# Patient Record
Sex: Female | Born: 1980 | Race: White | Smoking: Former smoker
Health system: Southern US, Community
[De-identification: ages and names within clinical notes are randomized; demographics above are authoritative.]

## PROBLEM LIST (undated history)

## (undated) DIAGNOSIS — R42 Dizziness and giddiness: Secondary | ICD-10-CM

## (undated) DIAGNOSIS — J449 Chronic obstructive pulmonary disease, unspecified: Secondary | ICD-10-CM

## (undated) DIAGNOSIS — F909 Attention-deficit hyperactivity disorder, unspecified type: Secondary | ICD-10-CM

## (undated) DIAGNOSIS — F429 Obsessive-compulsive disorder, unspecified: Secondary | ICD-10-CM

## (undated) DIAGNOSIS — K259 Gastric ulcer, unspecified as acute or chronic, without hemorrhage or perforation: Secondary | ICD-10-CM

## (undated) DIAGNOSIS — F4310 Post-traumatic stress disorder, unspecified: Secondary | ICD-10-CM

## (undated) DIAGNOSIS — K859 Acute pancreatitis without necrosis or infection, unspecified: Secondary | ICD-10-CM

## (undated) DIAGNOSIS — T7840XA Allergy, unspecified, initial encounter: Secondary | ICD-10-CM

## (undated) DIAGNOSIS — F319 Bipolar disorder, unspecified: Secondary | ICD-10-CM

## (undated) DIAGNOSIS — K219 Gastro-esophageal reflux disease without esophagitis: Secondary | ICD-10-CM

## (undated) HISTORY — DX: Dizziness and giddiness: R42

## (undated) HISTORY — DX: Allergy, unspecified, initial encounter: T78.40XA

## (undated) HISTORY — DX: Gastro-esophageal reflux disease without esophagitis: K21.9

## (undated) HISTORY — DX: Acute pancreatitis without necrosis or infection, unspecified: K85.90

## (undated) HISTORY — PX: TUBAL LIGATION: SHX77

## (undated) HISTORY — PX: BREAST ENHANCEMENT SURGERY: SHX7

## (undated) HISTORY — PX: COSMETIC SURGERY: SHX468

---

## 2017-02-06 ENCOUNTER — Other Ambulatory Visit: Payer: Self-pay

## 2017-02-06 ENCOUNTER — Emergency Department
Admission: EM | Admit: 2017-02-06 | Discharge: 2017-02-06 | Disposition: A | Discharge status: Home / Self Care | Payer: Self-pay | Attending: Emergency Medicine | Admitting: Emergency Medicine

## 2017-02-06 ENCOUNTER — Emergency Department: Payer: Self-pay

## 2017-02-06 DIAGNOSIS — Z91040 Latex allergy status: Secondary | ICD-10-CM | POA: Insufficient documentation

## 2017-02-06 DIAGNOSIS — J101 Influenza due to other identified influenza virus with other respiratory manifestations: Secondary | ICD-10-CM | POA: Insufficient documentation

## 2017-02-06 LAB — INFLUENZA PANEL BY PCR (TYPE A & B)
Influenza A By PCR: POSITIVE — AB
Influenza B By PCR: NEGATIVE

## 2017-02-06 NOTE — ED Provider Notes (Signed)
[facility] Emergency Department Provider Note  ____________________________________________  Time seen: Approximately 5:33 PM  I have reviewed the triage vital signs and the nursing notes.   HISTORY  Chief Complaint Generalized Body Aches and Cough    HPI Rebecca KearnsKristin Duffy is a 37 y.o. female presents to the emergency department with rhinorrhea, congestion, nonproductive cough, body aches and fever for the past 4 days.  Patient's daughter was recently diagnosed with influenza A.  Patient is concerned about possible pneumonia.  She denies chest pain, chest tightness, vomiting or abdominal pain.  Patient has nausea.  No alleviating measures of been attempted.   No past medical history on file.  There are no active problems to display for this patient.   Prior to Admission medications   Not on File    Allergies Penicillins and Latex  No family history on file.  Social History Social History   Tobacco Use  . Smoking status: Not on file  Substance Use Topics  . Alcohol use: Not on file  . Drug use: Not on file     Review of Systems  Constitutional: Patient has fever.  Eyes: No visual changes. No discharge ENT: Patient has congestion.  Cardiovascular: no chest pain. Respiratory: Patient has cough.  Gastrointestinal: No abdominal pain.  No nausea, no vomiting. Patient had diarrhea.  Genitourinary: Negative for dysuria. No hematuria Musculoskeletal: Patient has myalgias.  Skin: Negative for rash, abrasions, lacerations, ecchymosis. Neurological: Patient has headache, no focal weakness or numbness.     ____________________________________________   PHYSICAL EXAM:  VITAL SIGNS: ED Triage Vitals  Enc Vitals Group     BP 02/06/17 1435 (!) 139/96     Pulse Rate 02/06/17 1435 (!) 113     Resp 02/06/17 1435 20     Temp 02/06/17 1435 99.2 F (37.3 C)     Temp Source 02/06/17 1435 Oral     SpO2 02/06/17 1435 96 %     Weight 02/06/17 1438  187 lb (84.8 kg)     Height 02/06/17 1438 5\' 8"  (1.727 m)     Head Circumference --      Peak Flow --      Pain Score 02/06/17 1435 7     Pain Loc --      Pain Edu? --      Excl. in GC? --     Constitutional: Alert and oriented. Patient is lying supine. Eyes: Conjunctivae are normal. PERRL. EOMI. Head: Atraumatic. ENT:      Ears: Tympanic membranes are mildly injected with mild effusion bilaterally.       Nose: No congestion/rhinnorhea.      Mouth/Throat: Mucous membranes are moist. Posterior pharynx is mildly erythematous.  Hematological/Lymphatic/Immunilogical: No cervical lymphadenopathy.  Cardiovascular: Normal rate, regular rhythm. Normal S1 and S2.  Good peripheral circulation. Respiratory: Normal respiratory effort without tachypnea or retractions. Lungs CTAB. Good air entry to the bases with no decreased or absent breath sounds. Gastrointestinal: Bowel sounds 4 quadrants. Soft and nontender to palpation. No guarding or rigidity. No palpable masses. No distention. No CVA tenderness. Musculoskeletal: Full range of motion to all extremities. No gross deformities appreciated. Neurologic:  Normal speech and language. No gross focal neurologic deficits are appreciated.  Skin:  Skin is warm, dry and intact. No rash noted. Psychiatric: Mood and affect are normal. Speech and behavior are normal. Patient exhibits appropriate insight and judgement.  ____________________________________________   LABS (all labs ordered are listed, but only abnormal results are displayed)  Labs  Reviewed  INFLUENZA PANEL BY PCR (TYPE A & B) - Abnormal; Notable for the following components:      Result Value   Influenza A By PCR POSITIVE (*)    All other components within normal limits   ____________________________________________  EKG   ____________________________________________  RADIOLOGY Geraldo Pitter, personally viewed and evaluated these images (plain radiographs) as part of my  medical decision making, as well as reviewing the written report by the radiologist.  Dg Chest 2 View  Result Date: 02/06/2017 CLINICAL DATA:  37 year old female with flu-like symptoms for the past 6 days. Coughing and generalized chest pain for the past 2 days. EXAM: CHEST  2 VIEW COMPARISON:  No priors. FINDINGS: Lung volumes are normal. No consolidative airspace disease. No pleural effusions. No pneumothorax. No pulmonary nodule or mass noted. Pulmonary vasculature and the cardiomediastinal silhouette are within normal limits. IMPRESSION: No radiographic evidence of acute cardiopulmonary disease. Electronically Signed   By: Trudie Reed M.D.   On: 02/06/2017 16:21    ____________________________________________    PROCEDURES  Procedure(s) performed:    Procedures    Medications - No data to display   ____________________________________________   INITIAL IMPRESSION / ASSESSMENT AND PLAN / ED COURSE  Pertinent labs & imaging results that were available during my care of the patient were reviewed by me and considered in my medical decision making (see chart for details).  Review of the [place] CSRS was performed in accordance of the [facility] prior to dispensing any controlled drugs.     Assessment and plan Influenza A Patient presents to the emergency department with rhinorrhea, congestion, nonproductive cough, fever and myalgias.  Differential diagnosis included influenza versus unspecified viral URI.  Patient tested positive for influenza A in the emergency department.  Patient is outside the therapeutic window for Tamiflu at this time.  Supportive measures were encouraged.  Vital signs were reassuring prior to discharge.  All patient questions were answered.    ____________________________________________  FINAL CLINICAL IMPRESSION(S) / ED DIAGNOSES  Final diagnoses:  Influenza A      NEW MEDICATIONS STARTED DURING THIS VISIT:  ED Discharge Orders    None           This chart was dictated using voice recognition software/Dragon. Despite best efforts to proofread, errors can occur which can change the meaning. Any change was purely unintentional.    Orvil Feil, PA-C 02/06/17 1745    Sharyn Creamer, MD 02/07/17 [phone]

## 2017-02-06 NOTE — ED Notes (Addendum)
Pt states exposed to influenza a by her 37 year old daughter - feels like she has flu but states she is worried she has pneumonia since "its not getting better". States her mom has pneumonia and she wants to make sure not pneumonia. States she doesn't want tamiflu if it is flu

## 2017-02-06 NOTE — ED Triage Notes (Signed)
Body aches and cough and chest wall soreness x 4 days. Daughter was diagnosed with flu on Sunday.

## 2017-10-25 ENCOUNTER — Emergency Department: Payer: Medicaid Other

## 2017-10-25 ENCOUNTER — Encounter: Payer: Self-pay | Admitting: Emergency Medicine

## 2017-10-25 ENCOUNTER — Emergency Department
Admission: EM | Admit: 2017-10-25 | Discharge: 2017-10-25 | Disposition: A | Discharge status: Home / Self Care | Payer: Medicaid Other | Attending: Emergency Medicine | Admitting: Emergency Medicine

## 2017-10-25 DIAGNOSIS — R05 Cough: Secondary | ICD-10-CM | POA: Insufficient documentation

## 2017-10-25 DIAGNOSIS — Z91040 Latex allergy status: Secondary | ICD-10-CM | POA: Insufficient documentation

## 2017-10-25 DIAGNOSIS — R070 Pain in throat: Secondary | ICD-10-CM | POA: Insufficient documentation

## 2017-10-25 DIAGNOSIS — H9203 Otalgia, bilateral: Secondary | ICD-10-CM | POA: Insufficient documentation

## 2017-10-25 DIAGNOSIS — R0981 Nasal congestion: Secondary | ICD-10-CM | POA: Diagnosis present

## 2017-10-25 DIAGNOSIS — J40 Bronchitis, not specified as acute or chronic: Secondary | ICD-10-CM | POA: Insufficient documentation

## 2017-10-25 DIAGNOSIS — Z79899 Other long term (current) drug therapy: Secondary | ICD-10-CM | POA: Diagnosis not present

## 2017-10-25 MED ORDER — FLUTICASONE PROPIONATE 50 MCG/ACT NA SUSP: 1.0000 | Freq: Two times a day (BID) | NASAL | 0 refills | Status: DC

## 2017-10-25 MED ORDER — ALBUTEROL SULFATE HFA 108 (90 BASE) MCG/ACT IN AERS: 2.0000 | INHALATION_SPRAY | RESPIRATORY_TRACT | 0 refills | Status: DC | PRN

## 2017-10-25 MED ORDER — PREDNISONE 50 MG PO TABS: 50.0000 mg | ORAL_TABLET | Freq: Every day | ORAL | 0 refills | Status: DC

## 2017-10-25 MED ORDER — PSEUDOEPH-BROMPHEN-DM 30-2-10 MG/5ML PO SYRP: 10.0000 mL | ORAL_SOLUTION | Freq: Four times a day (QID) | ORAL | 0 refills | Status: DC | PRN

## 2017-10-25 NOTE — ED Notes (Signed)
See triage note  Presents with body aches cough  And fever  sxs' started 2 days ago  States fever at home last night was 102  Afebrile on on arrival   States she took family's doxy and inhaler   Having cough and also headache and bilateral ear pain

## 2017-10-25 NOTE — ED Provider Notes (Signed)
[facility] Emergency Department Provider Note  ____________________________________________  Time seen: Approximately 3:15 PM  I have reviewed the triage vital signs and the nursing notes.   HISTORY  Chief Complaint Cough; Fever; Nasal Congestion; and Otalgia    HPI Rebecca Duffy is a 37 y.o. female who presents the emergency department complaining of nasal congestion, sore throat, bilateral ear pain, cough.  Patient states that she has had symptoms for the past 3 to 4 days.  Symptoms began with a cough and progressed to include remaining symptoms.  Patient denies any headache, visual changes, neck pain or stiffness, chest pain, shortness of breath, abdominal pain, nausea or vomiting.  Patient tried over-the-counter medications with no relief.  A family member gave her a prescription of doxycycline as well as using Symbicort.  Patient reports that these did not improve her symptoms.  Patient reports that her fever does respond to Tylenol and Motrin.  No other complaints at this time.    History reviewed. No pertinent past medical history.  There are no active problems to display for this patient.   History reviewed. No pertinent surgical history.  Prior to Admission medications   Medication Sig Start Date End Date Taking? Authorizing Provider  albuterol (PROVENTIL HFA;VENTOLIN HFA) 108 (90 Base) MCG/ACT inhaler Inhale 2 puffs into the lungs every 4 (four) hours as needed for wheezing or shortness of breath. 10/25/17   Zoria Rawlinson, Delorise Royals, PA-C  brompheniramine-pseudoephedrine-DM 30-2-10 MG/5ML syrup Take 10 mLs by mouth 4 (four) times daily as needed. 10/25/17   Fields Oros, Delorise Royals, PA-C  fluticasone (FLONASE) 50 MCG/ACT nasal spray Place 1 spray into both nostrils 2 (two) times daily. 10/25/17   Ahaan Zobrist, Delorise Royals, PA-C  predniSONE (DELTASONE) 50 MG tablet Take 1 tablet (50 mg total) by mouth daily with breakfast. 10/25/17   Daiki Dicostanzo, Delorise Royals, PA-C     Allergies Penicillins and Latex  No family history on file.  Social History Social History   Tobacco Use  . Smoking status: Not on file  Substance Use Topics  . Alcohol use: Not on file  . Drug use: Not on file     Review of Systems  Constitutional: Positive fever/chills Eyes: No visual changes. No discharge ENT: Positive for bilateral ear pain, nasal congestion, sore throat Cardiovascular: no chest pain. Respiratory: Positive cough. No SOB. Gastrointestinal: No abdominal pain.  No nausea, no vomiting.  No diarrhea.  No constipation. Musculoskeletal: Negative for musculoskeletal pain. Skin: Negative for rash, abrasions, lacerations, ecchymosis. Neurological: Negative for headaches, focal weakness or numbness. 10-point ROS otherwise negative.  ____________________________________________   PHYSICAL EXAM:  VITAL SIGNS: ED Triage Vitals  Enc Vitals Group     BP 10/25/17 1431 110/86     Pulse Rate 10/25/17 1429 99     Resp 10/25/17 1429 20     Temp 10/25/17 1435 98.3 F (36.8 C)     Temp Source 10/25/17 1435 Oral     SpO2 10/25/17 1429 98 %     Weight 10/25/17 1430 185 lb (83.9 kg)     Height 10/25/17 1430 5\' 9"  (1.753 m)     Head Circumference --      Peak Flow --      Pain Score 10/25/17 1430 6     Pain Loc --      Pain Edu? --      Excl. in GC? --      Constitutional: Alert and oriented. Well appearing and in no acute distress. Eyes: Conjunctivae are  normal. PERRL. EOMI. Head: Atraumatic. ENT:      Ears: EACs unremarkable bilaterally.  TMs are bulging bilaterally with no injection or air-fluid level.      Nose: Moderate clear congestion/rhinnorhea.  No tenderness to percussion of the sinuses.      Mouth/Throat: Mucous membranes are moist.  Oropharynx is mildly erythematous but nonedematous.  Uvula is midline. Neck: No stridor.  Neck is supple full range of motion Hematological/Lymphatic/Immunilogical: Scattered, nontender anterior cervical  lymphadenopathy. Cardiovascular: Normal rate, regular rhythm. Normal S1 and S2.  Good peripheral circulation. Respiratory: Normal respiratory effort without tachypnea or retractions. Lungs CTAB. Good air entry to the bases with no decreased or absent breath sounds. Musculoskeletal: Full range of motion to all extremities. No gross deformities appreciated. Neurologic:  Normal speech and language. No gross focal neurologic deficits are appreciated.  Skin:  Skin is warm, dry and intact. No rash noted. Psychiatric: Mood and affect are normal. Speech and behavior are normal. Patient exhibits appropriate insight and judgement.   ____________________________________________   LABS (all labs ordered are listed, but only abnormal results are displayed)  Labs Reviewed - No data to display ____________________________________________  EKG   ____________________________________________  RADIOLOGY I personally viewed and evaluated these images as part of my medical decision making, as well as reviewing the written report by the radiologist.  I concur with radiologist finding of no acute cardiopulmonary abnormality  Dg Chest 2 View  Result Date: 10/25/2017 CLINICAL DATA:  Cough, congestion, fever for the last 2 days EXAM: CHEST - 2 VIEW COMPARISON:  Chest x-ray of 02/06/2017 FINDINGS: No active infiltrate or effusion is seen. Mediastinal and hilar contours are unremarkable. The heart is within normal limits in size. No bony abnormality is seen. A small metallic density overlying the carina on the frontal view appears to represent a superficial ornament on the lateral view anteriorly. IMPRESSION: No active cardiopulmonary disease. Electronically Signed   By: Dwyane Dee M.D.   On: 10/25/2017 16:53    ____________________________________________    PROCEDURES  Procedure(s) performed:    Procedures    Medications - No data to  display   ____________________________________________   INITIAL IMPRESSION / ASSESSMENT AND PLAN / ED COURSE  Pertinent labs & imaging results that were available during my care of the patient were reviewed by me and considered in my medical decision making (see chart for details).  Review of the  CSRS was performed in accordance of the [facility] prior to dispensing any controlled drugs.      Patient's diagnosis is consistent with bronchitis.  Patient presents the emergency department with viral symptoms.  Most consistent with viral URI versus bronchitis versus pneumonia.  Chest x-ray reveals no consolidation concerning for pneumonia.  Patient will be prescribed zone, albuterol, Flonase, Bromfed cough syrup.  Follow-up with primary care as needed. Patient is given ED precautions to return to the ED for any worsening or new symptoms.     ____________________________________________  FINAL CLINICAL IMPRESSION(S) / ED DIAGNOSES  Final diagnoses:  Bronchitis      NEW MEDICATIONS STARTED DURING THIS VISIT:  ED Discharge Orders         Ordered    albuterol (PROVENTIL HFA;VENTOLIN HFA) 108 (90 Base) MCG/ACT inhaler  Every 4 hours PRN     10/25/17 1708    predniSONE (DELTASONE) 50 MG tablet  Daily with breakfast     10/25/17 1708    fluticasone (FLONASE) 50 MCG/ACT nasal spray  2 times daily     10/25/17  1708    brompheniramine-pseudoephedrine-DM 30-2-10 MG/5ML syrup  4 times daily PRN     10/25/17 1708              This chart was dictated using voice recognition software/Dragon. Despite best efforts to proofread, errors can occur which can change the meaning. Any change was purely unintentional.    Racheal Patches, PA-C 10/25/17 1710    Minna Antis, MD 10/25/17 2336

## 2017-10-25 NOTE — ED Triage Notes (Signed)
Pt reports for the past few days has had an upper respiratory infection. Pt c.o nasal congestion, productive cough with green phlem, ear pain,  Nasal congestion and fevers. Pt states she has been taking some of her grandfathers doxycycline with no relief.

## 2018-02-17 ENCOUNTER — Emergency Department: Payer: Medicaid Other

## 2018-02-17 ENCOUNTER — Emergency Department
Admission: EM | Admit: 2018-02-17 | Discharge: 2018-02-17 | Disposition: A | Discharge status: Home / Self Care | Payer: Medicaid Other | Attending: Emergency Medicine | Admitting: Emergency Medicine

## 2018-02-17 ENCOUNTER — Encounter: Payer: Self-pay | Admitting: Emergency Medicine

## 2018-02-17 DIAGNOSIS — Z91040 Latex allergy status: Secondary | ICD-10-CM | POA: Insufficient documentation

## 2018-02-17 DIAGNOSIS — R0789 Other chest pain: Secondary | ICD-10-CM | POA: Diagnosis not present

## 2018-02-17 DIAGNOSIS — Z79899 Other long term (current) drug therapy: Secondary | ICD-10-CM | POA: Diagnosis not present

## 2018-02-17 LAB — BASIC METABOLIC PANEL
Anion gap: 9 (ref 5–15)
BUN: 14 mg/dL (ref 6–20)
CALCIUM: 9.1 mg/dL (ref 8.9–10.3)
CO2: 20 mmol/L — ABNORMAL LOW (ref 22–32)
CREATININE: 0.68 mg/dL (ref 0.44–1.00)
Chloride: 108 mmol/L (ref 98–111)
GFR calc Af Amer: >60 mL/min (ref >60)
GLUCOSE: 93 mg/dL (ref 70–99)
Potassium: 3.7 mmol/L (ref 3.5–5.1)
Sodium: 137 mmol/L (ref 135–145)

## 2018-02-17 LAB — POCT PREGNANCY, URINE: Preg Test, Ur: NEGATIVE

## 2018-02-17 LAB — CBC
HCT: 42.2 % (ref 36.0–46.0)
Hemoglobin: 14.4 g/dL (ref 12.0–15.0)
MCH: 30.4 pg (ref 26.0–34.0)
MCHC: 34.1 g/dL (ref 30.0–36.0)
MCV: 89 fL (ref 80.0–100.0)
PLATELETS: 419 10*3/uL — AB (ref 150–400)
RBC: 4.74 MIL/uL (ref 3.87–5.11)
RDW: 12.5 % (ref 11.5–15.5)
WBC: 7.2 10*3/uL (ref 4.0–10.5)
nRBC: 0 % (ref 0.0–0.2)

## 2018-02-17 LAB — TROPONIN I

## 2018-02-17 MED ORDER — CYCLOBENZAPRINE HCL 5 MG PO TABS: 5.0000 mg | ORAL_TABLET | Freq: Three times a day (TID) | ORAL | 0 refills | Status: DC | PRN
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
## 2018-12-05 ENCOUNTER — Other Ambulatory Visit: Payer: Self-pay

## 2018-12-05 ENCOUNTER — Emergency Department: Payer: Medicaid Other

## 2018-12-05 ENCOUNTER — Emergency Department
Admission: EM | Admit: 2018-12-05 | Discharge: 2018-12-05 | Disposition: A | Discharge status: Home / Self Care | Payer: Medicaid Other | Attending: Emergency Medicine | Admitting: Emergency Medicine

## 2018-12-05 ENCOUNTER — Encounter: Payer: Self-pay | Admitting: Emergency Medicine

## 2018-12-05 ENCOUNTER — Ambulatory Visit: Payer: Self-pay

## 2018-12-05 DIAGNOSIS — Z91040 Latex allergy status: Secondary | ICD-10-CM | POA: Insufficient documentation

## 2018-12-05 DIAGNOSIS — Z79899 Other long term (current) drug therapy: Secondary | ICD-10-CM | POA: Insufficient documentation

## 2018-12-05 DIAGNOSIS — N939 Abnormal uterine and vaginal bleeding, unspecified: Secondary | ICD-10-CM

## 2018-12-05 LAB — CBC
HCT: 38.1 % (ref 36.0–46.0)
Hemoglobin: 12.9 g/dL (ref 12.0–15.0)
MCH: 29.9 pg (ref 26.0–34.0)
MCHC: 33.9 g/dL (ref 30.0–36.0)
MCV: 88.4 fL (ref 80.0–100.0)
Platelets: 325 10*3/uL (ref 150–400)
RBC: 4.31 MIL/uL (ref 3.87–5.11)
RDW: 11.9 % (ref 11.5–15.5)
WBC: 6.2 10*3/uL (ref 4.0–10.5)
nRBC: 0 % (ref 0.0–0.2)

## 2018-12-05 LAB — URINALYSIS, COMPLETE (UACMP) WITH MICROSCOPIC
Bacteria, UA: NONE SEEN
Bilirubin Urine: NEGATIVE
Glucose, UA: NEGATIVE mg/dL
Ketones, ur: NEGATIVE mg/dL
Leukocytes,Ua: NEGATIVE
Nitrite: NEGATIVE
Protein, ur: NEGATIVE mg/dL
RBC / HPF: >50 RBC/hpf — ABNORMAL HIGH (ref 0–5)
Specific Gravity, Urine: 1.016 (ref 1.005–1.030)
pH: 5 (ref 5.0–8.0)

## 2018-12-05 LAB — POCT PREGNANCY, URINE: Preg Test, Ur: NEGATIVE

## 2018-12-05 NOTE — ED Triage Notes (Signed)
Pt reports has already had a menstrual cycle this month and is now bleeding again. Pt reports she started back bleeding Saturday night. Pt reports is heavy and she saw a clot this am. Pt reports her regular cycle was the first week of November.

## 2018-12-05 NOTE — Discharge Instructions (Addendum)
Follow-up with your regular doctor or Dr. Leafy Ro.  Please call for an appointment.  Return if worsening.

## 2018-12-05 NOTE — ED Notes (Signed)
See triage note  Presents with abnormal vaginal bleeding  States her normal period was the first of the month  Then developed more bleeding with some clots this weekend

## 2018-12-05 NOTE — ED Provider Notes (Signed)
[facility] Provider Note  ____________________________________________   First MD Initiated Contact with Patient 12/05/18 1150     (approximate)  I have reviewed the triage vital signs and the nursing notes.   HISTORY  Chief Complaint Vaginal Bleeding and Abdominal Pain    HPI Rebecca Duffy is a 38 y.o. female presents emergency department complaint abnormal vaginal bleeding.  States she had a period 2 weeks ago and started having heavy bleeding for 1 to 2 days.  States large clots.  Patient has had a uterine ablation previously.  History of abnormal cervical cells.  No fever or chills.  Patient has had unprotected sex.  States that her partner was seeing other people so is unsure of an STD.    History reviewed. No pertinent past medical history.  There are no active problems to display for this patient.   History reviewed. No pertinent surgical history.  Prior to Admission medications   Medication Sig Start Date End Date Taking? Authorizing Provider  albuterol (PROVENTIL HFA;VENTOLIN HFA) 108 (90 Base) MCG/ACT inhaler Inhale 2 puffs into the lungs every 4 (four) hours as needed for wheezing or shortness of breath. 10/25/17   Cuthriell, Delorise Royals, PA-C  fluticasone (FLONASE) 50 MCG/ACT nasal spray Place 1 spray into both nostrils 2 (two) times daily. 10/25/17   Cuthriell, Delorise Royals, PA-C    Allergies Penicillins and Latex  No family history on file.  Social History Social History   Tobacco Use  . Smoking status: Never Smoker  . Smokeless tobacco: Never Used  Substance Use Topics  . Alcohol use: Not Currently  . Drug use: Not Currently    Review of Systems  Constitutional: No fever/chills Eyes: No visual changes. ENT: No sore throat. Respiratory: Denies cough Genitourinary: Negative for dysuria.  Positive vaginal bleeding Musculoskeletal: Negative for back pain. Skin: Negative for rash.     ____________________________________________   PHYSICAL EXAM:  VITAL SIGNS: ED Triage Vitals [12/05/18 1019]  Enc Vitals Group     BP 128/82     Pulse Rate 84     Resp 20     Temp 99.1 F (37.3 C)     Temp Source Oral     SpO2 100 %     Weight 187 lb (84.8 kg)     Height 5\' 9"  (1.753 m)     Head Circumference      Peak Flow      Pain Score 4     Pain Loc      Pain Edu?      Excl. in [place]?     Constitutional: Alert and oriented. Well appearing and in no acute distress. Eyes: Conjunctivae are normal.  Head: Atraumatic. Nose: No congestion/rhinnorhea. Mouth/Throat: Mucous membranes are moist.   Neck:  supple no lymphadenopathy noted Cardiovascular: Normal rate, regular rhythm. Heart sounds are normal Respiratory: Normal respiratory effort.  No retractions, lungs c t a  Abd: soft tender over the uterus, bs normal all 4 quad GU: deferred Musculoskeletal: FROM all extremities, warm and well perfused Neurologic:  Normal speech and language.  Skin:  Skin is warm, dry and intact. No rash noted. Psychiatric: Mood and affect are normal. Speech and behavior are normal.  ____________________________________________   LABS (all labs ordered are listed, but only abnormal results are displayed)  Labs Reviewed  URINALYSIS, COMPLETE (UACMP) WITH MICROSCOPIC - Abnormal; Notable for the following components:      Result Value   Color, Urine YELLOW (*)  APPearance HAZY (*)    Hgb urine dipstick LARGE (*)    RBC / HPF >50 (*)    All other components within normal limits  GC/CHLAMYDIA PROBE AMP  CBC  COMPREHENSIVE METABOLIC PANEL  POC URINE PREG, ED   ____________________________________________   ____________________________________________  RADIOLOGY  Ultrasound of the pelvis is negative  ____________________________________________   PROCEDURES  Procedure(s) performed: No  Procedures    ____________________________________________   INITIAL IMPRESSION /  ASSESSMENT AND PLAN / ED COURSE  Pertinent labs & imaging results that were available during my care of the patient were reviewed by me and considered in my medical decision making (see chart for details).   Patient is a 38 year old female presents emergency department with abnormal vaginal bleeding.  See HPI  Physical exam shows patient to appear well.  Abdomen is tender over the uterus.  Remainder of exam is unremarkable  POC pregnancy is negative.  CBC is normal  DDx includes uterine fibroids, abnormal vaginal bleeding, uterine cancer, ectopic  Ultrasound of the pelvis is negative  Explained the findings to the patient.  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  She was given the phone number for Dr. Leafy Ro who is on-call.  She is to call make an appointment.  States she understands will comply.  Is discharged stable condition.   Rebecca Duffy was evaluated in Emergency Department on 12/05/2018 for the symptoms described in the history of present illness. She was evaluated in the context of the global COVID-19 pandemic, which necessitated consideration that the patient might be at risk for infection with the SARS-CoV-2 virus that causes COVID-19. Institutional protocols and algorithms that pertain to the evaluation of patients at risk for COVID-19 are in a state of rapid change based on information released by regulatory bodies including the CDC and federal and state organizations. These policies and algorithms were followed during the patient's care in the ED.   As part of my medical decision making, I reviewed the following data within the [place] notes reviewed and incorporated, Labs reviewed labs reviewed, Radiograph reviewed ultrasound, Notes from prior ED visits and [place] Controlled Substance Database  ____________________________________________   FINAL CLINICAL IMPRESSION(S) / ED DIAGNOSES  Final  diagnoses:  Abnormal vaginal bleeding      NEW MEDICATIONS STARTED DURING THIS VISIT:  New Prescriptions   No medications on file     Note:  This document was prepared using Dragon voice recognition software and may include unintentional dictation errors.    Versie Starks, PA-C 12/05/18 1418    Carrie Mew, MD 12/05/18 [phone]

## 2018-12-05 NOTE — ED Notes (Signed)
POC  Was NEGATIVE.

## 2018-12-05 NOTE — Telephone Encounter (Signed)
Patient called stating that she has had unusual cramping and vaginal bleeding since Sunday.  She states that pain is severe to her lower abdomin and back.  She just finishe a normal period 2 weeks ago.  She has had a tubal and is not pregnant per patient. She states she is bleed a lot and it is bright red.  She has had no health care for several years. Patient will go to ER for evaluation of her symptoms.  She was encouraged to call back and establish care with one of our HCP.  She verbalized understanding of all information.  Reason for Disposition . Patient sounds very sick or weak to the triager . Patient sounds very sick or weak to the triager  Answer Assessment - Initial Assessment Questions 1. LOCATION: "Where does it hurt?"      Lower abdominal cramping 2. ONSET: "When did this episode of pain begin?"       Bleeding 2 day 3. SEVERITY: "How bad is the pain?" "Are you missing school or work because of the pain?"  (e.g., Scale 1-10; mild, moderate, or severe)   - MILD (1-3): doesn't interfere with normal activities, lasting 1-2 days    - MODERATE (4-7): interferes with normal activities (missing work or school), lasting 2-3 days, some associated GI symptoms    - SEVERE (8-10): excruciating pain, lasting 2-7 days, associated GI symptoms, pain radiating into thighs and back     severe 4. VAGINAL BLEEDING: "Describe the bleeding that you are having." "How much bleeding is there?"    - SPOTTING: spotting, or pinkish / brownish mucous discharge; does not fill panti-liner or pad    - MILD:  less than 1 pad / hour; less than patient's usual menstrual bleeding   - MODERATE: 1-2 pads / hour; small-medium blood clots (e.g., pea, grape, small coin)    - SEVERE: soaking 2 or more pads/hour for 2 or more hours; bleeding not contained by pads or continuous red blood from vagina; large blood clots (e.g., golf ball, large coin)     Heavy bleeding 5. MENSTRUAL HISTORY:  "When did this menstrual period  begin?", "Is this a normal period for you?"      2 weeks ago period ended 6. LMP:  "When did your last menstrual period begin?"    2 weeks 7. OTHER SYMPTOMS: "What other symptoms are you having with the pain?" (e.g., fever, dizzy/lighthead, vomiting, diarrhea, vaginal discharge)    tired 8. PREGNANCY: "Is there any chance you are pregnant?" (e.g., unprotected intercourse, missed birth control pill, broken condom)    N/A had tubal  Answer Assessment - Initial Assessment Questions 1. AMOUNT: "Describe the bleeding that you are having."    - SPOTTING: spotting, or pinkish / brownish mucous discharge; does not fill panti-liner or pad    - MILD:  less than 1 pad / hour; less than patient's usual menstrual bleeding   - MODERATE: 1-2 pads / hour; 1 menstrual cup every 6 hours; small-medium blood clots (e.g., pea, grape, small coin)   - SEVERE: soaking 2 or more pads/hour for 2 or more hours; 1 menstrual cup every 2 hours; bleeding not contained by pads or continuous red blood from vagina; large blood clots (e.g., golf ball, large coin)      severe 2. ONSET: "When did the bleeding begin?" "Is it continuing now?"     Sunday 3. MENSTRUAL PERIOD: "When was the last normal menstrual period?" "How is this different than your period?"  normal 4. REGULARITY: "How regular are your periods?"     *No Answer* 5. ABDOMINAL PAIN: "Do you have any pain?" "How bad is the pain?"  (e.g., Scale 1-10; mild, moderate, or severe)   - MILD (1-3): doesn't interfere with normal activities, abdomen soft and not tender to touch    - MODERATE (4-7): interferes with normal activities or awakens from sleep, tender to touch    - SEVERE (8-10): excruciating pain, doubled over, unable to do any normal activities    severe 6. PREGNANCY: "Could you be pregnant?" "Are you sexually active?" "Did you recently give birth?"    No tubal 7. BREASTFEEDING: "Are you breastfeeding?"    N/A 8. HORMONES: "Are you taking any hormone  medications, prescription or OTC?" (e.g., birth control pills, estrogen)     *No Answer* 9. BLOOD THINNERS: "Do you take any blood thinners?" (e.g., Coumadin/warfarin, Pradaxa/dabigatran, aspirin)     *No Answer* 10. CAUSE: "What do you think is causing the bleeding?" (e.g., recent gyn surgery, recent gyn procedure; known bleeding disorder, cervical cancer, polycystic ovarian disease, fibroids)         *No Answer* 11. HEMODYNAMIC STATUS: "Are you weak or feeling lightheaded?" If so, ask: "Can you stand and walk normally?"      tired 12. OTHER SYMPTOMS: "What other symptoms are you having with the bleeding?" (e.g., passed tissue, vaginal discharge, fever, menstrual-type cramps)      Manson Passey looking  Protocols used: ABDOMINAL PAIN - MENSTRUAL CRAMPS-A-AH, VAGINAL BLEEDING - ABNORMAL-A-AH

## 2019-02-28 ENCOUNTER — Ambulatory Visit
Admission: EM | Admit: 2019-02-28 | Discharge: 2019-02-28 | Disposition: A | Discharge status: Home / Self Care | Payer: Medicaid Other | Attending: Emergency Medicine | Admitting: Emergency Medicine

## 2019-02-28 ENCOUNTER — Other Ambulatory Visit: Payer: Self-pay

## 2019-02-28 ENCOUNTER — Encounter: Payer: Self-pay | Admitting: Emergency Medicine

## 2019-02-28 DIAGNOSIS — H6693 Otitis media, unspecified, bilateral: Secondary | ICD-10-CM

## 2019-02-28 HISTORY — DX: Post-traumatic stress disorder, unspecified: F43.10

## 2019-02-28 HISTORY — DX: Attention-deficit hyperactivity disorder, unspecified type: F90.9

## 2019-02-28 HISTORY — DX: Obsessive-compulsive disorder, unspecified: F42.9

## 2019-02-28 HISTORY — DX: Bipolar disorder, unspecified: F31.9

## 2019-02-28 MED ORDER — AZITHROMYCIN 250 MG PO TABS: 250.0000 mg | ORAL_TABLET | Freq: Every day | ORAL | 0 refills | Status: DC

## 2019-02-28 NOTE — ED Triage Notes (Signed)
Patient in today c/o bilateral ear pain. L>R and left sided neck pain x 2 weeks, worse in the past 2-3 days. Patient states it is also causing her to be dizzy.

## 2019-02-28 NOTE — ED Provider Notes (Signed)
Renaldo Fiddler    CSN: [id number] Arrival date & time: 02/28/19  1532      History   Chief Complaint Chief Complaint  Patient presents with  . Otalgia  . Neck Pain    HPI Rebecca Duffy is a 39 y.o. female.   Patient presents with bilateral ear pain x2 weeks, worse x3 days.  She states the left ear hurts worse than the right.  She reports associated dizziness intermittently and pain in the left side of her neck from a swollen lymph node.  She denies fever, chills, sore throat, cough, shortness of breath, vomiting, diarrhea, rash, or other symptoms.  No treatments attempted at home.      The history is provided by the patient.    Past Medical History:  Diagnosis Date  . ADHD   . Bipolar 1 disorder (HCC)   . OCD (obsessive compulsive disorder)   . PTSD (post-traumatic stress disorder)     There are no problems to display for this patient.   Past Surgical History:  Procedure Laterality Date  . BREAST ENHANCEMENT SURGERY    . CESAREAN SECTION    . TUBAL LIGATION      OB History   No obstetric history on file.      Home Medications    Prior to Admission medications   Medication Sig Start Date End Date Taking? Authorizing Provider  BIOTIN PO Take 1 tablet by mouth daily.   Yes [provider]  IRON, FERROUS SULFATE, PO Take 1 tablet by mouth daily.   Yes [provider]  MAGNESIUM PO Take 1 tablet by mouth daily.   Yes [provider]  Multiple Vitamins-Minerals (ZINC PO) Take 1 tablet by mouth daily.   Yes [provider]  Omega-3 Fatty Acids (FISH OIL PO) Take 1 tablet by mouth daily.   Yes [provider]  UNABLE TO FIND Bladderwrack   Yes [provider]  albuterol (PROVENTIL HFA;VENTOLIN HFA) 108 (90 Base) MCG/ACT inhaler Inhale 2 puffs into the lungs every 4 (four) hours as needed for wheezing or shortness of breath. 10/25/17   Cuthriell, Delorise Royals, PA-C  azithromycin (ZITHROMAX) 250 MG tablet Take  1 tablet (250 mg total) by mouth daily. Take first 2 tablets together, then 1 every day until finished. 02/28/19   Mickie Bail, NP  fluticasone (FLONASE) 50 MCG/ACT nasal spray Place 1 spray into both nostrils 2 (two) times daily. 10/25/17   Cuthriell, Delorise Royals, PA-C    Family History Family History  Problem Relation Age of Onset  . Healthy Mother   . Other Father        unknown medical history    Social History Social History   Tobacco Use  . Smoking status: Never Smoker  . Smokeless tobacco: Never Used  Substance Use Topics  . Alcohol use: Not Currently  . Drug use: Yes    Frequency: 2.0 times per week    Types: Marijuana     Allergies   Penicillins and Latex   Review of Systems Review of Systems  Constitutional: Negative for chills and fever.  HENT: Positive for ear pain. Negative for congestion, rhinorrhea and sore throat.   Eyes: Negative for pain and visual disturbance.  Respiratory: Negative for cough and shortness of breath.   Cardiovascular: Negative for chest pain and palpitations.  Gastrointestinal: Negative for abdominal pain, diarrhea, nausea and vomiting.  Genitourinary: Negative for dysuria and hematuria.  Musculoskeletal: Positive for neck pain. Negative for arthralgias  and back pain.  Skin: Negative for color change and rash.  Neurological: Positive for dizziness. Negative for seizures, syncope, weakness and numbness.  All other systems reviewed and are negative.    Physical Exam Triage Vital Signs ED Triage Vitals  Enc Vitals Group     BP      Pulse      Resp      Temp      Temp src      SpO2      Weight      Height      Head Circumference      Peak Flow      Pain Score      Pain Loc      Pain Edu?      Excl. in [place]?    No data found.  Updated Vital Signs BP 121/82 (BP Location: Left Arm)   Pulse (!) 105   Temp 99.3 F (37.4 C) (Oral)   Resp 18   Ht 5\' 9"  (1.753 m)   Wt 196 lb (88.9 kg)   LMP 02/26/2019 (Exact Date)   SpO2  96%   BMI 28.94 kg/m   Visual Acuity Right Eye Distance:   Left Eye Distance:   Bilateral Distance:    Right Eye Near:   Left Eye Near:    Bilateral Near:     Physical Exam Vitals and nursing note reviewed.  Constitutional:      General: She is not in acute distress.    Appearance: She is well-developed. She is not ill-appearing.  HENT:     Head: Normocephalic and atraumatic.     Right Ear: Ear canal normal. Tympanic membrane is erythematous.     Left Ear: Ear canal normal. Tympanic membrane is erythematous.     Nose: Nose normal.     Mouth/Throat:     Mouth: Mucous membranes are moist.     Pharynx: Oropharynx is clear.  Eyes:     Conjunctiva/sclera: Conjunctivae normal.  Cardiovascular:     Rate and Rhythm: Normal rate and regular rhythm.     Heart sounds: No murmur.  Pulmonary:     Effort: Pulmonary effort is normal. No respiratory distress.     Breath sounds: Normal breath sounds.  Abdominal:     General: Bowel sounds are normal.     Palpations: Abdomen is soft.     Tenderness: There is no abdominal tenderness. There is no guarding or rebound.  Musculoskeletal:     Cervical back: Neck supple.  Skin:    General: Skin is warm and dry.     Findings: No rash.  Neurological:     General: No focal deficit present.     Mental Status: She is alert and oriented to person, place, and time.  Psychiatric:        Mood and Affect: Mood normal.        Behavior: Behavior normal.      UC Treatments / Results  Labs (all labs ordered are listed, but only abnormal results are displayed) Labs Reviewed - No data to display  EKG   Radiology No results found.  Procedures Procedures (including critical care time)  Medications Ordered in UC Medications - No data to display  Initial Impression / Assessment and Plan / UC Course  I have reviewed the triage vital signs and the nursing notes.  Pertinent labs & imaging results that were available during my care of the  patient were reviewed by me and considered  in my medical decision making (see chart for details).   Bilateral otitis media.  Treating with Zithromax.  Instructed patient to take Tylenol or ibuprofen as needed for discomfort.  Instructed her to follow-up with her PCP if her symptoms or not improving.  Patient agrees to plan of care.     Final Clinical Impressions(s) / UC Diagnoses   Final diagnoses:  Bilateral otitis media, unspecified otitis media type     Discharge Instructions     Take the antibiotic as directed.  Take ibuprofen or Tylenol as needed for pain.    Follow up with your primary care provider if your symptoms are not improving.        ED Prescriptions    Medication Sig Dispense Auth. Provider   azithromycin (ZITHROMAX) 250 MG tablet Take 1 tablet (250 mg total) by mouth daily. Take first 2 tablets together, then 1 every day until finished. 6 tablet Mickie Bail, NP     I have reviewed the PDMP during this encounter.   Mickie Bail, NP 02/28/19 1555

## 2019-02-28 NOTE — Discharge Instructions (Signed)
Take the antibiotic as directed.  Take ibuprofen or Tylenol as needed for pain.    Follow up with your primary care provider if your symptoms are not improving.

## 2019-03-07 ENCOUNTER — Other Ambulatory Visit: Payer: Self-pay

## 2019-03-07 ENCOUNTER — Ambulatory Visit
Admission: EM | Admit: 2019-03-07 | Discharge: 2019-03-07 | Disposition: A | Discharge status: Home / Self Care | Payer: Medicaid Other | Attending: Emergency Medicine | Admitting: Emergency Medicine

## 2019-03-07 ENCOUNTER — Encounter: Payer: Self-pay | Admitting: Emergency Medicine

## 2019-03-07 DIAGNOSIS — H6692 Otitis media, unspecified, left ear: Secondary | ICD-10-CM

## 2019-03-07 MED ORDER — AZITHROMYCIN 250 MG PO TABS: 250.0000 mg | ORAL_TABLET | Freq: Every day | ORAL | 0 refills | Status: DC

## 2019-03-07 NOTE — ED Triage Notes (Signed)
Pt c/o bilateral ear pain, neck pain and vertigo. Left worse than right. She was seen on 02/28/19 for this and treated for ear infection but she states the ears are not much better, her neck pain is not better at all and her vertigo is worse. She is not taking her meclizine because she does not like that it makes her sleepy. She completed the antibiotic, she took her last pill 3 days ago.

## 2019-03-07 NOTE — Discharge Instructions (Signed)
Take the Zithromax as directed.    Change positions slowly and take your meclizine if needed for your vertigo.    Follow-up with your primary care provider if your symptoms are not improving.

## 2019-03-07 NOTE — ED Provider Notes (Signed)
Renaldo Fiddler    CSN: [id number] Arrival date & time: 03/07/19  1109      History   Chief Complaint Chief Complaint  Patient presents with  . Otalgia    bilateral    HPI Rebecca Duffy is a 39 y.o. female.   Patient presents with ongoing left ear pain x3 weeks.  She also reports ongoing intermittent vertigo and states she has meclizine at home to treat this but has not been taking it.  She also reports ongoing enlarged lymph node on the left side of her neck.  She denies fever, chills, sore throat, cough, shortness of breath, rash, or other symptoms.  Patient was seen here on 02/28/2019 and treated for bilateral otitis media with Zithromax (she is allergic to penicillins); she reports partial improvement of her symptoms with this medication.  The history is provided by the patient.    Past Medical History:  Diagnosis Date  . ADHD   . Bipolar 1 disorder (HCC)   . OCD (obsessive compulsive disorder)   . PTSD (post-traumatic stress disorder)     There are no problems to display for this patient.   Past Surgical History:  Procedure Laterality Date  . BREAST ENHANCEMENT SURGERY    . CESAREAN SECTION    . TUBAL LIGATION      OB History   No obstetric history on file.      Home Medications    Prior to Admission medications   Medication Sig Start Date End Date Taking? Authorizing Provider  albuterol (PROVENTIL HFA;VENTOLIN HFA) 108 (90 Base) MCG/ACT inhaler Inhale 2 puffs into the lungs every 4 (four) hours as needed for wheezing or shortness of breath. 10/25/17  Yes Cuthriell, Delorise Royals, PA-C  BIOTIN PO Take 1 tablet by mouth daily.   Yes [provider]  fluticasone (FLONASE) 50 MCG/ACT nasal spray Place 1 spray into both nostrils 2 (two) times daily. 10/25/17  Yes Cuthriell, Delorise Royals, PA-C  IRON, FERROUS SULFATE, PO Take 1 tablet by mouth daily.   Yes [provider]  MAGNESIUM PO Take 1 tablet by mouth daily.   Yes [provider]    Multiple Vitamins-Minerals (ZINC PO) Take 1 tablet by mouth daily.   Yes [provider]  Omega-3 Fatty Acids (FISH OIL PO) Take 1 tablet by mouth daily.   Yes [provider]  UNABLE TO FIND Bladderwrack   Yes [provider]  azithromycin (ZITHROMAX) 250 MG tablet Take 1 tablet (250 mg total) by mouth daily. Take first 2 tablets together, then 1 every day until finished. 03/07/19   Mickie Bail, NP    Family History Family History  Problem Relation Age of Onset  . Healthy Mother   . Other Father        unknown medical history    Social History Social History   Tobacco Use  . Smoking status: Never Smoker  . Smokeless tobacco: Never Used  Substance Use Topics  . Alcohol use: Not Currently  . Drug use: Yes    Frequency: 2.0 times per week    Types: Marijuana     Allergies   Penicillins and Latex   Review of Systems Review of Systems  Constitutional: Negative for chills and fever.  HENT: Positive for ear pain. Negative for congestion, rhinorrhea and sore throat.   Eyes: Negative for pain and visual disturbance.  Respiratory: Negative for cough and shortness of breath.   Cardiovascular: Negative for chest pain and palpitations.  Gastrointestinal:  Negative for abdominal pain, diarrhea, nausea and vomiting.  Genitourinary: Negative for dysuria and hematuria.  Musculoskeletal: Negative for arthralgias and back pain.  Skin: Negative for color change and rash.  Neurological: Positive for dizziness. Negative for seizures, syncope, weakness, numbness and headaches.  All other systems reviewed and are negative.    Physical Exam Triage Vital Signs ED Triage Vitals  Enc Vitals Group     BP      Pulse      Resp      Temp      Temp src      SpO2      Weight      Height      Head Circumference      Peak Flow      Pain Score      Pain Loc      Pain Edu?      Excl. in GC?    No data found.  Updated Vital Signs BP 119/78 (BP Location:  Left Arm)   Pulse 96   Temp 98 F (36.7 C) (Oral)   Resp 18   Wt 195 lb 15.8 oz (88.9 kg)   LMP 02/26/2019 (Exact Date)   SpO2 97%   BMI 28.94 kg/m   Visual Acuity Right Eye Distance:   Left Eye Distance:   Bilateral Distance:    Right Eye Near:   Left Eye Near:    Bilateral Near:     Physical Exam Vitals and nursing note reviewed.  Constitutional:      General: She is not in acute distress.    Appearance: She is well-developed. She is not ill-appearing.  HENT:     Head: Normocephalic and atraumatic.     Right Ear: Tympanic membrane normal.     Left Ear: Tympanic membrane is erythematous.     Ears:     Comments: Left TM mildly erythematous; improved from previous exam.     Nose: Nose normal.     Mouth/Throat:     Mouth: Mucous membranes are moist.     Pharynx: Oropharynx is clear.  Eyes:     Conjunctiva/sclera: Conjunctivae normal.  Cardiovascular:     Rate and Rhythm: Normal rate and regular rhythm.     Heart sounds: No murmur.  Pulmonary:     Effort: Pulmonary effort is normal. No respiratory distress.     Breath sounds: Normal breath sounds.  Abdominal:     General: Bowel sounds are normal.     Palpations: Abdomen is soft.     Tenderness: There is no abdominal tenderness. There is no guarding or rebound.  Musculoskeletal:     Cervical back: Neck supple.  Skin:    General: Skin is warm and dry.     Findings: No rash.  Neurological:     General: No focal deficit present.     Mental Status: She is alert and oriented to person, place, and time.  Psychiatric:        Mood and Affect: Mood normal.        Behavior: Behavior normal.      UC Treatments / Results  Labs (all labs ordered are listed, but only abnormal results are displayed) Labs Reviewed - No data to display  EKG   Radiology No results found.  Procedures Procedures (including critical care time)  Medications Ordered in UC Medications - No data to display  Initial Impression /  Assessment and Plan / UC Course  I have reviewed the triage vital  signs and the nursing notes.  Pertinent labs & imaging results that were available during my care of the patient were reviewed by me and considered in my medical decision making (see chart for details).    Left otitis media.  Treating with second round of Zithromax and this has almost cleared up her OM.  Directed patient to change positions slowly and take her meclizine as needed for vertigo.  Instructed her to follow-up with her PCP if her symptoms are not improving.  Patient agrees to plan of care.     Final Clinical Impressions(s) / UC Diagnoses   Final diagnoses:  Left otitis media, unspecified otitis media type     Discharge Instructions     Take the Zithromax as directed.    Change positions slowly and take your meclizine if needed for your vertigo.    Follow-up with your primary care provider if your symptoms are not improving.          ED Prescriptions    Medication Sig Dispense Auth. Provider   azithromycin (ZITHROMAX) 250 MG tablet Take 1 tablet (250 mg total) by mouth daily. Take first 2 tablets together, then 1 every day until finished. 6 tablet Sharion Balloon, NP     PDMP not reviewed this encounter.   Sharion Balloon, NP 03/07/19 [phone]

## 2019-10-29 ENCOUNTER — Ambulatory Visit (HOSPITAL_COMMUNITY)
Admission: EM | Admit: 2019-10-29 | Discharge: 2019-10-30 | Disposition: A | Discharge status: Home / Self Care | Payer: Medicaid Other | Attending: Family | Admitting: Family

## 2019-10-29 ENCOUNTER — Other Ambulatory Visit: Payer: Self-pay

## 2019-10-29 DIAGNOSIS — F329 Major depressive disorder, single episode, unspecified: Secondary | ICD-10-CM | POA: Diagnosis not present

## 2019-10-29 DIAGNOSIS — F429 Obsessive-compulsive disorder, unspecified: Secondary | ICD-10-CM | POA: Insufficient documentation

## 2019-10-29 DIAGNOSIS — F1220 Cannabis dependence, uncomplicated: Secondary | ICD-10-CM | POA: Insufficient documentation

## 2019-10-29 DIAGNOSIS — Z20822 Contact with and (suspected) exposure to covid-19: Secondary | ICD-10-CM | POA: Insufficient documentation

## 2019-10-29 DIAGNOSIS — F1020 Alcohol dependence, uncomplicated: Secondary | ICD-10-CM | POA: Diagnosis not present

## 2019-10-29 DIAGNOSIS — F4310 Post-traumatic stress disorder, unspecified: Secondary | ICD-10-CM | POA: Insufficient documentation

## 2019-10-29 DIAGNOSIS — F1994 Other psychoactive substance use, unspecified with psychoactive substance-induced mood disorder: Secondary | ICD-10-CM

## 2019-10-29 DIAGNOSIS — F3130 Bipolar disorder, current episode depressed, mild or moderate severity, unspecified: Secondary | ICD-10-CM | POA: Insufficient documentation

## 2019-10-29 LAB — TSH: TSH: 2.042 u[IU]/mL (ref 0.350–4.500)

## 2019-10-29 LAB — POCT PREGNANCY, URINE: Preg Test, Ur: NEGATIVE

## 2019-10-29 LAB — LIPID PANEL
Cholesterol: 221 mg/dL — ABNORMAL HIGH (ref 0–200)
HDL: 83 mg/dL (ref >40)
LDL Cholesterol: 114 mg/dL — ABNORMAL HIGH (ref 0–99)
Total CHOL/HDL Ratio: 2.7 RATIO
Triglycerides: 122 mg/dL (ref <150)
VLDL: 24 mg/dL (ref 0–40)

## 2019-10-29 LAB — POCT URINE DRUG SCREEN - MANUAL ENTRY (I-SCREEN)
POC Amphetamine UR: NOT DETECTED
POC Buprenorphine (BUP): NOT DETECTED
POC Cocaine UR: POSITIVE — AB
POC Marijuana UR: POSITIVE — AB
POC Methadone UR: NOT DETECTED
POC Methamphetamine UR: NOT DETECTED
POC Morphine: NOT DETECTED
POC Oxazepam (BZO): NOT DETECTED
POC Oxycodone UR: NOT DETECTED
POC Secobarbital (BAR): NOT DETECTED

## 2019-10-29 LAB — CBC WITH DIFFERENTIAL/PLATELET
Abs Immature Granulocytes: 0.04 10*3/uL (ref 0.00–0.07)
Basophils Absolute: 0.1 10*3/uL (ref 0.0–0.1)
Basophils Relative: 1 %
Eosinophils Absolute: 0.2 10*3/uL (ref 0.0–0.5)
Eosinophils Relative: 1 %
HCT: 43.4 % (ref 36.0–46.0)
Hemoglobin: 14.9 g/dL (ref 12.0–15.0)
Immature Granulocytes: 0 %
Lymphocytes Relative: 31 %
Lymphs Abs: 3.7 10*3/uL (ref 0.7–4.0)
MCH: 31 pg (ref 26.0–34.0)
MCHC: 34.3 g/dL (ref 30.0–36.0)
MCV: 90.2 fL (ref 80.0–100.0)
Monocytes Absolute: 0.9 10*3/uL (ref 0.1–1.0)
Monocytes Relative: 8 %
Neutro Abs: 7.3 10*3/uL (ref 1.7–7.7)
Neutrophils Relative %: 59 %
Platelets: 530 10*3/uL — ABNORMAL HIGH (ref 150–400)
RBC: 4.81 MIL/uL (ref 3.87–5.11)
RDW: 12.2 % (ref 11.5–15.5)
WBC: 12.2 10*3/uL — ABNORMAL HIGH (ref 4.0–10.5)
nRBC: 0 % (ref 0.0–0.2)

## 2019-10-29 LAB — COMPREHENSIVE METABOLIC PANEL
ALT: 21 U/L (ref 0–44)
AST: 19 U/L (ref 15–41)
Albumin: 4.6 g/dL (ref 3.5–5.0)
Alkaline Phosphatase: 93 U/L (ref 38–126)
Anion gap: 15 (ref 5–15)
BUN: 7 mg/dL (ref 6–20)
CO2: 20 mmol/L — ABNORMAL LOW (ref 22–32)
Calcium: 9.5 mg/dL (ref 8.9–10.3)
Chloride: 101 mmol/L (ref 98–111)
Creatinine, Ser: 0.77 mg/dL (ref 0.44–1.00)
GFR, Estimated: >60 mL/min (ref >60)
Glucose, Bld: 82 mg/dL (ref 70–99)
Potassium: 3.7 mmol/L (ref 3.5–5.1)
Sodium: 136 mmol/L (ref 135–145)
Total Bilirubin: 1.6 mg/dL — ABNORMAL HIGH (ref 0.3–1.2)
Total Protein: 7.5 g/dL (ref 6.5–8.1)

## 2019-10-29 LAB — MAGNESIUM: Magnesium: 2.2 mg/dL (ref 1.7–2.4)

## 2019-10-29 LAB — RESPIRATORY PANEL BY RT PCR (FLU A&B, COVID)
Influenza A by PCR: NEGATIVE
Influenza B by PCR: NEGATIVE
SARS Coronavirus 2 by RT PCR: NEGATIVE

## 2019-10-29 LAB — ETHANOL: Alcohol, Ethyl (B): 18 mg/dL — ABNORMAL HIGH (ref <10)

## 2019-10-29 LAB — GLUCOSE, CAPILLARY: Glucose-Capillary: 87 mg/dL (ref 70–99)

## 2019-10-29 LAB — HEMOGLOBIN A1C
Hgb A1c MFr Bld: 5.1 % (ref 4.8–5.6)
Mean Plasma Glucose: 99.67 mg/dL

## 2019-10-29 LAB — POC SARS CORONAVIRUS 2 AG -  ED: SARS Coronavirus 2 Ag: NEGATIVE

## 2019-10-29 MED ORDER — CARIPRAZINE HCL 1.5 MG PO CAPS
1.5000 mg | ORAL_CAPSULE | Freq: Every day | ORAL | Status: DC
  Administered 2019-10-29 – 2019-10-30 (×2): 1.5 mg via ORAL
  Filled 2019-10-29 (×2): qty 1

## 2019-10-29 MED ORDER — ALUM & MAG HYDROXIDE-SIMETH 200-200-20 MG/5ML PO SUSP: 30.0000 mL | ORAL | Status: DC | PRN

## 2019-10-29 MED ORDER — LORAZEPAM 1 MG PO TABS: 1.0000 mg | ORAL_TABLET | Freq: Every day | ORAL | Status: DC

## 2019-10-29 MED ORDER — THIAMINE HCL 100 MG PO TABS: 100.0000 mg | ORAL_TABLET | Freq: Every day | ORAL | Status: DC

## 2019-10-29 MED ORDER — PRAZOSIN HCL 1 MG PO CAPS
1.0000 mg | ORAL_CAPSULE | Freq: Every day | ORAL | Status: DC
  Administered 2019-10-29: 1 mg via ORAL
  Filled 2019-10-29: qty 1

## 2019-10-29 MED ORDER — LORAZEPAM 1 MG PO TABS
1.0000 mg | ORAL_TABLET | Freq: Four times a day (QID) | ORAL | Status: DC
  Administered 2019-10-29 (×3): 1 mg via ORAL
  Filled 2019-10-29 (×3): qty 1

## 2019-10-29 MED ORDER — LOPERAMIDE HCL 2 MG PO CAPS: 2.0000 mg | ORAL_CAPSULE | ORAL | Status: DC | PRN

## 2019-10-29 MED ORDER — HYDROXYZINE HCL 25 MG PO TABS: 25.0000 mg | ORAL_TABLET | Freq: Three times a day (TID) | ORAL | Status: DC | PRN

## 2019-10-29 MED ORDER — THIAMINE HCL 100 MG/ML IJ SOLN
100.0000 mg | Freq: Once | INTRAMUSCULAR | Status: DC
  Filled 2019-10-29: qty 2

## 2019-10-29 MED ORDER — TRAZODONE HCL 50 MG PO TABS
50.0000 mg | ORAL_TABLET | Freq: Every evening | ORAL | Status: DC | PRN
  Administered 2019-10-29: 50 mg via ORAL
  Filled 2019-10-29: qty 1

## 2019-10-29 MED ORDER — THIAMINE HCL 100 MG PO TABS
100.0000 mg | ORAL_TABLET | Freq: Every day | ORAL | Status: DC
  Administered 2019-10-29 – 2019-10-30 (×2): 100 mg via ORAL
  Filled 2019-10-29 (×2): qty 1

## 2019-10-29 MED ORDER — ONDANSETRON 4 MG PO TBDP: 4.0000 mg | ORAL_TABLET | Freq: Four times a day (QID) | ORAL | Status: DC | PRN

## 2019-10-29 MED ORDER — LORAZEPAM 1 MG PO TABS: 1.0000 mg | ORAL_TABLET | Freq: Two times a day (BID) | ORAL | Status: DC

## 2019-10-29 MED ORDER — ACETAMINOPHEN 325 MG PO TABS: 650.0000 mg | ORAL_TABLET | Freq: Four times a day (QID) | ORAL | Status: DC | PRN

## 2019-10-29 MED ORDER — ADULT MULTIVITAMIN W/MINERALS CH
1.0000 | ORAL_TABLET | Freq: Every day | ORAL | Status: DC
  Administered 2019-10-29 – 2019-10-30 (×2): 1 via ORAL
  Filled 2019-10-29 (×2): qty 1

## 2019-10-29 MED ORDER — MAGNESIUM HYDROXIDE 400 MG/5ML PO SUSP: 30.0000 mL | Freq: Every day | ORAL | Status: DC | PRN

## 2019-10-29 MED ORDER — LORAZEPAM 1 MG PO TABS: 1.0000 mg | ORAL_TABLET | Freq: Four times a day (QID) | ORAL | Status: DC | PRN

## 2019-10-29 MED ORDER — LORAZEPAM 1 MG PO TABS: 1.0000 mg | ORAL_TABLET | Freq: Three times a day (TID) | ORAL | Status: DC

## 2019-10-29 NOTE — ED Notes (Signed)
Pt A&O x 4, no distress noted, comfort measures given.  Denies SI.  Pt calm & cooperative, monitoring for safety.

## 2019-10-29 NOTE — ED Notes (Signed)
Pt presented tearful, sad, and depressed after being "tired of drinking". Pt reports she drinks too much alcohol and smokes marijuana. Pt stated she is stressed about having to move out of her parents home by February. Pt alert and oriented during admission and denies SI/HI, A/VH. Pt is cooperative on the unit. Education, support, reassurance, and encouragement provided. Mother took pt's belongings home.Pt denies any concerns at this time, and verbally contracts for safety. Pt ambulating on the unit with no issues. Pt remains safe on the unit.

## 2019-10-29 NOTE — ED Provider Notes (Signed)
Behavioral Health Admission H&P [facility] & [facility]BS)  Date: 10/29/19 Patient Name: Rebecca Duffy MRN: [id number] Chief Complaint:  Chief Complaint  Patient presents with  . Depression  . Addiction Problem      Diagnoses:  Final diagnoses:  Substance induced mood disorder (HCC)  Alcohol use disorder, moderate, dependence (HCC)    HPI: Patient presents voluntarily to [facility] behavioral [facility] center for walk-in assessment.  Patient states "I lost control of my drinking this weekend, I was drinking and using cocaine all weekend and I haven't eaten in three days." Patient reports "I am really upset with myself." Patient reports increasing frequency of alcohol use over recent months. Patient went from occasional alcohol use to recently using alcohol on most days. 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 Patient reports cocaine use x last 3 days.   Patient reports readiness to address alcohol and substance use disorders.  Patient since "I want to start going to AA meetings because when I get stressed out report to start drinking, I know I have a drinking problem."  Patient states "I am tired of being sad and depressed and I am tired of being an alcoholic."  Patient reports current stressor includes "the only friends I have want to party all the time and get drunk."  Patient reports current stressors include her stepfather who has informed her that she must move out of the family home by February.  Patient denies access to weapons.  Patient reports feeling pressure to "figure something out by February."  Patient reports she currently lives at home with her mother, stepfather and 2 daughters ages 9 and 105 years old.  Patient reports she is currently working part-time as she is unable to work full-time because she is trying to get disability.  Patient reports she is seeking disability through her attorney related to  her mental health disorders currently.  Patient reports she has been diagnosed with bipolar disorder, PTSD, ADHD and night terrors.  [facility]  Patient reports she does not currently see a counselor but would consider initiating counseling moving forward.  Patient reports she does not feel like her medications are currently adequate however admits she does not take her medications when she uses substances nor when she drinks alcohol.  Patient reports she has been committed for inpatient psychiatric treatment "a few times" in the past.  Last admission in [facility] [place] several years ago. Patient reports current medications include Vraylar, and prazosin.  Patient reports she frequently Forgets to take her medications.  Patient assessed by nurse practitioner.  Patient alert and oriented, answers appropriately.  Patient tearful on assessment.  Patient states "I feel helpless, and know that I want to stop drinking."  Patient denies suicidal ideations.  Patient denies history of suicide attempts.  Patient denies history of self-harm behaviors.  Patient denies homicidal ideations.  Patient denies both auditory and visual hallucinations.  There is no evidence of delusional thought content and patient does not appear to be responding to internal stimuli.  Patient denies symptoms of paranoia.  Patient offered support and encouragement.     PHQ 2-9:     Total Time spent with patient: 30 minutes  Musculoskeletal  Strength & Muscle Tone: within normal limits Gait & Station: normal Patient leans: N/A  Psychiatric Specialty Exam  Presentation General Appearance: Appropriate for Environment  Eye Contact:Good  Speech:Clear and Coherent;Normal Rate  Speech Volume:Normal  Handedness:Right   Mood and Affect  Mood:Depressed;Hopeless  Affect:Depressed;Tearful   Thought Process  Thought Processes:Coherent;Goal  Directed  Descriptions of Associations:Intact  Orientation:Full (Time, Place and Person)  Thought Content:Logical;WDL  Hallucinations:Hallucinations: None  Ideas of Reference:None  Suicidal Thoughts:Suicidal Thoughts: No  Homicidal Thoughts:Homicidal Thoughts: No   Sensorium  Memory:Immediate Good;Recent Good;Remote Good  Judgment:Fair  Insight:Good   Executive Functions  Concentration:Good  Attention Span:Good  Recall:Good  Fund of Knowledge:Good  Language:Good   Psychomotor Activity  Psychomotor Activity:Psychomotor Activity: Normal   Assets  Assets:Communication Skills;Desire for Improvement;Financial Resources/Insurance;Intimacy;Housing;Leisure Time;Physical Health;Resilience;Social Support   Sleep  Sleep:Sleep: Fair   Physical Exam Vitals and nursing note reviewed.  Constitutional:      Appearance: She is well-developed.  HENT:     Head: Normocephalic.  Cardiovascular:     Rate and Rhythm: Normal rate.  Pulmonary:     Effort: Pulmonary effort is normal.  Neurological:     Mental Status: She is alert and oriented to person, place, and time.  Psychiatric:        Attention and Perception: Attention and perception normal.        Mood and Affect: Mood is depressed. Affect is tearful.        Speech: Speech normal.        Behavior: Behavior normal. Behavior is cooperative.        Thought Content: Thought content normal.        Cognition and Memory: Cognition and memory normal.        Judgment: Judgment normal.    Review of Systems  Constitutional: Negative.   HENT: Negative.   Eyes: Negative.   Respiratory: Negative.   Cardiovascular: Negative.   Gastrointestinal: Negative.   Genitourinary: Negative.   Musculoskeletal: Negative.   Skin: Negative.   Neurological: Negative.   Endo/Heme/Allergies: Negative.   Psychiatric/Behavioral: Positive for depression and substance abuse.    Blood pressure (!) 142/99, pulse 94, temperature 98.4 F  (36.9 C), temperature source Tympanic, height 5\' 9"  (1.753 m), weight 198 lb (89.8 kg), SpO2 98 %. Body mass index is 29.24 kg/m.  Past Psychiatric History: Bipolar disorder, PTSD, night terrors, ADHD  Is the patient at risk to self? No  Has the patient been a risk to self in the past 6 months? No .    Has the patient been a risk to self within the distant past? No   Is the patient a risk to others? No   Has the patient been a risk to others in the past 6 months? No   Has the patient been a risk to others within the distant past? No   Past Medical History:  Past Medical History:  Diagnosis Date  . ADHD   . Bipolar 1 disorder (HCC)   . OCD (obsessive compulsive disorder)   . PTSD (post-traumatic stress disorder)     Past Surgical History:  Procedure Laterality Date  . BREAST ENHANCEMENT SURGERY    . CESAREAN SECTION    . TUBAL LIGATION      Family History:  Family History  Problem Relation Age of Onset  . Healthy Mother   . Other Father        unknown medical history    Social History:  Social History   Socioeconomic History  . Marital status: Divorced    Spouse name: Not on file  . Number of children: Not on file  . Years of education: Not on file  .  Highest education level: Not on file  Occupational History  . Not on file  Tobacco Use  . Smoking status: Never Smoker  . Smokeless tobacco: Never Used  Vaping Use  . Vaping Use: Never used  Substance and Sexual Activity  . Alcohol use: Not Currently  . Drug use: Yes    Frequency: 2.0 times per week    Types: Marijuana  . Sexual activity: Not on file  Other Topics Concern  . Not on file  Social History Narrative  . Not on file   Social Determinants of Health   Financial Resource Strain:   . Difficulty of Paying Living Expenses: Not on file  Food Insecurity:   . Worried About Programme researcher, broadcasting/film/videounning Out of Food in the Last Year: Not on file  . Ran Out of Food in the Last Year: Not on file  Transportation Needs:   .  Lack of Transportation (Medical): Not on file  . Lack of Transportation (Non-Medical): Not on file  Physical Activity:   . Days of Exercise per Week: Not on file  . Minutes of Exercise per Session: Not on file  Stress:   . Feeling of Stress : Not on file  Social Connections:   . Frequency of Communication with Friends and Family: Not on file  . Frequency of Social Gatherings with Friends and Family: Not on file  . Attends Religious Services: Not on file  . Active Member of Clubs or Organizations: Not on file  . Attends BankerClub or Organization Meetings: Not on file  . Marital Status: Not on file  Intimate Partner Violence:   . Fear of Current or Ex-Partner: Not on file  . Emotionally Abused: Not on file  . Physically Abused: Not on file  . Sexually Abused: Not on file    SDOH:  SDOH Screenings   Alcohol Screen:   . Last Alcohol Screening Score (AUDIT): Not on file  Depression (PHQ2-9):   . PHQ-2 Score: Not on file  Financial Resource Strain:   . Difficulty of Paying Living Expenses: Not on file  Food Insecurity:   . Worried About Programme researcher, broadcasting/film/videounning Out of Food in the Last Year: Not on file  . Ran Out of Food in the Last Year: Not on file  Housing:   . Last Housing Risk Score: Not on file  Physical Activity:   . Days of Exercise per Week: Not on file  . Minutes of Exercise per Session: Not on file  Social Connections:   . Frequency of Communication with Friends and Family: Not on file  . Frequency of Social Gatherings with Friends and Family: Not on file  . Attends Religious Services: Not on file  . Active Member of Clubs or Organizations: Not on file  . Attends BankerClub or Organization Meetings: Not on file  . Marital Status: Not on file  Stress:   . Feeling of Stress : Not on file  Tobacco Use: Low Risk   . Smoking Tobacco Use: Never Smoker  . Smokeless Tobacco Use: Never Used  Transportation Needs:   . Freight forwarderLack of Transportation (Medical): Not on file  . Lack of Transportation  (Non-Medical): Not on file    Last Labs:  No visits with results within 6 Month(s) from this visit.  Latest known visit with results is:  Admission on 12/05/2018, Discharged on 12/05/2018  Component Date Value Ref Range Status  . WBC 12/05/2018 6.2  4.0 - 10.5 K/uL Final  . RBC 12/05/2018 4.31  3.87 - 5.11 MIL/uL  Final  . Hemoglobin 12/05/2018 12.9  12.0 - 15.0 g/dL Final  . HCT 76/28/3151 38.1  36 - 46 % Final  . MCV 12/05/2018 88.4  80.0 - 100.0 fL Final  . MCH 12/05/2018 29.9  26.0 - 34.0 pg Final  . MCHC 12/05/2018 33.9  30.0 - 36.0 g/dL Final  . RDW 76/16/0737 11.9  11.5 - 15.5 % Final  . Platelets 12/05/2018 325  150 - 400 K/uL Final  . nRBC 12/05/2018 0.0  0.0 - 0.2 % Final   Performed at [facility], [address], [place], [place] [address]  . Color, Urine 12/05/2018 YELLOW* YELLOW Final  . APPearance 12/05/2018 HAZY* CLEAR Final  . Specific Gravity, Urine 12/05/2018 1.016  1.005 - 1.030 Final  . pH 12/05/2018 5.0  5.0 - 8.0 Final  . Glucose, UA 12/05/2018 NEGATIVE  NEGATIVE mg/dL Final  . Hgb urine dipstick 12/05/2018 LARGE* NEGATIVE Final  . Bilirubin Urine 12/05/2018 NEGATIVE  NEGATIVE Final  . Ketones, ur 12/05/2018 NEGATIVE  NEGATIVE mg/dL Final  . Protein, ur 94/85/4627 NEGATIVE  NEGATIVE mg/dL Final  . Nitrite 03/50/0938 NEGATIVE  NEGATIVE Final  . Glori Luis 12/05/2018 NEGATIVE  NEGATIVE Final  . RBC / HPF 12/05/2018 >50* 0 - 5 RBC/hpf Final  . WBC, UA 12/05/2018 0-5  0 - 5 WBC/hpf Final  . Bacteria, UA 12/05/2018 NONE SEEN  NONE SEEN Final  . Squamous Epithelial / LPF 12/05/2018 0-5  0 - 5 Final  . Mucus 12/05/2018 PRESENT   Final   Performed at [facility], [address] [place], [place] [address]  . Chlamydia trachomatis, NAA 12/05/2018 Negative  Negative Final  . Neisseria Gonorrhoeae by PCR 12/05/2018 Negative  Negative Final   Comment: (NOTE) Performed At: [facility] [address] [place], [place]  [id number] Jolene Schimke MD FY:[id number]   . CT/NG NAA Source 12/05/2018 PENDING   Incomplete  . Preg Test, Ur 12/05/2018 NEGATIVE  NEGATIVE Final   Comment:        THE SENSITIVITY OF THIS METHODOLOGY IS >24 mIU/mL     Allergies: Penicillins and Latex  PTA Medications: (Not in a hospital admission)   Medical Decision Making  Reviewed treatment plan with patient including Ativan detox protocol and initiation of home medications, Vraylar and prazosin.  Patient agrees with treatment plan.  Start: Vraylar 1.5 mg daily Prazosin 1 mg nightly    Recommendations  Based on my evaluation the patient does not appear to have an emergency medical condition.   Patient reviewed with Dr. Nelly Rout.  Patient will be placed in the continuous assessment area at [facility] for treatment and stabilization.  Patient will be reevaluated on 10/30/2019, treatment team will determine disposition at that time.  Patrcia Dolly, FNP 10/29/19  3:29 PM
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
## 2019-10-30 LAB — PROLACTIN: Prolactin: 14.3 ng/mL (ref 4.8–23.3)

## 2019-10-30 MED ORDER — CHLORDIAZEPOXIDE HCL 25 MG PO CAPS: 25.0000 mg | ORAL_CAPSULE | Freq: Four times a day (QID) | ORAL | Status: DC | PRN

## 2019-10-30 MED ORDER — CARIPRAZINE HCL 1.5 MG PO CAPS: 1.5000 mg | ORAL_CAPSULE | Freq: Every day | ORAL | 0 refills | Status: DC

## 2019-10-30 MED ORDER — PRAZOSIN HCL 1 MG PO CAPS: 1.0000 mg | ORAL_CAPSULE | Freq: Every day | ORAL | 0 refills | Status: DC

## 2019-10-30 MED ORDER — GABAPENTIN 100 MG PO CAPS
100.0000 mg | ORAL_CAPSULE | Freq: Three times a day (TID) | ORAL | Status: DC
  Administered 2019-10-30: 100 mg via ORAL
  Filled 2019-10-30: qty 1

## 2019-10-30 MED ORDER — HYDROXYZINE HCL 25 MG PO TABS: 25.0000 mg | ORAL_TABLET | Freq: Three times a day (TID) | ORAL | 0 refills | Status: DC | PRN

## 2019-10-30 MED ORDER — GABAPENTIN 100 MG PO CAPS: 100.0000 mg | ORAL_CAPSULE | Freq: Three times a day (TID) | ORAL | 0 refills | Status: DC

## 2019-10-30 NOTE — ED Notes (Signed)
Pt discharged in no acute distress. Verbalized understanding of AVS reviewed by Clinical research associate. Denied SI/HI. Denied withdrawal sx from ETOH. Pt had no belongings to be returned. Pt escorted to lobby to safe transport service driver to take pt to home. Safety maintained.

## 2019-10-30 NOTE — ED Notes (Signed)
Pt A&O x4. Denies SI/HI at present. Denies withdrawal sx from ETOH. Pt reports, "that Ativan makes me sleepy and drowsy throughout the day. I don't want that medicine anymore". Informed pt that Ativan was changed to Librium to help manage withdrawal sx. Pt verbalized understanding. Informed pt to notify staff with any needs or concerns. Safety maintained.

## 2019-10-30 NOTE — ED Notes (Signed)
Called safe transport for transportation to home

## 2019-10-30 NOTE — ED Provider Notes (Signed)
[facility]/OBS ASAP Discharge Summary  Date and Time: 10/30/2019 9:48 AM  Name: Rebecca KearnsKristin Duffy  MRN:  [id number]   Discharge Diagnoses:  Final diagnoses:  Substance induced mood disorder (HCC)  Alcohol use disorder, moderate, dependence (HCC)    Subjective: Patient reports today that she is doing better.  She denies any suicidal homicidal ideations and denies any hallucinations.  Patient reports that she needs to get help for substance abuse, but states that she cannot go to a residential facility because she has kids and animals at home.  She states that she is looking for 1 to 2 day stay but is interested and going to outpatient resources such as a substance abuse IOP or to regular outpatient appointments.  Patient reports that she has not made an agreement with her parents that she will follow-up with AA and NA to help with her substance abuse problem.  Patient reports that her Ativan has been a little too sedating and would like to have something different.  She states that she does want to continue her other medications to remain stable.  She [place] [facility] [place] [facility] [place] and [place].  She reports that she has had some minor withdrawal symptoms which included sweating.  Stay Summary: Patient is a 39 year old female who presented to the [facility] reporting drinking alcohol and using cocaine and that she had not eaten for 3 days.  Patient is reporting being upset with herself because of her relapse as well as not remaining stable.  Patient has been noncompliant with her medications and endorsing suicidal ideations.  She does say that she is tired of being an alcoholic.  Patient was admitted to the continuous observation unit for overnight arms.  Patient reported that she currently lives at home with her mother, stepfather, 2 daughters and parents.  Patient was restarted on home medications of Vraylar 1.5 mg p.o. daily and prazosin 1 mg p.o. nightly.  Today patient  has denied any suicidal or homicidal ideations and denied any hallucinations.  Patient states that she feels that she is ready to go and she will follow-up with outpatient for her substance abuse treatment.  Patient's mother was contacted by nursing and stated that she could pick the patient up after 3 however patient stated that she was ready to go and requested assistance with transportation.  Patient was transported home via safe transport.  Patient is followed by [facility]  Patient was provided with prescriptions for her medications.  Total Time spent with patient: 30 minutes  Past Psychiatric History: ADHD, OCD, PTSD, bipolar 1 disorder Past Medical History:  Past Medical History:  Diagnosis Date  . ADHD   . Bipolar 1 disorder (HCC)   . OCD (obsessive compulsive disorder)   . PTSD (post-traumatic stress disorder)     Past Surgical History:  Procedure Laterality Date  . BREAST ENHANCEMENT SURGERY    . CESAREAN SECTION    . TUBAL LIGATION     Family History:  Family History  Problem Relation Age of Onset  . Healthy Mother   . Other Father        unknown medical history   Family Psychiatric History: None reported Social History:  Social History   Substance and Sexual Activity  Alcohol Use Not Currently     Social History   Substance and Sexual Activity  Drug Use Yes  . Frequency: 2.0 times per week  . Types: Marijuana  Social History   Socioeconomic History  . Marital status: Divorced    Spouse name: Not on file  . Number of children: Not on file  . Years of education: Not on file  . Highest education level: Not on file  Occupational History  . Not on file  Tobacco Use  . Smoking status: Never Smoker  . Smokeless tobacco: Never Used  Vaping Use  . Vaping Use: Never used  Substance and Sexual Activity  . Alcohol use: Not Currently  . Drug use: Yes    Frequency: 2.0 times per week    Types: Marijuana  . Sexual activity: Not on  file  Other Topics Concern  . Not on file  Social History Narrative  . Not on file   Social Determinants of Health   Financial Resource Strain:   . Difficulty of Paying Living Expenses: Not on file  Food Insecurity:   . Worried About Programme researcher, broadcasting/film/video in the Last Year: Not on file  . Ran Out of Food in the Last Year: Not on file  Transportation Needs:   . Lack of Transportation (Medical): Not on file  . Lack of Transportation (Non-Medical): Not on file  Physical Activity:   . Days of Exercise per Week: Not on file  . Minutes of Exercise per Session: Not on file  Stress:   . Feeling of Stress : Not on file  Social Connections:   . Frequency of Communication with Friends and Family: Not on file  . Frequency of Social Gatherings with Friends and Family: Not on file  . Attends Religious Services: Not on file  . Active Member of Clubs or Organizations: Not on file  . Attends Banker Meetings: Not on file  . Marital Status: Not on file   SDOH:  SDOH Screenings   Alcohol Screen:   . Last Alcohol Screening Score (AUDIT): Not on file  Depression (PHQ2-9): Medium Risk  . PHQ-2 Score: 14  Financial Resource Strain:   . Difficulty of Paying Living Expenses: Not on file  Food Insecurity:   . Worried About Programme researcher, broadcasting/film/video in the Last Year: Not on file  . Ran Out of Food in the Last Year: Not on file  Housing:   . Last Housing Risk Score: Not on file  Physical Activity:   . Days of Exercise per Week: Not on file  . Minutes of Exercise per Session: Not on file  Social Connections:   . Frequency of Communication with Friends and Family: Not on file  . Frequency of Social Gatherings with Friends and Family: Not on file  . Attends Religious Services: Not on file  . Active Member of Clubs or Organizations: Not on file  . Attends Banker Meetings: Not on file  . Marital Status: Not on file  Stress:   . Feeling of Stress : Not on file  Tobacco Use: Low  Risk   . Smoking Tobacco Use: Never Smoker  . Smokeless Tobacco Use: Never Used  Transportation Needs:   . Freight forwarder (Medical): Not on file  . Lack of Transportation (Non-Medical): Not on file    Has this patient used any form of tobacco in the last 30 days? (Cigarettes, Smokeless Tobacco, Cigars, and/or Pipes) A prescription for an FDA-approved tobacco cessation medication was offered at discharge and the patient refused  Current Medications:  Current Facility-Administered Medications  Medication Dose Route Frequency Provider Last Rate Last Admin  . acetaminophen (TYLENOL)  tablet 650 mg  650 mg Oral Q6H PRN Patrcia Dolly, FNP      . alum & mag hydroxide-simeth (MAALOX/MYLANTA) 200-200-20 MG/5ML suspension 30 mL  30 mL Oral Q4H PRN Patrcia Dolly, FNP      . cariprazine (VRAYLAR) capsule 1.5 mg  1.5 mg Oral Daily Patrcia Dolly, FNP   1.5 mg at 10/29/19 1747  . chlordiazePOXIDE (LIBRIUM) capsule 25 mg  25 mg Oral Q6H PRN Julita Ozbun, Feliz Beam B, FNP      . gabapentin (NEURONTIN) capsule 100 mg  100 mg Oral TID Lolitha Tortora, Gerlene Burdock, FNP      . hydrOXYzine (ATARAX/VISTARIL) tablet 25 mg  25 mg Oral TID PRN Patrcia Dolly, FNP      . loperamide (IMODIUM) capsule 2-4 mg  2-4 mg Oral PRN Patrcia Dolly, FNP      . magnesium hydroxide (MILK OF MAGNESIA) suspension 30 mL  30 mL Oral Daily PRN Patrcia Dolly, FNP      . multivitamin with minerals tablet 1 tablet  1 tablet Oral Daily Patrcia Dolly, FNP   1 tablet at 10/29/19 1746  . ondansetron (ZOFRAN-ODT) disintegrating tablet 4 mg  4 mg Oral Q6H PRN Patrcia Dolly, FNP      . prazosin (MINIPRESS) capsule 1 mg  1 mg Oral QHS Patrcia Dolly, FNP   1 mg at 10/29/19 2109  . thiamine tablet 100 mg  100 mg Oral Daily Patrcia Dolly, FNP   100 mg at 10/29/19 1747  . traZODone (DESYREL) tablet 50 mg  50 mg Oral QHS PRN Patrcia Dolly, FNP   50 mg at 10/29/19 2109   Current Outpatient Medications  Medication Sig Dispense Refill  . albuterol (PROVENTIL HFA;VENTOLIN HFA)  108 (90 Base) MCG/ACT inhaler Inhale 2 puffs into the lungs every 4 (four) hours as needed for wheezing or shortness of breath. 1 Inhaler 0  . azithromycin (ZITHROMAX) 250 MG tablet Take 1 tablet (250 mg total) by mouth daily. Take first 2 tablets together, then 1 every day until finished. 6 tablet 0  . BIOTIN PO Take 1 tablet by mouth daily.    . fluticasone (FLONASE) 50 MCG/ACT nasal spray Place 1 spray into both nostrils 2 (two) times daily. 16 g 0  . IRON, FERROUS SULFATE, PO Take 1 tablet by mouth daily.    Marland Kitchen MAGNESIUM PO Take 1 tablet by mouth daily.    . Multiple Vitamins-Minerals (ZINC PO) Take 1 tablet by mouth daily.    . Omega-3 Fatty Acids (FISH OIL PO) Take 1 tablet by mouth daily.    Marland Kitchen UNABLE TO FIND Bladderwrack      PTA Medications: (Not in a hospital admission)   Musculoskeletal  Strength & Muscle Tone: within normal limits Gait & Station: normal Patient leans: N/A  Psychiatric Specialty Exam  Presentation  General Appearance: Appropriate for Environment;Casual  Eye Contact:Good  Speech:Clear and Coherent;Normal Rate  Speech Volume:Decreased  Handedness:Right   Mood and Affect  Mood:Anxious  Affect:Appropriate;Congruent   Thought Process  Thought Processes:Coherent  Descriptions of Associations:Intact  Orientation:Full (Time, Place and Person)  Thought Content:WDL  Hallucinations:Hallucinations: None  Ideas of Reference:None  Suicidal Thoughts:Suicidal Thoughts: No  Homicidal Thoughts:Homicidal Thoughts: No   Sensorium  Memory:Immediate Good;Recent Good;Remote Good  Judgment:Fair  Insight:Good   Executive Functions  Concentration:Good  Attention Span:Good  Recall:Good  Fund of Knowledge:Good  Language:Good   Psychomotor Activity  Psychomotor Activity:Psychomotor Activity: Normal   Assets  Assets:Communication Skills;Desire  for Improvement;Financial Resources/Insurance;Housing;Physical Health;Social  Support;Transportation   Sleep  Sleep:Sleep: Good   Physical Exam  Physical Exam Vitals and nursing note reviewed.  Constitutional:      Appearance: She is well-developed.  HENT:     Head: Normocephalic.  Eyes:     Pupils: Pupils are equal, round, and reactive to light.  Cardiovascular:     Rate and Rhythm: Normal rate.  Pulmonary:     Effort: Pulmonary effort is normal.  Musculoskeletal:        General: Normal range of motion.  Neurological:     Mental Status: She is alert and oriented to person, place, and time.    Review of Systems  Constitutional: Negative.   HENT: Negative.   Eyes: Negative.   Respiratory: Negative.   Cardiovascular: Negative.   Gastrointestinal: Negative.   Genitourinary: Negative.   Musculoskeletal: Negative.   Skin: Negative.   Neurological: Negative.   Endo/Heme/Allergies: Negative.   Psychiatric/Behavioral: Positive for substance abuse.   Blood pressure 130/81, pulse 94, temperature 97.9 F (36.6 C), temperature source Oral, resp. rate 18, height 5\' 9"  (1.753 m), weight 198 lb (89.8 kg), SpO2 98 %. Body mass index is 29.24 kg/m.  Demographic Factors:  Divorced or widowed and Caucasian  Loss Factors: NA  Historical Factors: NA  Risk Reduction Factors:   Responsible for children under 46 years of age, Sense of responsibility to family, Employed, Living with another person, especially a relative, Positive social support and Positive therapeutic relationship  Continued Clinical Symptoms:  Alcohol/Substance Abuse/Dependencies Previous Psychiatric Diagnoses and Treatments  Cognitive Features That Contribute To Risk:  None    Suicide Risk:  Minimal: No identifiable suicidal ideation.  Patients presenting with no risk factors but with morbid ruminations; may be classified as minimal risk based on the severity of the depressive symptoms  Plan Of Care/Follow-up recommendations:  Continue activity as tolerated. Continue diet as  recommended by your PCP. Ensure to keep all appointments with outpatient providers.  Disposition: Discharge home to family  15, FNP 10/30/2019, 9:48 AM

## 2019-10-30 NOTE — ED Notes (Signed)
Pt sleeping in no acute distress. Safety maintained. 

## 2019-10-30 NOTE — ED Notes (Signed)
Pt sleeping at present, no distress noted, monitoring for safety. 

## 2019-10-30 NOTE — Discharge Instructions (Addendum)

## 2020-02-07 ENCOUNTER — Other Ambulatory Visit: Payer: Self-pay

## 2020-02-07 ENCOUNTER — Ambulatory Visit
Admission: EM | Admit: 2020-02-07 | Discharge: 2020-02-07 | Disposition: A | Discharge status: Home / Self Care | Payer: Medicaid Other | Attending: Family Medicine | Admitting: Family Medicine

## 2020-02-07 DIAGNOSIS — R509 Fever, unspecified: Secondary | ICD-10-CM | POA: Diagnosis not present

## 2020-02-07 DIAGNOSIS — R519 Headache, unspecified: Secondary | ICD-10-CM

## 2020-02-07 DIAGNOSIS — B349 Viral infection, unspecified: Secondary | ICD-10-CM

## 2020-02-07 DIAGNOSIS — R52 Pain, unspecified: Secondary | ICD-10-CM | POA: Diagnosis not present

## 2020-02-07 DIAGNOSIS — Z1152 Encounter for screening for COVID-19: Secondary | ICD-10-CM | POA: Diagnosis not present

## 2020-02-07 MED ORDER — KETOROLAC TROMETHAMINE 30 MG/ML IJ SOLN
30.0000 mg | Freq: Once | INTRAMUSCULAR | Status: AC
  Administered 2020-02-07: 30 mg via INTRAMUSCULAR

## 2020-02-07 NOTE — ED Triage Notes (Signed)
Pt reports fever, HA, body aches x 1 day.  Took Advil 0300 with minimal relief.

## 2020-02-07 NOTE — ED Provider Notes (Signed)
Rebecca Duffy    CSN: [id number] Arrival date & time: 02/07/20  9629      History   Chief Complaint Chief Complaint  Patient presents with  . Fever    HPI Rebecca Duffy is a 40 y.o. female.   Patient is a 40 year old female who presents today with fever, headache, body aches x1 day.  Took Advil at 3:00 this morning with minimal relief.  Mild nasal congestion and ear pressure.  No nausea, vomiting or diarrhea.  No known sick contacts.     Past Medical History:  Diagnosis Date  . ADHD   . Bipolar 1 disorder (HCC)   . OCD (obsessive compulsive disorder)   . PTSD (post-traumatic stress disorder)     Patient Active Problem List   Diagnosis Date Noted  . Bipolar I disorder, most recent episode depressed (HCC)   . Alcohol use disorder, severe, dependence (HCC)   . Cannabis use disorder, severe, dependence (HCC)     Past Surgical History:  Procedure Laterality Date  . BREAST ENHANCEMENT SURGERY    . CESAREAN SECTION    . TUBAL LIGATION      OB History   No obstetric history on file.      Home Medications    Prior to Admission medications   Medication Sig Start Date End Date Taking? Authorizing Provider  albuterol (PROVENTIL HFA;VENTOLIN HFA) 108 (90 Base) MCG/ACT inhaler Inhale 2 puffs into the lungs every 4 (four) hours as needed for wheezing or shortness of breath. Patient not taking: Reported on 10/30/2019 10/25/17   Cuthriell, Delorise Royals, PA-C  cariprazine (VRAYLAR) capsule Take 1 capsule (1.5 mg total) by mouth daily. 10/30/19   Money, Gerlene Burdock, FNP  fluticasone (FLONASE) 50 MCG/ACT nasal spray Place 1 spray into both nostrils 2 (two) times daily. Patient not taking: Reported on 10/30/2019 10/25/17   Cuthriell, Delorise Royals, PA-C  gabapentin (NEURONTIN) 100 MG capsule Take 1 capsule (100 mg total) by mouth 3 (three) times daily. 10/30/19   Money, Gerlene Burdock, FNP  hydrOXYzine (ATARAX/VISTARIL) 25 MG tablet Take 1 tablet (25 mg total) by mouth 3 (three)  times daily as needed for anxiety. 10/30/19   Money, Gerlene Burdock, FNP  prazosin (MINIPRESS) 1 MG capsule Take 1 capsule (1 mg total) by mouth at bedtime. 10/30/19   Money, Gerlene Burdock, FNP    Family History Family History  Problem Relation Age of Onset  . Healthy Mother   . Other Father        unknown medical history    Social History Social History   Tobacco Use  . Smoking status: Never Smoker  . Smokeless tobacco: Never Used  Vaping Use  . Vaping Use: Never used  Substance Use Topics  . Alcohol use: Not Currently  . Drug use: Yes    Frequency: 2.0 times per week    Types: Marijuana     Allergies   Penicillins and Latex   Review of Systems Review of Systems   Physical Exam Triage Vital Signs ED Triage Vitals  Enc Vitals Group     BP 02/07/20 0854 (!) 142/93     Pulse Rate 02/07/20 0854 (!) 102     Resp 02/07/20 0854 19     Temp 02/07/20 0854 99.8 F (37.7 C)     Temp Source 02/07/20 0854 Oral     SpO2 02/07/20 0854 98 %     Weight --      Height --      Head  Circumference --      Peak Flow --      Pain Score 02/07/20 0851 5     Pain Loc --      Pain Edu? --      Excl. in GC? --    No data found.  Updated Vital Signs BP (!) 142/93 (BP Location: Left Arm)   Pulse (!) 102   Temp 99.8 F (37.7 C) (Oral)   Resp 19   LMP 01/18/2020   SpO2 98%   Visual Acuity Right Eye Distance:   Left Eye Distance:   Bilateral Distance:    Right Eye Near:   Left Eye Near:    Bilateral Near:     Physical Exam Vitals and nursing note reviewed.  Constitutional:      General: She is not in acute distress.    Appearance: Normal appearance. She is not ill-appearing, toxic-appearing or diaphoretic.  HENT:     Head: Normocephalic.     Right Ear: Tympanic membrane and ear canal normal.     Left Ear: Tympanic membrane and ear canal normal.     Nose: Congestion present.     Mouth/Throat:     Pharynx: Oropharynx is clear.  Eyes:     Conjunctiva/sclera: Conjunctivae  normal.  Cardiovascular:     Rate and Rhythm: Normal rate and regular rhythm.  Pulmonary:     Effort: Pulmonary effort is normal.     Breath sounds: Normal breath sounds.  Musculoskeletal:        General: Normal range of motion.     Cervical back: Normal range of motion.  Skin:    General: Skin is warm and dry.     Findings: No rash.  Neurological:     Mental Status: She is alert.  Psychiatric:        Mood and Affect: Mood normal.      UC Treatments / Results  Labs (all labs ordered are listed, but only abnormal results are displayed) Labs Reviewed  COVID-19, FLU A+B NAA    EKG   Radiology No results found.  Procedures Procedures (including critical care time)  Medications Ordered in UC Medications  ketorolac (TORADOL) 30 MG/ML injection 30 mg (has no administration in time range)    Initial Impression / Assessment and Plan / UC Course  I have reviewed the triage vital signs and the nursing notes.  Pertinent labs & imaging results that were available during my care of the patient were reviewed by me and considered in my medical decision making (see chart for details).     Viral illness Nothing concerning on exam.  COVID and flu swab pending.  Recommended over-the-counter medicines for symptoms as needed.  Toradol given here for increased pain and body aches. Follow up as needed for continued or worsening symptoms  Final Clinical Impressions(s) / UC Diagnoses   Final diagnoses:  Encounter for screening for COVID-19  Viral illness     Discharge Instructions     Testing you for flu and covid  You can take tylenol and ibuprofen for your symptoms  Cold medicines may help Rest, hydrate Follow up as needed for continued or worsening symptoms     ED Prescriptions    None     PDMP not reviewed this encounter.   Janace Aris, NP 02/07/20 [phone]

## 2020-02-07 NOTE — Discharge Instructions (Signed)
Testing you for flu and covid  You can take tylenol and ibuprofen for your symptoms  Cold medicines may help Rest, hydrate Follow up as needed for continued or worsening symptoms

## 2020-02-09 LAB — COVID-19, FLU A+B NAA
Influenza A, NAA: NOT DETECTED
Influenza B, NAA: NOT DETECTED
SARS-CoV-2, NAA: DETECTED — AB

## 2020-06-26 ENCOUNTER — Emergency Department: Payer: Medicaid Other

## 2020-06-26 ENCOUNTER — Encounter: Payer: Self-pay | Admitting: Emergency Medicine

## 2020-06-26 ENCOUNTER — Other Ambulatory Visit: Payer: Self-pay

## 2020-06-26 ENCOUNTER — Emergency Department
Admission: EM | Admit: 2020-06-26 | Discharge: 2020-06-26 | Disposition: A | Discharge status: Home / Self Care | Payer: Medicaid Other | Attending: Emergency Medicine | Admitting: Emergency Medicine

## 2020-06-26 DIAGNOSIS — Y9234 Swimming pool (public) as the place of occurrence of the external cause: Secondary | ICD-10-CM | POA: Insufficient documentation

## 2020-06-26 DIAGNOSIS — M25571 Pain in right ankle and joints of right foot: Secondary | ICD-10-CM | POA: Diagnosis present

## 2020-06-26 DIAGNOSIS — W1840XA Slipping, tripping and stumbling without falling, unspecified, initial encounter: Secondary | ICD-10-CM | POA: Insufficient documentation

## 2020-06-26 DIAGNOSIS — Z5321 Procedure and treatment not carried out due to patient leaving prior to being seen by health care provider: Secondary | ICD-10-CM | POA: Insufficient documentation

## 2020-06-26 MED ORDER — IBUPROFEN 400 MG PO TABS
400.0000 mg | ORAL_TABLET | Freq: Once | ORAL | Status: AC | PRN
  Administered 2020-06-26: 18:00:00 400 mg via ORAL
  Filled 2020-06-26: qty 1

## 2020-06-26 NOTE — ED Triage Notes (Signed)
Pt comes into the ED via POV c/o right ankle pain after slipping at the pool.  Pt ambulatory to triage at this time with minimal swelling and no deformity noted.  Pt tearful in triage.

## 2021-04-01 ENCOUNTER — Other Ambulatory Visit: Payer: Self-pay | Admitting: Nurse Practitioner

## 2021-04-01 DIAGNOSIS — R1011 Right upper quadrant pain: Secondary | ICD-10-CM

## 2021-04-01 DIAGNOSIS — G8929 Other chronic pain: Secondary | ICD-10-CM

## 2021-04-01 DIAGNOSIS — R110 Nausea: Secondary | ICD-10-CM

## 2021-04-02 ENCOUNTER — Encounter
Admission: RE | Admit: 2021-04-02 | Discharge: 2021-04-02 | Disposition: A | Discharge status: Home / Self Care | Payer: Medicaid Other | Source: Ambulatory Visit | Attending: Nurse Practitioner | Admitting: Nurse Practitioner

## 2021-04-02 ENCOUNTER — Ambulatory Visit
Admission: RE | Admit: 2021-04-02 | Discharge: 2021-04-02 | Disposition: A | Discharge status: Home / Self Care | Payer: Medicaid Other | Source: Ambulatory Visit | Attending: Nurse Practitioner | Admitting: Nurse Practitioner

## 2021-04-02 ENCOUNTER — Other Ambulatory Visit: Payer: Self-pay

## 2021-04-02 DIAGNOSIS — R1011 Right upper quadrant pain: Secondary | ICD-10-CM

## 2021-04-02 DIAGNOSIS — G8929 Other chronic pain: Secondary | ICD-10-CM

## 2021-04-02 DIAGNOSIS — R110 Nausea: Secondary | ICD-10-CM | POA: Insufficient documentation

## 2021-04-02 MED ORDER — TECHNETIUM TC 99M MEBROFENIN IV KIT
5.2700 | PACK | Freq: Once | INTRAVENOUS | Status: AC | PRN
  Administered 2021-04-02: 5.27 via INTRAVENOUS

## 2021-06-02 ENCOUNTER — Encounter: Payer: Self-pay | Admitting: *Deleted

## 2021-06-03 ENCOUNTER — Encounter: Admission: RE | Payer: Self-pay | Source: Home / Self Care

## 2021-06-03 ENCOUNTER — Ambulatory Visit: Admission: RE | Admit: 2021-06-03 | Payer: Medicaid Other | Source: Home / Self Care

## 2021-06-03 SURGERY — ESOPHAGOGASTRODUODENOSCOPY (EGD) WITH PROPOFOL
Anesthesia: General

## 2021-11-14 LAB — GC/CHLAMYDIA PROBE AMP
Chlamydia trachomatis, NAA: NEGATIVE
Neisseria Gonorrhoeae by PCR: NEGATIVE

## 2021-11-24 ENCOUNTER — Emergency Department: Payer: No Typology Code available for payment source

## 2021-11-24 ENCOUNTER — Encounter: Payer: Self-pay | Admitting: Intensive Care

## 2021-11-24 ENCOUNTER — Other Ambulatory Visit: Payer: Self-pay

## 2021-11-24 ENCOUNTER — Encounter
Admission: EM | Disposition: A | Discharge status: Home / Self Care | Payer: Self-pay | Source: Home / Self Care | Attending: Emergency Medicine

## 2021-11-24 ENCOUNTER — Inpatient Hospital Stay (HOSPITAL_COMMUNITY)
Admit: 2021-11-24 | Discharge: 2021-11-24 | Disposition: A | Discharge status: Home / Self Care | Payer: No Typology Code available for payment source | Attending: Internal Medicine | Admitting: Internal Medicine

## 2021-11-24 ENCOUNTER — Observation Stay
Admission: EM | Admit: 2021-11-24 | Discharge: 2021-11-25 | Disposition: A | Discharge status: Home / Self Care | Payer: No Typology Code available for payment source | Attending: Internal Medicine | Admitting: Internal Medicine

## 2021-11-24 DIAGNOSIS — R079 Chest pain, unspecified: Secondary | ICD-10-CM | POA: Diagnosis not present

## 2021-11-24 DIAGNOSIS — R778 Other specified abnormalities of plasma proteins: Secondary | ICD-10-CM | POA: Diagnosis not present

## 2021-11-24 DIAGNOSIS — I214 Non-ST elevation (NSTEMI) myocardial infarction: Secondary | ICD-10-CM | POA: Diagnosis present

## 2021-11-24 DIAGNOSIS — K529 Noninfective gastroenteritis and colitis, unspecified: Secondary | ICD-10-CM | POA: Diagnosis not present

## 2021-11-24 DIAGNOSIS — I319 Disease of pericardium, unspecified: Principal | ICD-10-CM | POA: Diagnosis present

## 2021-11-24 DIAGNOSIS — F319 Bipolar disorder, unspecified: Secondary | ICD-10-CM | POA: Diagnosis not present

## 2021-11-24 DIAGNOSIS — F4310 Post-traumatic stress disorder, unspecified: Secondary | ICD-10-CM | POA: Diagnosis present

## 2021-11-24 DIAGNOSIS — F1290 Cannabis use, unspecified, uncomplicated: Secondary | ICD-10-CM | POA: Diagnosis not present

## 2021-11-24 DIAGNOSIS — Z79899 Other long term (current) drug therapy: Secondary | ICD-10-CM

## 2021-11-24 DIAGNOSIS — Z789 Other specified health status: Secondary | ICD-10-CM | POA: Diagnosis not present

## 2021-11-24 DIAGNOSIS — Z91040 Latex allergy status: Secondary | ICD-10-CM

## 2021-11-24 DIAGNOSIS — F17210 Nicotine dependence, cigarettes, uncomplicated: Secondary | ICD-10-CM | POA: Diagnosis not present

## 2021-11-24 DIAGNOSIS — Z716 Tobacco abuse counseling: Secondary | ICD-10-CM

## 2021-11-24 DIAGNOSIS — F909 Attention-deficit hyperactivity disorder, unspecified type: Secondary | ICD-10-CM | POA: Diagnosis present

## 2021-11-24 DIAGNOSIS — Z720 Tobacco use: Secondary | ICD-10-CM

## 2021-11-24 DIAGNOSIS — F429 Obsessive-compulsive disorder, unspecified: Secondary | ICD-10-CM | POA: Insufficient documentation

## 2021-11-24 DIAGNOSIS — D72829 Elevated white blood cell count, unspecified: Secondary | ICD-10-CM

## 2021-11-24 DIAGNOSIS — F3130 Bipolar disorder, current episode depressed, mild or moderate severity, unspecified: Secondary | ICD-10-CM

## 2021-11-24 DIAGNOSIS — Z8711 Personal history of peptic ulcer disease: Secondary | ICD-10-CM | POA: Insufficient documentation

## 2021-11-24 DIAGNOSIS — R7989 Other specified abnormal findings of blood chemistry: Secondary | ICD-10-CM | POA: Diagnosis present

## 2021-11-24 DIAGNOSIS — F1090 Alcohol use, unspecified, uncomplicated: Secondary | ICD-10-CM | POA: Diagnosis not present

## 2021-11-24 DIAGNOSIS — Z7151 Drug abuse counseling and surveillance of drug abuser: Secondary | ICD-10-CM

## 2021-11-24 DIAGNOSIS — Z7141 Alcohol abuse counseling and surveillance of alcoholic: Secondary | ICD-10-CM

## 2021-11-24 DIAGNOSIS — K259 Gastric ulcer, unspecified as acute or chronic, without hemorrhage or perforation: Secondary | ICD-10-CM | POA: Diagnosis present

## 2021-11-24 DIAGNOSIS — Z880 Allergy status to penicillin: Secondary | ICD-10-CM

## 2021-11-24 HISTORY — DX: Gastric ulcer, unspecified as acute or chronic, without hemorrhage or perforation: K25.9

## 2021-11-24 HISTORY — PX: LEFT HEART CATH AND CORONARY ANGIOGRAPHY: CATH118249

## 2021-11-24 LAB — ECHOCARDIOGRAM COMPLETE
AR max vel: 2.11 cm2
AV Area VTI: 2.08 cm2
AV Area mean vel: 2.29 cm2
AV Mean grad: 4 mmHg
AV Peak grad: 8.5 mmHg
Ao pk vel: 1.46 m/s
Area-P 1/2: 4.63 cm2
Height: 70 in
S' Lateral: 3.1 cm
Weight: 3040 oz

## 2021-11-24 LAB — CBC
HCT: 35.1 % — ABNORMAL LOW (ref 36.0–46.0)
Hemoglobin: 11.9 g/dL — ABNORMAL LOW (ref 12.0–15.0)
MCH: 30.2 pg (ref 26.0–34.0)
MCHC: 33.9 g/dL (ref 30.0–36.0)
MCV: 89.1 fL (ref 80.0–100.0)
Platelets: 313 10*3/uL (ref 150–400)
RBC: 3.94 MIL/uL (ref 3.87–5.11)
RDW: 12.8 % (ref 11.5–15.5)
WBC: 11.2 10*3/uL — ABNORMAL HIGH (ref 4.0–10.5)
nRBC: 0 % (ref 0.0–0.2)

## 2021-11-24 LAB — BASIC METABOLIC PANEL
Anion gap: 6 (ref 5–15)
BUN: 10 mg/dL (ref 6–20)
CO2: 22 mmol/L (ref 22–32)
Calcium: 8.5 mg/dL — ABNORMAL LOW (ref 8.9–10.3)
Chloride: 111 mmol/L (ref 98–111)
Creatinine, Ser: 0.55 mg/dL (ref 0.44–1.00)
GFR, Estimated: >60 mL/min (ref >60)
Glucose, Bld: 101 mg/dL — ABNORMAL HIGH (ref 70–99)
Potassium: 3.6 mmol/L (ref 3.5–5.1)
Sodium: 139 mmol/L (ref 135–145)

## 2021-11-24 LAB — BRAIN NATRIURETIC PEPTIDE: B Natriuretic Peptide: 239.9 pg/mL — ABNORMAL HIGH (ref 0.0–100.0)

## 2021-11-24 LAB — TROPONIN I (HIGH SENSITIVITY)
Troponin I (High Sensitivity): 233 ng/L (ref <18)
Troponin I (High Sensitivity): 117 ng/L (ref <18)
Troponin I (High Sensitivity): 110 ng/L (ref <18)

## 2021-11-24 LAB — HEMOGLOBIN A1C
Hgb A1c MFr Bld: 4.8 % (ref 4.8–5.6)
Mean Plasma Glucose: 91.06 mg/dL

## 2021-11-24 SURGERY — LEFT HEART CATH AND CORONARY ANGIOGRAPHY
Anesthesia: Moderate Sedation

## 2021-11-24 MED ORDER — SODIUM CHLORIDE 0.9 % WEIGHT BASED INFUSION: 1.0000 mL/kg/h | INTRAVENOUS | Status: DC

## 2021-11-24 MED ORDER — ATORVASTATIN CALCIUM 20 MG PO TABS: 40.0000 mg | ORAL_TABLET | Freq: Every day | ORAL | Status: DC

## 2021-11-24 MED ORDER — IOHEXOL 350 MG/ML SOLN
100.0000 mL | Freq: Once | INTRAVENOUS | Status: AC | PRN
  Administered 2021-11-24: 100 mL via INTRAVENOUS

## 2021-11-24 MED ORDER — IPRATROPIUM-ALBUTEROL 0.5-2.5 (3) MG/3ML IN SOLN
3.0000 mL | Freq: Once | RESPIRATORY_TRACT | Status: AC
  Administered 2021-11-24: 3 mL via RESPIRATORY_TRACT
  Filled 2021-11-24: qty 3

## 2021-11-24 MED ORDER — NITROGLYCERIN 0.4 MG SL SUBL: 0.4000 mg | SUBLINGUAL_TABLET | SUBLINGUAL | Status: DC | PRN

## 2021-11-24 MED ORDER — VERAPAMIL HCL 2.5 MG/ML IV SOLN
INTRAVENOUS | Status: AC
  Filled 2021-11-24: qty 2

## 2021-11-24 MED ORDER — HEPARIN SODIUM (PORCINE) 1000 UNIT/ML IJ SOLN
INTRAMUSCULAR | Status: DC | PRN
  Administered 2021-11-24: 4000 [IU] via INTRAVENOUS

## 2021-11-24 MED ORDER — ASPIRIN 81 MG PO TBEC
81.0000 mg | DELAYED_RELEASE_TABLET | Freq: Every day | ORAL | Status: DC
  Administered 2021-11-25: 81 mg via ORAL
  Filled 2021-11-24: qty 1

## 2021-11-24 MED ORDER — THIAMINE MONONITRATE 100 MG PO TABS
100.0000 mg | ORAL_TABLET | Freq: Every day | ORAL | Status: DC
  Administered 2021-11-25: 100 mg via ORAL
  Filled 2021-11-24: qty 1

## 2021-11-24 MED ORDER — SODIUM CHLORIDE 0.9 % IV SOLN: INTRAVENOUS | Status: AC

## 2021-11-24 MED ORDER — ACETAMINOPHEN 325 MG PO TABS: 650.0000 mg | ORAL_TABLET | Freq: Four times a day (QID) | ORAL | Status: DC | PRN

## 2021-11-24 MED ORDER — HEPARIN SODIUM (PORCINE) 1000 UNIT/ML IJ SOLN
INTRAMUSCULAR | Status: AC
  Filled 2021-11-24: qty 10

## 2021-11-24 MED ORDER — SODIUM CHLORIDE 0.9 % IV SOLN: INTRAVENOUS | Status: DC

## 2021-11-24 MED ORDER — FENTANYL CITRATE (PF) 100 MCG/2ML IJ SOLN
INTRAMUSCULAR | Status: AC
  Filled 2021-11-24: qty 2

## 2021-11-24 MED ORDER — PANTOPRAZOLE SODIUM 40 MG PO TBEC
40.0000 mg | DELAYED_RELEASE_TABLET | Freq: Two times a day (BID) | ORAL | Status: DC
  Administered 2021-11-24 – 2021-11-25 (×2): 40 mg via ORAL
  Filled 2021-11-24 (×2): qty 1

## 2021-11-24 MED ORDER — LIDOCAINE HCL 1 % IJ SOLN
INTRAMUSCULAR | Status: AC
  Filled 2021-11-24: qty 20

## 2021-11-24 MED ORDER — LORAZEPAM 2 MG/ML IJ SOLN: 1.0000 mg | INTRAMUSCULAR | Status: DC | PRN

## 2021-11-24 MED ORDER — LORAZEPAM 1 MG PO TABS: 1.0000 mg | ORAL_TABLET | ORAL | Status: DC | PRN

## 2021-11-24 MED ORDER — LORAZEPAM 2 MG/ML IJ SOLN
0.0000 mg | Freq: Four times a day (QID) | INTRAMUSCULAR | Status: DC
  Administered 2021-11-24: 2 mg via INTRAVENOUS
  Filled 2021-11-24: qty 1

## 2021-11-24 MED ORDER — MIDAZOLAM HCL 2 MG/2ML IJ SOLN
INTRAMUSCULAR | Status: DC | PRN
  Administered 2021-11-24 (×2): 1 mg via INTRAVENOUS

## 2021-11-24 MED ORDER — PERFLUTREN LIPID MICROSPHERE
1.0000 mL | INTRAVENOUS | Status: AC | PRN
  Administered 2021-11-24: 3 mL via INTRAVENOUS

## 2021-11-24 MED ORDER — MIDAZOLAM HCL 2 MG/2ML IJ SOLN
INTRAMUSCULAR | Status: AC
  Filled 2021-11-24: qty 2

## 2021-11-24 MED ORDER — ADULT MULTIVITAMIN W/MINERALS CH
1.0000 | ORAL_TABLET | Freq: Every day | ORAL | Status: DC
  Administered 2021-11-25: 1 via ORAL
  Filled 2021-11-24: qty 1

## 2021-11-24 MED ORDER — ONDANSETRON HCL 4 MG/2ML IJ SOLN: 4.0000 mg | Freq: Three times a day (TID) | INTRAMUSCULAR | Status: DC | PRN

## 2021-11-24 MED ORDER — SODIUM CHLORIDE 0.9 % WEIGHT BASED INFUSION
3.0000 mL/kg/h | INTRAVENOUS | Status: DC
  Administered 2021-11-24: 3 mL/kg/h via INTRAVENOUS

## 2021-11-24 MED ORDER — FOLIC ACID 1 MG PO TABS
1.0000 mg | ORAL_TABLET | Freq: Every day | ORAL | Status: DC
  Administered 2021-11-25: 1 mg via ORAL
  Filled 2021-11-24: qty 1

## 2021-11-24 MED ORDER — NICOTINE 21 MG/24HR TD PT24
21.0000 mg | MEDICATED_PATCH | Freq: Every day | TRANSDERMAL | Status: DC
  Administered 2021-11-24 – 2021-11-25 (×2): 21 mg via TRANSDERMAL
  Filled 2021-11-24 (×2): qty 1

## 2021-11-24 MED ORDER — HEPARIN (PORCINE) IN NACL 1000-0.9 UT/500ML-% IV SOLN
INTRAVENOUS | Status: AC
  Filled 2021-11-24: qty 1000

## 2021-11-24 MED ORDER — SODIUM CHLORIDE 0.9% FLUSH: 3.0000 mL | INTRAVENOUS | Status: DC | PRN

## 2021-11-24 MED ORDER — MORPHINE SULFATE (PF) 2 MG/ML IV SOLN
2.0000 mg | INTRAVENOUS | Status: DC | PRN
  Administered 2021-11-24 – 2021-11-25 (×4): 2 mg via INTRAVENOUS
  Filled 2021-11-24 (×4): qty 1

## 2021-11-24 MED ORDER — COLCHICINE 0.6 MG PO TABS
0.6000 mg | ORAL_TABLET | Freq: Two times a day (BID) | ORAL | Status: DC
  Administered 2021-11-24 – 2021-11-25 (×2): 0.6 mg via ORAL
  Filled 2021-11-24 (×6): qty 1

## 2021-11-24 MED ORDER — VERAPAMIL HCL 2.5 MG/ML IV SOLN
INTRAVENOUS | Status: DC | PRN
  Administered 2021-11-24: 2.5 mg via INTRA_ARTERIAL

## 2021-11-24 MED ORDER — SODIUM CHLORIDE 0.9% FLUSH: 3.0000 mL | Freq: Two times a day (BID) | INTRAVENOUS | Status: DC

## 2021-11-24 MED ORDER — HEPARIN (PORCINE) 25000 UT/250ML-% IV SOLN
1100.0000 [IU]/h | INTRAVENOUS | Status: DC
  Administered 2021-11-24: 1100 [IU]/h via INTRAVENOUS
  Filled 2021-11-24: qty 250

## 2021-11-24 MED ORDER — LORAZEPAM 2 MG/ML IJ SOLN: 0.0000 mg | Freq: Two times a day (BID) | INTRAMUSCULAR | Status: DC

## 2021-11-24 MED ORDER — HEPARIN BOLUS VIA INFUSION
4000.0000 [IU] | Freq: Once | INTRAVENOUS | Status: AC
  Administered 2021-11-24: 4000 [IU] via INTRAVENOUS
  Filled 2021-11-24: qty 4000

## 2021-11-24 MED ORDER — DM-GUAIFENESIN ER 30-600 MG PO TB12: 1.0000 | ORAL_TABLET | Freq: Two times a day (BID) | ORAL | Status: DC | PRN

## 2021-11-24 MED ORDER — SODIUM CHLORIDE 0.9 % IV SOLN: 250.0000 mL | INTRAVENOUS | Status: DC | PRN

## 2021-11-24 MED ORDER — HEPARIN (PORCINE) IN NACL 1000-0.9 UT/500ML-% IV SOLN
INTRAVENOUS | Status: DC | PRN
  Administered 2021-11-24 (×2): 500 mL

## 2021-11-24 MED ORDER — SODIUM CHLORIDE 0.9% FLUSH
3.0000 mL | Freq: Two times a day (BID) | INTRAVENOUS | Status: DC
  Administered 2021-11-24 – 2021-11-25 (×2): 3 mL via INTRAVENOUS

## 2021-11-24 MED ORDER — IOHEXOL 300 MG/ML  SOLN
INTRAMUSCULAR | Status: DC | PRN
  Administered 2021-11-24: 24 mL

## 2021-11-24 MED ORDER — THIAMINE HCL 100 MG/ML IJ SOLN: 100.0000 mg | Freq: Every day | INTRAMUSCULAR | Status: DC

## 2021-11-24 MED ORDER — ASPIRIN 81 MG PO CHEW
324.0000 mg | CHEWABLE_TABLET | Freq: Once | ORAL | Status: AC
  Administered 2021-11-24: 324 mg via ORAL
  Filled 2021-11-24: qty 4

## 2021-11-24 MED ORDER — FENTANYL CITRATE (PF) 100 MCG/2ML IJ SOLN
INTRAMUSCULAR | Status: DC | PRN
  Administered 2021-11-24: 50 ug via INTRAVENOUS

## 2021-11-24 MED ORDER — HYDROXYZINE HCL 25 MG PO TABS: 25.0000 mg | ORAL_TABLET | Freq: Three times a day (TID) | ORAL | Status: DC | PRN

## 2021-11-24 MED ORDER — ALBUTEROL SULFATE (2.5 MG/3ML) 0.083% IN NEBU: 3.0000 mL | INHALATION_SOLUTION | RESPIRATORY_TRACT | Status: DC | PRN

## 2021-11-24 SURGICAL SUPPLY — 11 items
BAND ZEPHYR COMPRESS 30 LONG (HEMOSTASIS) IMPLANT
CATH 5FR JL3.5 JR4 ANG PIG MP (CATHETERS) IMPLANT
DRAPE BRACHIAL (DRAPES) IMPLANT
GLIDESHEATH SLEND SS 6F .021 (SHEATH) IMPLANT
GUIDEWIRE INQWIRE 1.5J.035X260 (WIRE) IMPLANT
INQWIRE 1.5J .035X260CM (WIRE) ×1
KIT ENCORE 26 ADVANTAGE (KITS) IMPLANT
PACK CARDIAC CATH (CUSTOM PROCEDURE TRAY) ×1 IMPLANT
PROTECTION STATION PRESSURIZED (MISCELLANEOUS) ×1
SET ATX SIMPLICITY (MISCELLANEOUS) IMPLANT
STATION PROTECTION PRESSURIZED (MISCELLANEOUS) IMPLANT

## 2021-11-24 NOTE — Progress Notes (Signed)
ANTICOAGULATION CONSULT NOTE  Pharmacy Consult for Heparin Indication: ACS/STEMI  Allergies  Allergen Reactions   Penicillins Anaphylaxis   Latex Swelling    Patient Measurements: Height: 5\' 10"  (177.8 cm) Weight: 86.2 kg (190 lb) IBW/kg (Calculated) : 68.5 Heparin Dosing Weight: 85.8 kg  Vital Signs: Temp: 98.9 F (37.2 C) (11/06 0853) Temp Source: Oral (11/06 0853) BP: 90/63 (11/06 0853) Pulse Rate: 92 (11/06 0853)  Labs: Recent Labs    11/24/21 0854  HGB 11.9*  HCT 35.1*  PLT 313  CREATININE 0.55  TROPONINIHS 233*    Estimated Creatinine Clearance: 110.4 mL/min (by C-G formula based on SCr of 0.55 mg/dL).   Medical History: Past Medical History:  Diagnosis Date   ADHD    Bipolar 1 disorder ([place])    OCD (obsessive compulsive disorder)    PTSD (post-traumatic stress disorder)     Medications:  No PTA anticoagulation  Assessment: 41 y.o F presenting to ED with report of SOB/Chest pain and found with elevated Troponin I (233)  Baseline Labs: Hgb 11.9, Plts 313  Goal of Therapy:  Heparin level 0.3-0.7 units/ml Monitor platelets by anticoagulation protocol: Yes   Plan:  Give 4000 units bolus x 1 Start heparin infusion at 1100 units/hr Check anti-Xa level in 6 hours and daily while on heparin Continue to monitor H&H and platelets  Alison Murray 11/24/2021,1:39 PM

## 2021-11-24 NOTE — ED Triage Notes (Signed)
Patient c/o SOB that started Friday. Emesis and fever Friday. Also reports chest pains and upper back pain. Dull and sharp chest pains.   A&O x4 in triage.

## 2021-11-24 NOTE — Progress Notes (Signed)
*  PRELIMINARY RESULTS* Echocardiogram 2D Echocardiogram has been performed.  Rebecca Duffy 11/24/2021, 3:11 PM

## 2021-11-24 NOTE — Consult Note (Signed)
Cardiology Consultation:   Patient ID: Rebecca Duffy; [id number]; October 08, 1980   Admit date: 11/24/2021 Date of Consult: 11/24/2021  Primary Care Provider: Patient, No Pcp Per Primary Cardiologist:  new - consult by Kirke Corin Primary Electrophysiologist:  None   Patient Profile:   Rebecca Duffy is a 41 y.o. female with a hx of bipolar, PTSD, OCD, ADHD, and alcohol/tobacco/cannabinoid use who is being seen today for the evaluation of NSTEMI at the request of Dr. Clyde Lundborg.  History of Present Illness:   Rebecca Duffy has no previously known cardiac history.  Several days leading up to her admission she reports one of her kids was sick with GI illness. On 11/3, the patient developed nausea, vomiting, diarrhea, fever, chills, and rigors. This was followed by the development of severe substernal chest pressure described as "someone taking a big spoon and digging." This pain has been constant. She also reports some intermittent sharp chest pain that is associated with deep inspiration. She reports an approximate 1 year history of exertional dyspnea that has not been associated with chest pain. She smoke marijuana and vapes on a daily basis. She does smoke tobacco when drinking alcohol, though is working on quitting alcohol, she was been without a drink for the past week. No family history of premature CAD.    Upon arrival to [facility], BP 90/63, 92 bpm, afebrile, oxygen saturation 98% on room air.  EKG demonstrated NSR, 89 bpm, no acute ST-T changes.  Initial high-sensitivity troponin 233, delta troponin pending.  WBC 11.2, Hgb 11.9, potassium 3.6, BUN 10, serum creatinine 0.55.  UDS pending.  Chest x-ray without acute cardiopulmonary process.  CTA chest negative for PE or other acute cardiopulmonary process.  In the ED, she received ASA 324 mg x 1 as well as DuoNebs.  Currently, she continues to note chest pressure. Her dyspnea is much improved following nebulizer therapy.     Past Medical History:  Diagnosis Date    ADHD    Bipolar 1 disorder (HCC)    OCD (obsessive compulsive disorder)    PTSD (post-traumatic stress disorder)     Past Surgical History:  Procedure Laterality Date   BREAST ENHANCEMENT SURGERY     CESAREAN SECTION     TUBAL LIGATION       Home Meds: Prior to Admission medications   Medication Sig Start Date End Date Taking? Authorizing Provider  calcium-vitamin D (OSCAL WITH D) 500-5 MG-MCG tablet Take 2 tablets by mouth daily with breakfast.   Yes [provider]  albuterol (PROVENTIL HFA;VENTOLIN HFA) 108 (90 Base) MCG/ACT inhaler Inhale 2 puffs into the lungs every 4 (four) hours as needed for wheezing or shortness of breath. 10/25/17   Cuthriell, Delorise Royals, PA-C  cariprazine (VRAYLAR) capsule Take 1 capsule (1.5 mg total) by mouth daily. Patient not taking: Reported on 11/24/2021 10/30/19   Money, Gerlene Burdock, FNP  fluticasone (FLONASE) 50 MCG/ACT nasal spray Place 1 spray into both nostrils 2 (two) times daily. Patient not taking: Reported on 10/30/2019 10/25/17   Cuthriell, Delorise Royals, PA-C  gabapentin (NEURONTIN) 100 MG capsule Take 1 capsule (100 mg total) by mouth 3 (three) times daily. Patient not taking: Reported on 11/24/2021 10/30/19   Money, Gerlene Burdock, FNP  hydrOXYzine (ATARAX/VISTARIL) 25 MG tablet Take 1 tablet (25 mg total) by mouth 3 (three) times daily as needed for anxiety. Patient not taking: Reported on 11/24/2021 10/30/19   Money, Gerlene Burdock, FNP  prazosin (MINIPRESS) 1 MG capsule Take 1 capsule (1 mg total) by  mouth at bedtime. Patient not taking: Reported on 11/24/2021 10/30/19   Money, Gerlene Burdock, FNP    Inpatient Medications: Scheduled Meds:  aspirin  324 mg Oral Once   [START ON 11/25/2021] aspirin EC  81 mg Oral Daily   heparin  4,000 Units Intravenous Once   nicotine  21 mg Transdermal Daily   Continuous Infusions:  heparin     PRN Meds: acetaminophen, albuterol, dextromethorphan-guaiFENesin, morphine injection, nitroGLYCERIN, ondansetron  (ZOFRAN) IV  Allergies:   Allergies  Allergen Reactions   Penicillins Anaphylaxis   Latex Swelling   Nsaids     Social History:   Social History   Socioeconomic History   Marital status: Divorced    Spouse name: Not on file   Number of children: Not on file   Years of education: Not on file   Highest education level: Not on file  Occupational History   Not on file  Tobacco Use   Smoking status: Some Days    Types: Cigarettes   Smokeless tobacco: Never  Vaping Use   Vaping Use: Every day  Substance and Sexual Activity   Alcohol use: Yes    Alcohol/week: 6.0 standard drinks of alcohol    Types: 6 Cans of beer per week   Drug use: Yes    Frequency: 2.0 times per week    Types: Marijuana   Sexual activity: Not on file  Other Topics Concern   Not on file  Social History Narrative   Not on file   Social Determinants of Health   Financial Resource Strain: Not on file  Food Insecurity: Not on file  Transportation Needs: Not on file  Physical Activity: Not on file  Stress: Not on file  Social Connections: Not on file  Intimate Partner Violence: Not on file     Family History:   Family History  Problem Relation Age of Onset   Healthy Mother    Other Father        unknown medical history    ROS:  Review of Systems  Constitutional:  Positive for chills, fever and malaise/fatigue. Negative for diaphoresis and weight loss.  HENT:  Negative for congestion.   Eyes:  Negative for discharge and redness.  Respiratory:  Positive for shortness of breath and wheezing. Negative for cough and sputum production.   Cardiovascular:  Positive for chest pain. Negative for palpitations, orthopnea, claudication, leg swelling and PND.  Gastrointestinal:  Positive for abdominal pain, diarrhea, heartburn, nausea and vomiting.  Musculoskeletal:  Positive for back pain. Negative for falls and myalgias.  Skin:  Negative for rash.  Neurological:  Negative for dizziness, tingling,  tremors, sensory change, speech change, focal weakness, loss of consciousness and weakness.  Endo/Heme/Allergies:  Does not bruise/bleed easily.  Psychiatric/Behavioral:  Negative for substance abuse. The patient is not nervous/anxious.   All other systems reviewed and are negative.     Physical Exam/Data:   Vitals:   11/24/21 0850 11/24/21 0853  BP:  90/63  Pulse:  92  Resp:  (!) 22  Temp:  98.9 F (37.2 C)  TempSrc:  Oral  SpO2:  98%  Weight: 86.2 kg   Height: 5\' 10"  (1.778 m)    No intake or output data in the 24 hours ending 11/24/21 1400 Filed Weights   11/24/21 0850  Weight: 86.2 kg   Body mass index is 27.26 kg/m.   Physical Exam: General: Well developed, well nourished, in no acute distress. Head: Normocephalic, atraumatic, sclera non-icteric, no xanthomas, nares  without discharge.  Neck: Negative for carotid bruits. JVD not elevated. Lungs: Clear bilaterally to auscultation without wheezes, rales, or rhonchi. Breathing is unlabored. Heart: RRR with S1 S2. No murmurs, rubs, or gallops appreciated. Abdomen: Soft, non-tender, non-distended with normoactive bowel sounds. No hepatomegaly. No rebound/guarding. No obvious abdominal masses. Msk:  Strength and tone appear normal for age. Extremities: No clubbing or cyanosis. No edema. Distal pedal pulses are 2+ and equal bilaterally. Neuro: Alert and oriented X 3. No facial asymmetry. No focal deficit. Moves all extremities spontaneously. Psych:  Responds to questions appropriately with a normal affect.   EKG:  The EKG was personally reviewed and demonstrates: NSR, 89 bpm, no acute ST-T changes Telemetry:  Telemetry was personally reviewed and demonstrates: not on tele  Weights: Filed Weights   11/24/21 0850  Weight: 86.2 kg    Relevant CV Studies:  None available for review.  Laboratory Data:  Chemistry Recent Labs  Lab 11/24/21 0854  NA 139  K 3.6  CL 111  CO2 22  GLUCOSE 101*  BUN 10  CREATININE  0.55  CALCIUM 8.5*  GFRNONAA >60  ANIONGAP 6    No results for input(s): "PROT", "ALBUMIN", "AST", "ALT", "ALKPHOS", "BILITOT" in the last 168 hours. Hematology Recent Labs  Lab 11/24/21 0854  WBC 11.2*  RBC 3.94  HGB 11.9*  HCT 35.1*  MCV 89.1  MCH 30.2  MCHC 33.9  RDW 12.8  PLT 313   Cardiac EnzymesNo results for input(s): "TROPONINI" in the last 168 hours. No results for input(s): "TROPIPOC" in the last 168 hours.  BNPNo results for input(s): "BNP", "PROBNP" in the last 168 hours.  DDimer No results for input(s): "DDIMER" in the last 168 hours.  Radiology/Studies:  CT Angio Chest PE W and/or Wo Contrast  Result Date: 11/24/2021 IMPRESSION: 1. Negative for acute pulmonary embolus, pneumonia or other acute cardiopulmonary process. Electronically Signed   By: Jacqulynn Cadet M.D.   On: 11/24/2021 13:14   DG Chest 2 View  Result Date: 11/24/2021 IMPRESSION: No acute cardiopulmonary process. Electronically Signed   By: Suzy Bouchard M.D.   On: 11/24/2021 09:32    Assessment and Plan:   1. NSTEMI: -Currently, continues to note substernal chest pressure described as "someone taking a big spoon and digging" -Initial high-sensitivity troponin 233, EKG nonacute  -Continue to cycle troponin until peak -Cannot exclude myocarditis given constellation of symptoms leading up to her presentation  -Heparin gtt -N.p.o. -Plan for LHC this afternoon -Obtain echo -Check A1c and lipid panel for further risk stratification -UDS pending -ASA  2.  Leukocytosis: -Possibly inflammatory in the setting of the above -Trend   3.  Alcohol/tobacco/THC use: -Complete cessation encouraged   Shared Decision Making/Informed Consent{  The risks [stroke (1 in 1000), death (1 in 1000), kidney failure [usually temporary] (1 in 500), bleeding (1 in 200), allergic reaction [possibly serious] (1 in 200)], benefits (diagnostic support and management of coronary artery disease) and alternatives  of a cardiac catheterization were discussed in detail with Ms. Berg and she is willing to proceed.    For questions or updates, please contact [place] Please consult [email or web address] for contact info under Cardiology/STEMI.   Signed, Christell Faith, PA-C [place] Pager: [phone] 11/24/2021, 2:00 PM

## 2021-11-24 NOTE — H&P (Signed)
History and Physical    Rebecca Duffy TDD:[id number] DOB: Jul 20, 1980 DOA: 11/24/2021  Referring MD/NP/PA:   PCP: Patient, No Pcp Per   Patient coming from:  The patient is coming from home.  At baseline, pt is independent for most of ADL.        Chief Complaint:  chest pain and SOB  HPI: Rebecca Duffy is a 41 y.o. female with medical history significant of bipolar disorder, PTSD, OCD, ADHD, alcohol use, bleeding gastric ulcer, tobacco abuse, cannabinoid use disorder, who presents with chest pain and shortness breath.   Patient states that he has viral-like symptoms GI symptoms since Friday, described as multiple episodes of nausea, nonbilious nonbloody vomiting, diarrhea, mild abdominal pain, fever and chills.  She states that the symptoms have been improving. She developed shortness of breath and chest pain since yesterday.  Her chest pain is located substernal area, moderate to severe, dull, also pleuritic, aggravated by deep breath, radiated to the upper back.  Patient has cough with little mucus production.  Her fever and chills has resolved.  She has had 1-2 times of watery diarrhea since yesterday which has significantly improved.  Currently no vomiting.    Data reviewed independently and ED Course: pt was found to have troponin 233, GFR> 60, blood pressure 90/63, heart rate 92, RR 22, oxygen saturation 98% on room air, temperature normal.  Chest x-ray negative.  CTA negative for PE.  Patient is admitted to telemetry bed as inpatient.  Dr. Fletcher Anon of cardiology is consulted.   EKG: I have personally reviewed.  Sinus rhythm, QTc 430, poor R wave question, no ischemic change.   Review of Systems:   General: no fevers, chills, no body weight gain, has poor appetite, has fatigue HEENT: no blurry vision, hearing changes or sore throat Respiratory: has dyspnea, coughing, wheezing CV: has chest pain, no palpitations GI: has nausea, vomiting, abdominal pain, diarrhea, no constipation GU: no  dysuria, burning on urination, increased urinary frequency, hematuria  Ext: no leg edema Neuro: no unilateral weakness, numbness, or tingling, no vision change or hearing loss Skin: no rash, no skin tear. MSK: No muscle spasm, no deformity, no limitation of range of movement in spin Heme: No easy bruising.  Travel history: No recent long distant travel.   Allergy:  Allergies  Allergen Reactions   Penicillins Anaphylaxis   Latex Swelling   Nsaids     Past Medical History:  Diagnosis Date   ADHD    Bipolar 1 disorder (HCC)    Gastric ulcer    OCD (obsessive compulsive disorder)    PTSD (post-traumatic stress disorder)     Past Surgical History:  Procedure Laterality Date   BREAST ENHANCEMENT SURGERY     CESAREAN SECTION     TUBAL LIGATION      Social History:  reports that she has been smoking cigarettes. She has never used smokeless tobacco. She reports current alcohol use of about 6.0 standard drinks of alcohol per week. She reports current drug use. Frequency: 2.00 times per week. Drug: Marijuana.  Family History:  Family History  Problem Relation Age of Onset   Healthy Mother    Other Father        unknown medical history     Prior to Admission medications   Medication Sig Start Date End Date Taking? Authorizing Provider  albuterol (PROVENTIL HFA;VENTOLIN HFA) 108 (90 Base) MCG/ACT inhaler Inhale 2 puffs into the lungs every 4 (four) hours as needed for wheezing or shortness of breath.  Patient not taking: Reported on 10/30/2019 10/25/17   Cuthriell, Charline Bills, PA-C  cariprazine (VRAYLAR) capsule Take 1 capsule (1.5 mg total) by mouth daily. 10/30/19   Money, Lowry Ram, FNP  fluticasone (FLONASE) 50 MCG/ACT nasal spray Place 1 spray into both nostrils 2 (two) times daily. Patient not taking: Reported on 10/30/2019 10/25/17   Cuthriell, Charline Bills, PA-C  gabapentin (NEURONTIN) 100 MG capsule Take 1 capsule (100 mg total) by mouth 3 (three) times daily. 10/30/19    Money, Lowry Ram, FNP  hydrOXYzine (ATARAX/VISTARIL) 25 MG tablet Take 1 tablet (25 mg total) by mouth 3 (three) times daily as needed for anxiety. 10/30/19   Money, Lowry Ram, FNP  prazosin (MINIPRESS) 1 MG capsule Take 1 capsule (1 mg total) by mouth at bedtime. 10/30/19   Money, Lowry Ram, FNP    Physical Exam: Vitals:   11/24/21 0850 11/24/21 0853 11/24/21 1413  BP:  90/63   Pulse:  92   Resp:  (!) 22   Temp:  98.9 F (37.2 C) 98.5 F (36.9 C)  TempSrc:  Oral Oral  SpO2:  98%   Weight: 86.2 kg    Height: 5\' 10"  (1.778 m)     General: Not in acute distress HEENT:       Eyes: PERRL, EOMI, no scleral icterus.       ENT: No discharge from the ears and nose, no pharynx injection, no tonsillar enlargement.        Neck: No JVD, no bruit, no mass felt. Heme: No neck lymph node enlargement. Cardiac: S1/S2, RRR, No murmurs, No gallops or rubs. Respiratory: No rales, wheezing, rhonchi or rubs. GI: Soft, nondistended, nontender, no rebound pain, no organomegaly, BS present. GU: No hematuria Ext: No pitting leg edema bilaterally. 1+DP/PT pulse bilaterally. Musculoskeletal: No joint deformities, No joint redness or warmth, no limitation of ROM in spin. Skin: No rashes.  Neuro: Alert, oriented X3, cranial nerves II-XII grossly intact, moves all extremities normally.  Psych: Patient is not psychotic, no suicidal or hemocidal ideation.  Labs on Admission: I have personally reviewed following labs and imaging studies  CBC: Recent Labs  Lab 11/24/21 0854  WBC 11.2*  HGB 11.9*  HCT 35.1*  MCV 89.1  PLT [id number]   Basic Metabolic Panel: Recent Labs  Lab 11/24/21 0854  NA 139  K 3.6  CL 111  CO2 22  GLUCOSE 101*  BUN 10  CREATININE 0.55  CALCIUM 8.5*   GFR: Estimated Creatinine Clearance: 110.4 mL/min (by C-G formula based on SCr of 0.55 mg/dL). Liver Function Tests: No results for input(s): "AST", "ALT", "ALKPHOS", "BILITOT", "PROT", "ALBUMIN" in the last 168 hours. No results  for input(s): "LIPASE", "AMYLASE" in the last 168 hours. No results for input(s): "AMMONIA" in the last 168 hours. Coagulation Profile: No results for input(s): "INR", "PROTIME" in the last 168 hours. Cardiac Enzymes: No results for input(s): "CKTOTAL", "CKMB", "CKMBINDEX", "TROPONINI" in the last 168 hours. BNP (last 3 results) No results for input(s): "PROBNP" in the last 8760 hours. HbA1C: No results for input(s): "HGBA1C" in the last 72 hours. CBG: No results for input(s): "GLUCAP" in the last 168 hours. Lipid Profile: No results for input(s): "CHOL", "HDL", "LDLCALC", "TRIG", "CHOLHDL", "LDLDIRECT" in the last 72 hours. Thyroid Function Tests: No results for input(s): "TSH", "T4TOTAL", "FREET4", "T3FREE", "THYROIDAB" in the last 72 hours. Anemia Panel: No results for input(s): "VITAMINB12", "FOLATE", "FERRITIN", "TIBC", "IRON", "RETICCTPCT" in the last 72 hours. Urine analysis:    Component Value Date/Time  COLORURINE YELLOW (A) 12/05/2018 1022   APPEARANCEUR HAZY (A) 12/05/2018 1022   LABSPEC 1.016 12/05/2018 1022   PHURINE 5.0 12/05/2018 1022   GLUCOSEU NEGATIVE 12/05/2018 1022   HGBUR LARGE (A) 12/05/2018 1022   BILIRUBINUR NEGATIVE 12/05/2018 1022   KETONESUR NEGATIVE 12/05/2018 1022   PROTEINUR NEGATIVE 12/05/2018 1022   NITRITE NEGATIVE 12/05/2018 1022   LEUKOCYTESUR NEGATIVE 12/05/2018 1022   Sepsis Labs: @LABRCNTIP (procalcitonin:4,lacticidven:4) )No results found for this or any previous visit (from the past 240 hour(s)).   Radiological Exams on Admission: CT Angio Chest PE W and/or Wo Contrast  Result Date: 11/24/2021 CLINICAL DATA:  Shortness of breath, pulmonary embolism suspected EXAM: CT ANGIOGRAPHY CHEST WITH CONTRAST TECHNIQUE: Multidetector CT imaging of the chest was performed using the standard protocol during bolus administration of intravenous contrast. Multiplanar CT image reconstructions and MIPs were obtained to evaluate the vascular anatomy.  RADIATION DOSE REDUCTION: This exam was performed according to the departmental dose-optimization program which includes automated exposure control, adjustment of the mA and/or kV according to patient size and/or use of iterative reconstruction technique. CONTRAST:  170mL OMNIPAQUE IOHEXOL 350 MG/ML SOLN COMPARISON:  None Available. FINDINGS: Cardiovascular: Satisfactory opacification of the pulmonary arteries to the segmental level. No evidence of pulmonary embolism. Normal heart size. No pericardial effusion. Variant arch anatomy. The right brachiocephalic and left common carotid artery share a common origin while the left vertebral artery arises directly from the aorta. Mediastinum/Nodes: No enlarged mediastinal, hilar, or axillary lymph nodes. Thyroid gland, trachea, and esophagus demonstrate no significant findings. Lungs/Pleura: Lungs are clear. No pleural effusion or pneumothorax. Upper Abdomen: No acute abnormality. Musculoskeletal: No chest wall abnormality. No acute or significant osseous findings. Bilateral breast augmentation prostheses. Metallic adornments are also present. Review of the MIP images confirms the above findings. IMPRESSION: 1. Negative for acute pulmonary embolus, pneumonia or other acute cardiopulmonary process. Electronically Signed   By: Jacqulynn Cadet M.D.   On: 11/24/2021 13:14   DG Chest 2 View  Result Date: 11/24/2021 CLINICAL DATA:  Chest pain and short of breath EXAM: CHEST - 2 VIEW COMPARISON:  None Available. FINDINGS: Normal mediastinum and cardiac silhouette. Normal pulmonary vasculature. No evidence of effusion, infiltrate, or pneumothorax. No acute bony abnormality. IMPRESSION: No acute cardiopulmonary process. Electronically Signed   By: Suzy Bouchard M.D.   On: 11/24/2021 09:32      Assessment/Plan Active Problems:   NSTEMI (non-ST elevated myocardial infarction) ([place])   Gastric ulcer   Gastroenteritis   Tobacco abuse   Alcohol use   Bipolar I  disorder, most recent episode depressed ([place])   Assessment and Plan:  NSTEMI (non-ST elevated myocardial infarction) ([place]): trop 233. CTA negative for PE.  Patient may have non-STEMI.  Another differential diagnosis is myopericarditis given recent viral symptoms. Consulted Dr. Fletcher Anon of card  -admit to tele bed as inpt - IV heparin - Trend Trop - prn Nitroglycerin, Morphine - start aspirin, lipitor  - Risk factor stratification: will check FLP and A1C  - check UDS - 2d echo  Hx of gastric ulcer: Patient reports bleeding ulcer in the past -will change Protonix to 40 mg twice daily due to history of bleeding ulcer and aspirin use  Gastroenteritis: Patient likely has viral gastroenteritis recently -Check GI pathogen panel  Tobacco abuse -Did counseling about importance of quitting tobacco use -Nicotine patch  Alcohol use -CIWA protocol  Bipolar I disorder, most recent episode depressed ([place]) -As needed hydroxyzine    DVT ppx: in IV Heparin  Code Status: Full code  Family Communication:     Yes, patient's mother and daughter    at bed side.    Disposition Plan:  Anticipate discharge back to previous environment  Consults called: Dr. Fletcher Anon of cardiology  Admission status and Level of care: Telemetry Cardiac:    as inpt        Dispo: The patient is from: Home              Anticipated d/c is to: Home              Anticipated d/c date is: 2 days              Patient currently is not medically stable to d/c.    Severity of Illness:  The appropriate patient status for this patient is INPATIENT. Inpatient status is judged to be reasonable and necessary in order to provide the required intensity of service to ensure the patient's safety. The patient's presenting symptoms, physical exam findings, and initial radiographic and laboratory data in the context of their chronic comorbidities is felt to place them at high risk for further clinical deterioration. Furthermore,  it is not anticipated that the patient will be medically stable for discharge from the hospital within 2 midnights of admission.   * I certify that at the point of admission it is my clinical judgment that the patient will require inpatient hospital care spanning beyond 2 midnights from the point of admission due to high intensity of service, high risk for further deterioration and high frequency of surveillance required.*       Date of Service 11/24/2021    Ivor Costa [facility]   If 7PM-7AM, please contact night-coverage [email or web address] 11/24/2021, 2:26 PM

## 2021-11-24 NOTE — ED Provider Notes (Signed)
[facility] Provider Note    Event Date/Time   First MD Initiated Contact with Patient 11/24/21 1326     (approximate)   History   Shortness of Breath and Chest Pain   HPI  Rebecca Duffy is a 41 y.o. female who presents with complaints of shortness of breath and some chest discomfort.  This started yesterday.  She reports she had a GI illness and did have nausea and vomiting which improved, yesterday felt mild pressure in her chest intermittently.  Today feels short of breath, no significant cough.  No calf pain or swelling.  No history of blood clots     Physical Exam   Triage Vital Signs: ED Triage Vitals  Enc Vitals Group     BP 11/24/21 0853 90/63     Pulse Rate 11/24/21 0853 92     Resp 11/24/21 0853 (!) 22     Temp 11/24/21 0853 98.9 F (37.2 C)     Temp Source 11/24/21 0853 Oral     SpO2 11/24/21 0853 98 %     Weight 11/24/21 0850 86.2 kg (190 lb)     Height 11/24/21 0850 1.778 m (5\' 10" )     Head Circumference --      Peak Flow --      Pain Score 11/24/21 0849 4     Pain Loc --      Pain Edu? --      Excl. in GC? --     Most recent vital signs: Vitals:   11/24/21 0853 11/24/21 1413  BP: 90/63   Pulse: 92   Resp: (!) 22   Temp: 98.9 F (37.2 C) 98.5 F (36.9 C)  SpO2: 98%      General: Awake, no distress.  CV:  Good peripheral perfusion.  Regular rate and rhythm Resp:  Normal effort.  Scattered mild wheezes Abd:  No distention.  Other:  No calf pain or swelling   ED Results / Procedures / Treatments   Labs (all labs ordered are listed, but only abnormal results are displayed) Labs Reviewed  BASIC METABOLIC PANEL - Abnormal; Notable for the following components:      Result Value   Glucose, Bld 101 (*)    Calcium 8.5 (*)    All other components within normal limits  CBC - Abnormal; Notable for the following components:   WBC 11.2 (*)    Hemoglobin 11.9 (*)    HCT 35.1 (*)    All other components within normal  limits  TROPONIN I (HIGH SENSITIVITY) - Abnormal; Notable for the following components:   Troponin I (High Sensitivity) 233 (*)    All other components within normal limits  SARS CORONAVIRUS 2 BY RT PCR  GASTROINTESTINAL PANEL BY PCR, STOOL (REPLACES STOOL CULTURE)  URINE DRUG SCREEN, QUALITATIVE ([facility] ONLY)  HEMOGLOBIN A1C  PREGNANCY, URINE  HIV ANTIBODY (ROUTINE TESTING W REFLEX)  PROTIME-INR  BRAIN NATRIURETIC PEPTIDE     EKG  ED ECG REPORT I, 04-19-1978, the attending physician, personally viewed and interpreted this ECG.  Date: 11/24/2021  Rhythm: normal sinus rhythm QRS Axis: normal Intervals: normal ST/T Wave abnormalities: normal Narrative Interpretation: no evidence of acute ischemia    RADIOLOGY Chest x-ray viewed interpreted by me, no pneumonia    PROCEDURES:  Critical Care performed: yes  CRITICAL CARE Performed by: 13/06/2021   Total critical care time: 30 minutes  Critical care time was exclusive of separately billable procedures and treating other patients.  Critical care was necessary to treat or prevent imminent or life-threatening deterioration.  Critical care was time spent personally by me on the following activities: development of treatment plan with patient and/or surrogate as well as nursing, discussions with consultants, evaluation of patient's response to treatment, examination of patient, obtaining history from patient or surrogate, ordering and performing treatments and interventions, ordering and review of laboratory studies, ordering and review of radiographic studies, pulse oximetry and re-evaluation of patient's condition.   Procedures   MEDICATIONS ORDERED IN ED: Medications  aspirin EC tablet 81 mg (has no administration in time range)  nitroGLYCERIN (NITROSTAT) SL tablet 0.4 mg (has no administration in time range)  morphine (PF) 2 MG/ML injection 2 mg (has no administration in time range)  heparin ADULT infusion 100  units/mL (25000 units/273mL) (1,100 Units/hr Intravenous New Bag/Given 11/24/21 1405)  albuterol (PROVENTIL) (2.5 MG/3ML) 0.083% nebulizer solution 3 mL (has no administration in time range)  dextromethorphan-guaiFENesin (MUCINEX DM) 30-600 MG per 12 hr tablet 1 tablet (has no administration in time range)  nicotine (NICODERM CQ - dosed in mg/24 hours) patch 21 mg (21 mg Transdermal Patch Applied 11/24/21 1401)  ondansetron (ZOFRAN) injection 4 mg (has no administration in time range)  acetaminophen (TYLENOL) tablet 650 mg (has no administration in time range)  hydrOXYzine (ATARAX) tablet 25 mg (has no administration in time range)  pantoprazole (PROTONIX) EC tablet 40 mg (has no administration in time range)  atorvastatin (LIPITOR) tablet 40 mg (has no administration in time range)  LORazepam (ATIVAN) tablet 1-4 mg (has no administration in time range)    Or  LORazepam (ATIVAN) injection 1-4 mg (has no administration in time range)  thiamine (VITAMIN B1) tablet 100 mg (has no administration in time range)    Or  thiamine (VITAMIN B1) injection 100 mg (has no administration in time range)  folic acid (FOLVITE) tablet 1 mg (has no administration in time range)  multivitamin with minerals tablet 1 tablet (has no administration in time range)  LORazepam (ATIVAN) injection 0-4 mg (has no administration in time range)    Followed by  LORazepam (ATIVAN) injection 0-4 mg (has no administration in time range)  0.9 %  sodium chloride infusion (has no administration in time range)  ipratropium-albuterol (DUONEB) 0.5-2.5 (3) MG/3ML nebulizer solution 3 mL (3 mLs Nebulization Given 11/24/21 0950)  ipratropium-albuterol (DUONEB) 0.5-2.5 (3) MG/3ML nebulizer solution 3 mL (3 mLs Nebulization Given 11/24/21 0949)  iohexol (OMNIPAQUE) 350 MG/ML injection 100 mL (100 mLs Intravenous Contrast Given 11/24/21 1307)  aspirin chewable tablet 324 mg (324 mg Oral Given 11/24/21 1356)  heparin bolus via infusion 4,000  Units (4,000 Units Intravenous Bolus from Bag 11/24/21 1405)     IMPRESSION / MDM / ASSESSMENT AND PLAN / ED COURSE  I reviewed the triage vital signs and the nursing notes. Patient's presentation is most consistent with acute presentation with potential threat to life or bodily function.   Patient presents with shortness of breath, chest discomfort status post illness as detailed above.  Differential includes viral illness, pneumonia, less likely ACS, PE, myocarditis  Notified of elevated troponin of 233, EKG is reassuring, this raises the possibility of PE, will send for CT angiography  ----------------------------------------- 1:29 PM on 11/24/2021 ----------------------------------------- CT scan is negative for PE.  We will start the patient on heparin GTT, possibility for myocarditis, will discuss with the hospitalist for admission     FINAL CLINICAL IMPRESSION(S) / ED DIAGNOSES   Final diagnoses:  NSTEMI (non-ST elevated myocardial  infarction) [facility])     Rx / DC Orders   ED Discharge Orders     None        Note:  This document was prepared using Dragon voice recognition software and may include unintentional dictation errors.   Lavonia Drafts, MD 11/24/21 [phone]

## 2021-11-24 NOTE — ED Notes (Signed)
Call from lab: Critical troponin of 233. MD Corky Downs Notified

## 2021-11-25 ENCOUNTER — Encounter: Payer: Self-pay | Admitting: Cardiovascular Disease

## 2021-11-25 DIAGNOSIS — Z720 Tobacco use: Secondary | ICD-10-CM | POA: Diagnosis not present

## 2021-11-25 DIAGNOSIS — K529 Noninfective gastroenteritis and colitis, unspecified: Secondary | ICD-10-CM | POA: Diagnosis not present

## 2021-11-25 DIAGNOSIS — Z789 Other specified health status: Secondary | ICD-10-CM | POA: Diagnosis not present

## 2021-11-25 DIAGNOSIS — I214 Non-ST elevation (NSTEMI) myocardial infarction: Secondary | ICD-10-CM | POA: Diagnosis not present

## 2021-11-25 LAB — BASIC METABOLIC PANEL
Anion gap: 5 (ref 5–15)
BUN: 10 mg/dL (ref 6–20)
CO2: 23 mmol/L (ref 22–32)
Calcium: 8.3 mg/dL — ABNORMAL LOW (ref 8.9–10.3)
Chloride: 111 mmol/L (ref 98–111)
Creatinine, Ser: 0.63 mg/dL (ref 0.44–1.00)
GFR, Estimated: >60 mL/min (ref >60)
Glucose, Bld: 101 mg/dL — ABNORMAL HIGH (ref 70–99)
Potassium: 3.3 mmol/L — ABNORMAL LOW (ref 3.5–5.1)
Sodium: 139 mmol/L (ref 135–145)

## 2021-11-25 LAB — CBC
HCT: 33.1 % — ABNORMAL LOW (ref 36.0–46.0)
Hemoglobin: 11 g/dL — ABNORMAL LOW (ref 12.0–15.0)
MCH: 29.7 pg (ref 26.0–34.0)
MCHC: 33.2 g/dL (ref 30.0–36.0)
MCV: 89.5 fL (ref 80.0–100.0)
Platelets: 289 10*3/uL (ref 150–400)
RBC: 3.7 MIL/uL — ABNORMAL LOW (ref 3.87–5.11)
RDW: 12.9 % (ref 11.5–15.5)
WBC: 7.6 10*3/uL (ref 4.0–10.5)
nRBC: 0 % (ref 0.0–0.2)

## 2021-11-25 LAB — LIPID PANEL
Cholesterol: 131 mg/dL (ref 0–200)
HDL: 33 mg/dL — ABNORMAL LOW (ref >40)
LDL Cholesterol: 76 mg/dL (ref 0–99)
Total CHOL/HDL Ratio: 4 RATIO
Triglycerides: 110 mg/dL (ref <150)
VLDL: 22 mg/dL (ref 0–40)

## 2021-11-25 LAB — HIV ANTIBODY (ROUTINE TESTING W REFLEX): HIV Screen 4th Generation wRfx: NONREACTIVE

## 2021-11-25 LAB — TROPONIN I (HIGH SENSITIVITY): Troponin I (High Sensitivity): 97 ng/L — ABNORMAL HIGH (ref <18)

## 2021-11-25 LAB — GLUCOSE, CAPILLARY: Glucose-Capillary: 106 mg/dL — ABNORMAL HIGH (ref 70–99)

## 2021-11-25 MED ORDER — ADULT MULTIVITAMIN W/MINERALS CH: 1.0000 | ORAL_TABLET | Freq: Every day | ORAL | 1 refills | Status: DC

## 2021-11-25 MED ORDER — VITAMIN B-1 100 MG PO TABS: 100.0000 mg | ORAL_TABLET | Freq: Every day | ORAL | 1 refills | Status: DC

## 2021-11-25 MED ORDER — NICOTINE 21 MG/24HR TD PT24: 21.0000 mg | MEDICATED_PATCH | Freq: Every day | TRANSDERMAL | 0 refills | Status: DC

## 2021-11-25 MED ORDER — COLCHICINE 0.6 MG PO TABS: 0.6000 mg | ORAL_TABLET | Freq: Two times a day (BID) | ORAL | 0 refills | Status: DC

## 2021-11-25 MED ORDER — ALBUTEROL SULFATE HFA 108 (90 BASE) MCG/ACT IN AERS: 2.0000 | INHALATION_SPRAY | RESPIRATORY_TRACT | 0 refills | Status: DC | PRN

## 2021-11-25 MED ORDER — PANTOPRAZOLE SODIUM 40 MG PO TBEC: 40.0000 mg | DELAYED_RELEASE_TABLET | Freq: Every day | ORAL | 1 refills | Status: DC

## 2021-11-25 MED ORDER — FOLIC ACID 1 MG PO TABS: 1.0000 mg | ORAL_TABLET | Freq: Every day | ORAL | 1 refills | Status: DC

## 2021-11-25 MED ORDER — DM-GUAIFENESIN ER 30-600 MG PO TB12: 1.0000 | ORAL_TABLET | Freq: Two times a day (BID) | ORAL | 0 refills | Status: DC | PRN

## 2021-11-25 NOTE — Progress Notes (Signed)
Progress Note  Patient Name: Esme Freund Date of Encounter: 11/25/2021  Primary Cardiologist: new - consult by Fletcher Anon  Subjective   LHC yesterday showed normal coronary arteries. Echo with preserved LVSF. Being treated for myocarditis. Continues to report chest discomfort that is unchanged. No dyspnea or palpitations. No right radial arteriotomy site complications.   Inpatient Medications    Scheduled Meds:  aspirin EC  81 mg Oral Daily   colchicine  0.6 mg Oral BID   folic acid  1 mg Oral Daily   LORazepam  0-4 mg Intravenous Q6H   Followed by   Derrill Memo ON 11/26/2021] LORazepam  0-4 mg Intravenous Q12H   multivitamin with minerals  1 tablet Oral Daily   nicotine  21 mg Transdermal Daily   pantoprazole  40 mg Oral BID   sodium chloride flush  3 mL Intravenous Q12H   thiamine  100 mg Oral Daily   Or   thiamine  100 mg Intravenous Daily   Continuous Infusions:  sodium chloride 75 mL/hr at 11/24/21 2051   sodium chloride     PRN Meds: sodium chloride, acetaminophen, albuterol, dextromethorphan-guaiFENesin, hydrOXYzine, LORazepam **OR** LORazepam, morphine injection, nitroGLYCERIN, ondansetron (ZOFRAN) IV, sodium chloride flush   Vital Signs    Vitals:   11/24/21 2013 11/24/21 2345 11/25/21 0401 11/25/21 0830  BP: 119/72 117/74 114/78 114/76  Pulse: 94 89 84 83  Resp: 20 20 20 16   Temp: 98.5 F (36.9 C) 99 F (37.2 C) 98.8 F (37.1 C) 98.4 F (36.9 C)  TempSrc:  Oral Oral   SpO2: 100% 98% 98% 100%  Weight:      Height:       No intake or output data in the 24 hours ending 11/25/21 1015 Filed Weights   11/24/21 0850 11/24/21 1547  Weight: 86.2 kg 86.2 kg    Telemetry    SR - Personally Reviewed  ECG    No new tracings - Personally Reviewed  Physical Exam   GEN: No acute distress.   Neck: No JVD. Cardiac: RRR, no murmurs, rubs, or gallops. Right radial arteriotomy site is without active bleeding, swelling, warmth, erythema, or TTP.  Respiratory:  Clear to auscultation bilaterally.  GI: Soft, nontender, non-distended.   MS: No edema; No deformity. Neuro:  Alert and oriented x 3; Nonfocal.  Psych: Normal affect.  Labs    Chemistry Recent Labs  Lab 11/24/21 0854 11/25/21 0135  NA 139 139  K 3.6 3.3*  CL 111 111  CO2 22 23  GLUCOSE 101* 101*  BUN 10 10  CREATININE 0.55 0.63  CALCIUM 8.5* 8.3*  GFRNONAA >60 >60  ANIONGAP 6 5     Hematology Recent Labs  Lab 11/24/21 0854 11/25/21 0135  WBC 11.2* 7.6  RBC 3.94 3.70*  HGB 11.9* 11.0*  HCT 35.1* 33.1*  MCV 89.1 89.5  MCH 30.2 29.7  MCHC 33.9 33.2  RDW 12.8 12.9  PLT 313 289    Cardiac EnzymesNo results for input(s): "TROPONINI" in the last 168 hours. No results for input(s): "TROPIPOC" in the last 168 hours.   BNP Recent Labs  Lab 11/24/21 1949  BNP 239.9*     DDimer No results for input(s): "DDIMER" in the last 168 hours.   Radiology    CT Angio Chest PE W and/or Wo Contrast  Result Date: 11/24/2021 IMPRESSION: 1. Negative for acute pulmonary embolus, pneumonia or other acute cardiopulmonary process. Electronically Signed   By: Jacqulynn Cadet M.D.   On: 11/24/2021 13:14  DG Chest 2 View  Result Date: 11/24/2021 IMPRESSION: No acute cardiopulmonary process. Electronically Signed   By: Genevive Bi M.D.   On: 11/24/2021 09:32    Cardiac Studies   2D echo 11/24/2021: 1. Left ventricular ejection fraction, by estimation, is 60 to 65%. The  left ventricle has normal function. The left ventricle has no regional  wall motion abnormalities. Left ventricular diastolic parameters were  normal.   2. Right ventricular systolic function is normal. The right ventricular  size is normal. Tricuspid regurgitation signal is inadequate for assessing  PA pressure.   3. The mitral valve is normal in structure. No evidence of mitral valve  regurgitation. No evidence of mitral stenosis.   4. The aortic valve is normal in structure. Aortic valve regurgitation  is  not visualized. No aortic stenosis is present.   5. The inferior vena cava is normal in size with greater than 50%  respiratory variability, suggesting right atrial pressure of 3 mmHg.  __________  LHC 11/24/2021: 1.  Normal coronary arteries. 2.  Left ventricular angiography was not performed EF was normal by echo.  Mildly elevated left ventricular end-diastolic pressure.   Recommendations: Suspect that the patient's presentation is likely due to mild myocarditis.  Based on cardiac catheterization, this was not a myocardial infarction.  No need for a statin beta-blocker or cardiac rehab. I added colchicine to be used for 1 week. Suspect that the patient can likely be discharged home tomorrow if no other issues.  Patient Profile     41 y.o. female with history of bipolar, PTSD, OCD, ADHD, and alcohol/tobacco/cannabinoid use who is being seen today for the evaluation of NSTEMI at the request of Dr. Clyde Lundborg.   Assessment & Plan    1. Elevated troponin secondary to myopericarditis: -LHC 11/6 with normal coronary arteries  -Initial and peak high-sensitivity troponin 233, EKG notable for possible pericarditis -Colchicine 0.6 mg bid for one week -Can stop ASA   -Echo as above -Check A1c and lipid panel for further risk stratification -UDS remains pending -Not a NSTEMI, no need for statin, beta blocker, or cardiac rehab -Ambulate    2.  Leukocytosis: -Possibly inflammatory in the setting of the above -Trend    3.  Alcohol/tobacco/THC use: -Complete cessation encouraged    For questions or updates, please contact [facility] Please consult [email or web address] for contact info under Cardiology/STEMI.    Signed, Eula Listen, PA-C [facility] Pager: [phone] 11/25/2021, 10:15 AM

## 2021-11-25 NOTE — Hospital Course (Addendum)
Taken from H&P.   Rebecca Duffy is a 41 y.o. female with medical history significant of bipolar disorder, PTSD, OCD, ADHD, alcohol use, bleeding gastric ulcer, tobacco abuse, cannabinoid use disorder, who presents with chest pain and shortness breath.    Patient states that he has viral-like symptoms GI symptoms since Friday, described as multiple episodes of nausea, nonbilious nonbloody vomiting, diarrhea, mild abdominal pain, fever and chills.  She states that the symptoms have been improving. She developed shortness of breath and chest pain since yesterday.  Her chest pain is located substernal area, moderate to severe, dull, also pleuritic, aggravated by deep breath, radiated to the upper back.  Patient has cough with little mucus production.  Her fever and chills has resolved.  She has had 1-2 times of watery diarrhea since yesterday which has significantly improved.  Currently no vomiting.   Data reviewed independently and ED Course: pt was found to have troponin 233, BNP 239 GFR> 60, blood pressure 90/63, heart rate 92, RR 22, oxygen saturation 98% on room air, temperature normal.  Chest x-ray negative.  CTA negative for PE.  EKG: I have personally reviewed.  Sinus rhythm, QTc 430, poor R wave question, no ischemic change.  Cardiology was consulted.  11/7: Troponin peaked at 233 and started trending down.  Echocardiogram was within normal limit.  Due to persistent chest pain she was taken for cardiac catheterization which shows normal coronary arteries, mildly elevated left ventricular end-diastolic pressure. Per cardiology her presentation likely due to mild myocarditis and no myocardial infarction or NSTEMI. No need for statin, beta-blocker or cardiac rehab. Patient was started on colchicine for 1 week. Lipid panel within normal limits with HDL of 33 and LDL of 76, A1c of 4.8.  Patient continued to have some pleuritic chest pain.  She was given colchicine to be used twice daily for 1 week and  after that as needed and need to have a follow-up with her cardiologist for further recommendations. Patient does not have NSTEMI, chest pain most likely secondary to myopericarditis.  Patient was also counseled against smoking and alcohol use.  She will continue on current medications and follow-up with her providers.

## 2021-11-25 NOTE — TOC Initial Note (Signed)
Transition of Care [facility]) - Initial/Assessment Note    Patient Details  Name: Rebecca Duffy MRN: [id number] Date of Birth: 12/04/1980  [facility]) CM/SW Contact:    Tiburcio Bash, LCSW Phone Number: 11/25/2021, 11:27 AM  Clinical Narrative:                  [facility] consult for SA resources, patient declines. Patient reports no PCP states she does not go to the doctor but reports her kids go to [place], reports she plans on becoming established there. Patient has insurance. Reports no dc needs at this time.   Expected Discharge Plan: Home/Self Care Barriers to Discharge: No Barriers Identified   Patient Goals and CMS Choice     Choice offered to / list presented to : Patient  Expected Discharge Plan and Services Expected Discharge Plan: Home/Self Care       Living arrangements for the past 2 months: Single Family Home Expected Discharge Date: 11/25/21                                    Prior Living Arrangements/Services Living arrangements for the past 2 months: Single Family Home Lives with:: Self                   Activities of Daily Living Home Assistive Devices/Equipment: None ADL Screening (condition at time of admission) Patient's cognitive ability adequate to safely complete daily activities?: Yes Is the patient deaf or have difficulty hearing?: No Does the patient have difficulty seeing, even when wearing glasses/contacts?: No Does the patient have difficulty concentrating, remembering, or making decisions?: No Patient able to express need for assistance with ADLs?: Yes Does the patient have difficulty dressing or bathing?: No Independently performs ADLs?: Yes (appropriate for developmental age) Does the patient have difficulty walking or climbing stairs?: No Weakness of Legs: None Weakness of Arms/Hands: None  Permission Sought/Granted                  Emotional Assessment              Admission diagnosis:  NSTEMI  (non-ST elevated myocardial infarction) ([place]) [I21.4] Chest pain [R07.9] Patient Active Problem List   Diagnosis Date Noted   Chest pain 11/24/2021   Leukocytosis 11/24/2021   NSTEMI (non-ST elevated myocardial infarction) ([place]) 11/24/2021   Tobacco abuse 11/24/2021   Gastric ulcer 11/24/2021   Alcohol use 11/24/2021   Gastroenteritis 11/24/2021   Bipolar I disorder, most recent episode depressed ([place])    Alcohol use disorder, severe, dependence ([place])    Cannabis use disorder, severe, dependence ([place])    PCP:  Patient, No Pcp Per Pharmacy:   [id number] - [place], [place] [address] [place] [address] Phone: [phone] Fax: [phone]     Social Determinants of Health (SDOH) Interventions Food Insecurity Interventions: Other (Comment) (patient states she goes to food banks when needed)  Readmission Risk Interventions     No data to display

## 2021-11-25 NOTE — Progress Notes (Signed)
Hope Budds to be D/C'd Home per MD order.  Discussed prescriptions and follow up appointments with the patient. Prescription given to patient, other prescriptions electronically submitted.  medication list explained in detail. Pt verbalized understanding. Radial site care given to patient. Dressing to right radial clean,dry and intact.   Allergies as of 11/25/2021       Reactions   Penicillins Anaphylaxis   Latex Swelling   Nsaids         Medication List     STOP taking these medications    cariprazine 1.5 MG capsule Commonly known as: VRAYLAR   fluticasone 50 MCG/ACT nasal spray Commonly known as: Flonase   gabapentin 100 MG capsule Commonly known as: NEURONTIN   hydrOXYzine 25 MG tablet Commonly known as: ATARAX   prazosin 1 MG capsule Commonly known as: MINIPRESS       TAKE these medications    albuterol 108 (90 Base) MCG/ACT inhaler Commonly known as: VENTOLIN HFA Inhale 2 puffs into the lungs every 4 (four) hours as needed for wheezing or shortness of breath.   calcium-vitamin D 500-5 MG-MCG tablet Commonly known as: OSCAL WITH D Take 2 tablets by mouth daily with breakfast.   colchicine 0.6 MG tablet Take 1 tablet (0.6 mg total) by mouth 2 (two) times daily for 7 days.   dextromethorphan-guaiFENesin 30-600 MG 12hr tablet Commonly known as: MUCINEX DM Take 1 tablet by mouth 2 (two) times daily as needed for cough.   folic acid 1 MG tablet Commonly known as: FOLVITE Take 1 tablet (1 mg total) by mouth daily.   multivitamin with minerals Tabs tablet Take 1 tablet by mouth daily.   nicotine 21 mg/24hr patch Commonly known as: NICODERM CQ - dosed in mg/24 hours Place 1 patch (21 mg total) onto the skin daily.   pantoprazole 40 MG tablet Commonly known as: PROTONIX Take 1 tablet (40 mg total) by mouth daily.   thiamine 100 MG tablet Commonly known as: Vitamin B-1 Take 1 tablet (100 mg total) by mouth daily.        Vitals:   11/25/21 0830  11/25/21 1232  BP: 114/76 111/77  Pulse: 83 86  Resp: 16 16  Temp: 98.4 F (36.9 C) 98.5 F (36.9 C)  SpO2: 100% 97%    Skin clean, dry and intact without evidence of skin break down, no evidence of skin tears noted. IV catheter discontinued intact. Site without signs and symptoms of complications. Dressing and pressure applied. Pt denies pain at this time. No complaints noted.  An After Visit Summary was printed and given to the patient. Patient waiting for ride to be discharged home  [place]

## 2021-11-25 NOTE — Discharge Summary (Addendum)
Physician Discharge Summary   Patient: Rebecca Duffy MRN: [id number] DOB: September 22, 1980  Admit date:     11/24/2021  Discharge date: 11/25/21  Discharge Physician: Lorella Nimrod   PCP: Patient, No Pcp Per   Recommendations at discharge:  Please obtain CBC and BMP in 1 week Follow-up with cardiology in 1 to 2 weeks Follow-up with primary care provider  Discharge Diagnoses: Active Problems:   NSTEMI (non-ST elevated myocardial infarction) ([place])   Gastric ulcer   Gastroenteritis   Tobacco abuse   Alcohol use   Bipolar I disorder, most recent episode depressed [facility])   Hospital Course: Taken from H&P.   Rebecca Duffy is a 41 y.o. female with medical history significant of bipolar disorder, PTSD, OCD, ADHD, alcohol use, bleeding gastric ulcer, tobacco abuse, cannabinoid use disorder, who presents with chest pain and shortness breath.    Patient states that he has viral-like symptoms GI symptoms since Friday, described as multiple episodes of nausea, nonbilious nonbloody vomiting, diarrhea, mild abdominal pain, fever and chills.  She states that the symptoms have been improving. She developed shortness of breath and chest pain since yesterday.  Her chest pain is located substernal area, moderate to severe, dull, also pleuritic, aggravated by deep breath, radiated to the upper back.  Patient has cough with little mucus production.  Her fever and chills has resolved.  She has had 1-2 times of watery diarrhea since yesterday which has significantly improved.  Currently no vomiting.   Data reviewed independently and ED Course: pt was found to have troponin 233, BNP 239 GFR> 60, blood pressure 90/63, heart rate 92, RR 22, oxygen saturation 98% on room air, temperature normal.  Chest x-ray negative.  CTA negative for PE.  EKG: I have personally reviewed.  Sinus rhythm, QTc 430, poor R wave question, no ischemic change.  Cardiology was consulted.  11/7: Troponin peaked at 233 and started trending  down.  Echocardiogram was within normal limit.  Due to persistent chest pain she was taken for cardiac catheterization which shows normal coronary arteries, mildly elevated left ventricular end-diastolic pressure. Per cardiology her presentation likely due to mild myocarditis and no myocardial infarction or NSTEMI. No need for statin, beta-blocker or cardiac rehab. Patient was started on colchicine for 1 week. Lipid panel within normal limits with HDL of 33 and LDL of 76, A1c of 4.8.  Patient continued to have some pleuritic chest pain.  She was given colchicine to be used twice daily for 1 week and after that as needed and need to have a follow-up with her cardiologist for further recommendations. Patient does not have NSTEMI, chest pain most likely secondary to myopericarditis.  Patient was also counseled against smoking and alcohol use.  She will continue on current medications and follow-up with her providers.   Consultants: Cardiology Procedures performed: Cardiac catheterization Disposition: Home Diet recommendation:  Discharge Diet Orders (From admission, onward)     Start     Ordered   11/25/21 0000  Diet - low sodium heart healthy        11/25/21 1109           Cardiac diet DISCHARGE MEDICATION: Allergies as of 11/25/2021       Reactions   Penicillins Anaphylaxis   Latex Swelling   Nsaids         Medication List     STOP taking these medications    cariprazine 1.5 MG capsule Commonly known as: VRAYLAR   fluticasone 50 MCG/ACT nasal spray Commonly known  as: Flonase   gabapentin 100 MG capsule Commonly known as: NEURONTIN   hydrOXYzine 25 MG tablet Commonly known as: ATARAX   prazosin 1 MG capsule Commonly known as: MINIPRESS       TAKE these medications    albuterol 108 (90 Base) MCG/ACT inhaler Commonly known as: VENTOLIN HFA Inhale 2 puffs into the lungs every 4 (four) hours as needed for wheezing or shortness of breath.   calcium-vitamin  D 500-5 MG-MCG tablet Commonly known as: OSCAL WITH D Take 2 tablets by mouth daily with breakfast.   colchicine 0.6 MG tablet Take 1 tablet (0.6 mg total) by mouth 2 (two) times daily for 7 days.   dextromethorphan-guaiFENesin 30-600 MG 12hr tablet Commonly known as: MUCINEX DM Take 1 tablet by mouth 2 (two) times daily as needed for cough.   folic acid 1 MG tablet Commonly known as: FOLVITE Take 1 tablet (1 mg total) by mouth daily.   multivitamin with minerals Tabs tablet Take 1 tablet by mouth daily.   nicotine 21 mg/24hr patch Commonly known as: NICODERM CQ - dosed in mg/24 hours Place 1 patch (21 mg total) onto the skin daily.   pantoprazole 40 MG tablet Commonly known as: PROTONIX Take 1 tablet (40 mg total) by mouth daily.   thiamine 100 MG tablet Commonly known as: Vitamin B-1 Take 1 tablet (100 mg total) by mouth daily.        Follow-up Information     Iran Ouch, MD Follow up.   Specialty: Cardiology Contact information: [address] [place] [address] [phone]                Discharge Exam: Ceasar Mons Weights   11/24/21 0850 11/24/21 1547  Weight: 86.2 kg 86.2 kg   General.     In no acute distress. Pulmonary.  Lungs clear bilaterally, normal respiratory effort. CV.  Regular rate and rhythm, no JVD, rub or murmur. Abdomen.  Soft, nontender, nondistended, BS positive. CNS.  Alert and oriented .  No focal neurologic deficit. Extremities.  No edema, no cyanosis, pulses intact and symmetrical. Psychiatry.  Judgment and insight appears normal.   Condition at discharge: stable  The results of significant diagnostics from this hospitalization (including imaging, microbiology, ancillary and laboratory) are listed below for reference.   Imaging Studies: CARDIAC CATHETERIZATION  Result Date: 11/24/2021 1.  Normal coronary arteries. 2.  Left ventricular angiography was not performed EF was normal by echo.  Mildly  elevated left ventricular end-diastolic pressure. Recommendations: Suspect that the patient's presentation is likely due to mild myocarditis.  Based on cardiac catheterization, this was not a myocardial infarction.  No need for a statin beta-blocker or cardiac rehab. I added colchicine to be used for 1 week. Suspect that the patient can likely be discharged home tomorrow if no other issues.   ECHOCARDIOGRAM COMPLETE  Result Date: 11/24/2021    ECHOCARDIOGRAM REPORT   Patient Name:   CARISMA TROUPE Date of Exam: 11/24/2021 Medical Rec #:  [id number]     Height:       70.0 in Accession #:    [id number]    Weight:       190.0 lb Date of Birth:  July 25, 1980     BSA:          2.042 m Patient Age:    41 years      BP:           90/63 mmHg Patient Gender: F  HR:           88 bpm. Exam Location:  [facility] Procedure: 2D Echo, Color Doppler, Cardiac Doppler and Intracardiac            Opacification Agent Indications:     R07.9 Chest Pain  History:         Patient has no prior history of Echocardiogram examinations.                  Signs/Symptoms:Shortness of Breath, Chest Pain and Upper back                  pain.  Sonographer:     Humphrey Rolls Referring Phys:  [id number] Brien Few NIU Diagnosing Phys: Lorine Bears MD  Sonographer Comments: Technically difficult study due to poor echo windows. Image acquisition challenging due to patient body habitus and Image acquisition challenging due to breast implants. IMPRESSIONS  1. Left ventricular ejection fraction, by estimation, is 60 to 65%. The left ventricle has normal function. The left ventricle has no regional wall motion abnormalities. Left ventricular diastolic parameters were normal.  2. Right ventricular systolic function is normal. The right ventricular size is normal. Tricuspid regurgitation signal is inadequate for assessing PA pressure.  3. The mitral valve is normal in structure. No evidence of mitral valve regurgitation. No evidence of mitral stenosis.  4. The aortic  valve is normal in structure. Aortic valve regurgitation is not visualized. No aortic stenosis is present.  5. The inferior vena cava is normal in size with greater than 50% respiratory variability, suggesting right atrial pressure of 3 mmHg. FINDINGS  Left Ventricle: Left ventricular ejection fraction, by estimation, is 60 to 65%. The left ventricle has normal function. The left ventricle has no regional wall motion abnormalities. Definity contrast agent was given IV to delineate the left ventricular  endocardial borders. The left ventricular internal cavity size was normal in size. There is no left ventricular hypertrophy. Left ventricular diastolic parameters were normal. Right Ventricle: The right ventricular size is normal. No increase in right ventricular wall thickness. Right ventricular systolic function is normal. Tricuspid regurgitation signal is inadequate for assessing PA pressure. Left Atrium: Left atrial size was normal in size. Right Atrium: Right atrial size was normal in size. Pericardium: There is no evidence of pericardial effusion. Mitral Valve: The mitral valve is normal in structure. No evidence of mitral valve regurgitation. No evidence of mitral valve stenosis. Tricuspid Valve: The tricuspid valve is normal in structure. Tricuspid valve regurgitation is not demonstrated. No evidence of tricuspid stenosis. Aortic Valve: The aortic valve is normal in structure. Aortic valve regurgitation is not visualized. No aortic stenosis is present. Aortic valve mean gradient measures 4.0 mmHg. Aortic valve peak gradient measures 8.5 mmHg. Aortic valve area, by VTI measures 2.08 cm. Pulmonic Valve: The pulmonic valve was normal in structure. Pulmonic valve regurgitation is trivial. No evidence of pulmonic stenosis. Aorta: The aortic root is normal in size and structure. Venous: The inferior vena cava is normal in size with greater than 50% respiratory variability, suggesting right atrial pressure of 3  mmHg. IAS/Shunts: No atrial level shunt detected by color flow Doppler.  LEFT VENTRICLE PLAX 2D LVIDd:         4.70 cm   Diastology LVIDs:         3.10 cm   LV e' medial:    10.30 cm/s LV PW:         1.00 cm   LV E/e' medial:  9.9  LV IVS:        0.90 cm   LV e' lateral:   13.60 cm/s LVOT diam:     1.70 cm   LV E/e' lateral: 7.5 LV SV:         57 LV SV Index:   28 LVOT Area:     2.27 cm  RIGHT VENTRICLE RV Basal diam:  2.70 cm RV S prime:     18.20 cm/s LEFT ATRIUM             Index        RIGHT ATRIUM           Index LA diam:        3.10 cm 1.52 cm/m   RA Area:     19.70 cm LA Vol (A2C):   33.3 ml 16.31 ml/m  RA Volume:   53.80 ml  26.34 ml/m LA Vol (A4C):   35.1 ml 17.19 ml/m LA Biplane Vol: 34.6 ml 16.94 ml/m  AORTIC VALVE                     PULMONIC VALVE AV Area (Vmax):    2.11 cm      PV Vmax:       0.98 m/s AV Area (Vmean):   2.29 cm      PV Vmean:      67.900 cm/s AV Area (VTI):     2.08 cm      PV VTI:        0.187 m AV Vmax:           146.00 cm/s   PV Peak grad:  3.8 mmHg AV Vmean:          100.000 cm/s  PV Mean grad:  2.0 mmHg AV VTI:            0.272 m AV Peak Grad:      8.5 mmHg AV Mean Grad:      4.0 mmHg LVOT Vmax:         136.00 cm/s LVOT Vmean:        101.000 cm/s LVOT VTI:          0.249 m LVOT/AV VTI ratio: 0.92  AORTA Ao Root diam: 3.10 cm MITRAL VALVE MV Area (PHT): 4.63 cm     SHUNTS MV Decel Time: 164 msec     Systemic VTI:  0.25 m MV E velocity: 102.00 cm/s  Systemic Diam: 1.70 cm MV A velocity: 70.70 cm/s MV E/A ratio:  1.44 Lorine Bears MD Electronically signed by Lorine Bears MD Signature Date/Time: 11/24/2021/3:54:57 PM    Final    CT Angio Chest PE W and/or Wo Contrast  Result Date: 11/24/2021 CLINICAL DATA:  Shortness of breath, pulmonary embolism suspected EXAM: CT ANGIOGRAPHY CHEST WITH CONTRAST TECHNIQUE: Multidetector CT imaging of the chest was performed using the standard protocol during bolus administration of intravenous contrast. Multiplanar CT image  reconstructions and MIPs were obtained to evaluate the vascular anatomy. RADIATION DOSE REDUCTION: This exam was performed according to the departmental dose-optimization program which includes automated exposure control, adjustment of the mA and/or kV according to patient size and/or use of iterative reconstruction technique. CONTRAST:  OMNIPAQUE IOHEXOL 350 MG/ML SOLN COMPARISON:  None Available. FINDINGS: Cardiovascular: Satisfactory opacification of the pulmonary arteries to the segmental level. No evidence of pulmonary embolism. Normal heart size. No pericardial effusion. Variant arch anatomy. The right brachiocephalic and left common carotid artery share a common origin while the  left vertebral artery arises directly from the aorta. Mediastinum/Nodes: No enlarged mediastinal, hilar, or axillary lymph nodes. Thyroid gland, trachea, and esophagus demonstrate no significant findings. Lungs/Pleura: Lungs are clear. No pleural effusion or pneumothorax. Upper Abdomen: No acute abnormality. Musculoskeletal: No chest wall abnormality. No acute or significant osseous findings. Bilateral breast augmentation prostheses. Metallic adornments are also present. Review of the MIP images confirms the above findings. IMPRESSION: 1. Negative for acute pulmonary embolus, pneumonia or other acute cardiopulmonary process. Electronically Signed   By: Malachy MoanHeath  McCullough M.D.   On: 11/24/2021 13:14   DG Chest 2 View  Result Date: 11/24/2021 CLINICAL DATA:  Chest pain and short of breath EXAM: CHEST - 2 VIEW COMPARISON:  None Available. FINDINGS: Normal mediastinum and cardiac silhouette. Normal pulmonary vasculature. No evidence of effusion, infiltrate, or pneumothorax. No acute bony abnormality. IMPRESSION: No acute cardiopulmonary process. Electronically Signed   By: Genevive BiStewart  Edmunds M.D.   On: 11/24/2021 09:32    Microbiology: Results for orders placed or performed during the hospital encounter of 02/07/20  Covid-19,  Flu A+B ([facility])     Status: Abnormal   Collection Time: 02/07/20  9:04 AM   Specimen: Nasopharyngeal   Naso  Result Value Ref Range Status   SARS-CoV-2, NAA Detected (A) Not Detected Final    Comment: Patients who have a positive COVID-19 test result may now have treatment options. Treatment options are available for patients with mild to moderate symptoms and for hospitalized patients. Visit our website at [email or web address] for resources and information. This nucleic acid amplification test was developed and its performance characteristics determined by [facility]. Nucleic acid amplification tests include RT-PCR and TMA. This test has not been FDA cleared or approved. This test has been authorized by FDA under an Emergency Use Authorization (EUA). This test is only authorized for the duration of time the declaration that circumstances exist justifying the authorization of the emergency use of in vitro diagnostic tests for detection of SARS-CoV-2 virus and/or diagnosis of COVID-19 infection under section 564(b)(1) of the Act, 21 U.S.C. 409WJX-9(J360bbb-3(b) (1), unless the authorization is terminated or revoked sooner. When diagnostic testing is negativ e, the possibility of a false negative result should be considered in the context of a patient's recent exposures and the presence of clinical signs and symptoms consistent with COVID-19. An individual without symptoms of COVID-19 and who is not shedding SARS-CoV-2 virus would expect to have a negative (not detected) result in this assay.    Influenza A, NAA Not Detected Not Detected Final   Influenza B, NAA Not Detected Not Detected Final    Labs: CBC: Recent Labs  Lab 11/24/21 0854 11/25/21 0135  WBC 11.2* 7.6  HGB 11.9* 11.0*  HCT 35.1* 33.1*  MCV 89.1 89.5  PLT 313 289   Basic Metabolic Panel: Recent Labs  Lab 11/24/21 0854 11/25/21 0135  NA 139 139  K 3.6 3.3*  CL 111 111  CO2 22 23  GLUCOSE  101* 101*  BUN 10 10  CREATININE 0.55 0.63  CALCIUM 8.5* 8.3*   Liver Function Tests: No results for input(s): "AST", "ALT", "ALKPHOS", "BILITOT", "PROT", "ALBUMIN" in the last 168 hours. CBG: Recent Labs  Lab 11/25/21 0936  GLUCAP 106*    Discharge time spent: greater than 30 minutes.  This record has been created using Conservation officer, historic buildingsDragon voice recognition software. Errors have been sought and corrected,but may not always be located. Such creation errors do not reflect on the standard of care.   Signed: Arnetha CourserSumayya Ashford Clouse,  MD [facility] 11/25/2021

## 2021-11-30 NOTE — Progress Notes (Incomplete)
Cardiology Office Note:    Date:  11/30/2021   ID:  Rebecca Duffy, DOB 10/16/1980, MRN [id number]  PCP:  Patient, No Pcp Per   [facility] Providers Cardiologist:  None { Click to update primary MD,subspecialty MD or APP then REFRESH:1}    Referring MD: No ref. provider found   No chief complaint on file. ***  History of Present Illness:    Rebecca Duffy is a 41 y.o. female with a hx of ***  NSTEMI PTSD OCD ADHS Bipolar 1 disorder Substance abuse Hx of bleeding gastric ulcer  Presented to the ED on November 24, 2021 with chief complaint of chest pain or shortness of breath.  Patient reported viral-like prior symptoms, multiple nausea episodes, nonbilious nonbloody vomiting, mild abdominal pain, fever, diarrhea, and chills.  Symptoms were improving, she developed shortness of breath and chest pain the day before.  Chest pain was described as substernal, dull, moderate to severe in intensity, and noted also as pleuritic and aggravated by deep breath, radiates to upper back.  Noted cough with little mucus production.  Patient was found to have troponin to 33, BP 90/63, heart rate 92, respiratory rate 22, satting on 98% on room air, normal temperature.  CXR negative.  CTA negative for PE.  Cardiology consulted.  EKG revealed sinus rhythm, no ischemic change.  Social history is positive for tobacco use, alcohol use-reported drinking 6 6 standard drinks of alcohol per week, and marijuana use.  Echocardiogram WNL, cardiac cath showed normal coronary arteries, mildly elevated LVEDP.  Per cardiology, her presentation likely was due to mild myocarditis and no MI or NSTEMI.  Therefore, there was no need for statin, beta-blocker or cardiac rehab.  She was started on colchicine for 1 week and lipid panel was WNL.  Was told to follow-up with outpatient cardiology post discharge.  Today she presents for hospital follow-up.  She states.  NSTEMI Substance abuse Leukocytosis  Past Medical  History:  Diagnosis Date   ADHD    Bipolar 1 disorder (HCC)    Gastric ulcer    OCD (obsessive compulsive disorder)    PTSD (post-traumatic stress disorder)     Past Surgical History:  Procedure Laterality Date   BREAST ENHANCEMENT SURGERY     CESAREAN SECTION     LEFT HEART CATH AND CORONARY ANGIOGRAPHY N/A 11/24/2021   Procedure: LEFT HEART CATH AND CORONARY ANGIOGRAPHY;  Surgeon: Iran Ouch, MD;  Location: [facility] INVASIVE CV LAB;  Service: Cardiovascular;  Laterality: N/A;   TUBAL LIGATION      Current Medications: No outpatient medications have been marked as taking for the 12/01/21 encounter (Appointment) with Sondra Barges, PA-C.     Allergies:   Penicillins, Latex, and Nsaids   Social History   Socioeconomic History   Marital status: Divorced    Spouse name: Not on file   Number of children: Not on file   Years of education: Not on file   Highest education level: Not on file  Occupational History   Not on file  Tobacco Use   Smoking status: Some Days    Types: Cigarettes   Smokeless tobacco: Never  Vaping Use   Vaping Use: Every day  Substance and Sexual Activity   Alcohol use: Yes    Alcohol/week: 6.0 standard drinks of alcohol    Types: 6 Cans of beer per week   Drug use: Yes    Frequency: 2.0 times per week    Types: Marijuana   Sexual activity:  Not on file  Other Topics Concern   Not on file  Social History Narrative   Not on file   Social Determinants of Health   Financial Resource Strain: Not on file  Food Insecurity: Food Insecurity Present (11/24/2021)   Hunger Vital Sign    Worried About Running Out of Food in the Last Year: Sometimes true    Ran Out of Food in the Last Year: Sometimes true  Transportation Needs: No Transportation Needs (11/24/2021)   PRAPARE - Administrator, Civil Service (Medical): No    Lack of Transportation (Non-Medical): No  Physical Activity: Not on file  Stress: Not on file  Social Connections: Not  on file     Family History: The patient's ***family history includes Healthy in her mother; Other in her father.  ROS:   Please see the history of present illness.    *** All other systems reviewed and are negative.  EKGs/Labs/Other Studies Reviewed:    The following studies were reviewed today: ***  EKG:  EKG is *** ordered today.  The ekg ordered today demonstrates ***  Recent Labs: 11/24/2021: B Natriuretic Peptide 239.9 11/25/2021: BUN 10; Creatinine, Ser 0.63; Hemoglobin 11.0; Platelets 289; Potassium 3.3; Sodium 139  Recent Lipid Panel    Component Value Date/Time   CHOL 131 11/25/2021 0135   TRIG 110 11/25/2021 0135   HDL 33 (L) 11/25/2021 0135   CHOLHDL 4.0 11/25/2021 0135   VLDL 22 11/25/2021 0135   LDLCALC 76 11/25/2021 0135     Risk Assessment/Calculations:   {Does this patient have ATRIAL FIBRILLATION?:[phone]}  No BP recorded.  {Refresh Note OR Click here to enter BP  :1}***         Physical Exam:    VS:  LMP 11/24/2021 Comment: tubal ligation    Wt Readings from Last 3 Encounters:  11/24/21 190 lb (86.2 kg)  06/26/20 187 lb (84.8 kg)  03/07/19 195 lb 15.8 oz (88.9 kg)     GEN: *** Well nourished, well developed in no acute distress HEENT: Normal NECK: No JVD; No carotid bruits LYMPHATICS: No lymphadenopathy CARDIAC: ***RRR, no murmurs, rubs, gallops RESPIRATORY:  Clear to auscultation without rales, wheezing or rhonchi  ABDOMEN: Soft, non-tender, non-distended MUSCULOSKELETAL:  No edema; No deformity  SKIN: Warm and dry NEUROLOGIC:  Alert and oriented x 3 PSYCHIATRIC:  Normal affect   ASSESSMENT:    No diagnosis found. PLAN:    In order of problems listed above:  ***      {Are you ordering a CV Procedure (e.g. stress test, cath, DCCV, TEE, etc)?   Press F2        :[id number]}    Medication Adjustments/Labs and Tests Ordered: Current medicines are reviewed at length with the patient today.  Concerns regarding medicines are  outlined above.  No orders of the defined types were placed in this encounter.  No orders of the defined types were placed in this encounter.   There are no Patient Instructions on file for this visit.   SignedSharlene Dory, NP  11/30/2021 8:34 PM    [facility]

## 2021-12-01 ENCOUNTER — Telehealth: Payer: Self-pay | Admitting: Physician Assistant

## 2021-12-01 ENCOUNTER — Telehealth: Payer: Self-pay

## 2021-12-01 ENCOUNTER — Ambulatory Visit: Payer: Medicaid Other | Admitting: Physician Assistant

## 2021-12-01 NOTE — Telephone Encounter (Signed)
Received secure chat from answering service: patient called and said that she has been having pain where she has the cardioversion done. it hurts right above her breast. she wants to know if that is normal  Advised to let pt know that I will be calling from a blocked #. Attempted to contact pt, but all would not go through, got message that "person you are trying to call is unavailable".

## 2021-12-01 NOTE — Telephone Encounter (Signed)
Pt was seen in the hospital by Eula Listen, PA and pt is requesting  a note to saying she was in the hospital sent to her email    Email: [email or web address]

## 2021-12-01 NOTE — Telephone Encounter (Signed)
Patient is returning call.  °

## 2021-12-01 NOTE — Telephone Encounter (Signed)
Sent via MyChart

## 2021-12-02 NOTE — Telephone Encounter (Signed)
Attempted to call the patient today to follow up. No answer- I left a message to please call back.   Reviewed the patient's chart.  She was admitted from 11/6-11/7 for possible NSTEMI.  She had a Cardiac Cath done on 11/24/21 with Dr. Kirke Corin.  Per Cath report (11/24/21): Conclusion  1.  Normal coronary arteries. 2.  Left ventricular angiography was not performed EF was normal by echo.  Mildly elevated left ventricular end-diastolic pressure.   Recommendations: Suspect that the patient's presentation is likely due to mild myocarditis.  Based on cardiac catheterization, this was not a myocardial infarction.  No need for a statin beta-blocker or cardiac rehab. I added colchicine to be used for 1 week. Suspect that the patient can likely be discharged home tomorrow if no other issues.    Eula Listen, PA rounded on the patient on 11/25/21. Per Ryan's note from 11/25/21: 1. Elevated troponin secondary to myopericarditis: -LHC 11/6 with normal coronary arteries  -Initial and peak high-sensitivity troponin 233, EKG notable for possible pericarditis -Colchicine 0.6 mg bid for one week -Can stop ASA   -Echo as above -Check A1c and lipid panel for further risk stratification -UDS remains pending -Not a NSTEMI, no need for statin, beta blocker, or cardiac rehab -Ambulate    2.  Leukocytosis: -Possibly inflammatory in the setting of the above -Trend    3.  Alcohol/tobacco/THC use: -Complete cessation encouraged

## 2021-12-04 NOTE — Telephone Encounter (Signed)
I called and spoke with the patient. I advised was calling to follow up on her call from earlier in the week and the discomfort she was having. Per the patient, her discomfort has completely resolved on it's own and she is currently having no issues.   No further advise needed at this time.   She was appreciative of the call back.

## 2021-12-17 ENCOUNTER — Emergency Department: Payer: No Typology Code available for payment source

## 2021-12-17 ENCOUNTER — Other Ambulatory Visit: Payer: Self-pay

## 2021-12-17 ENCOUNTER — Emergency Department
Admission: EM | Admit: 2021-12-17 | Discharge: 2021-12-17 | Disposition: A | Discharge status: Home / Self Care | Payer: No Typology Code available for payment source | Attending: Emergency Medicine | Admitting: Emergency Medicine

## 2021-12-17 DIAGNOSIS — J209 Acute bronchitis, unspecified: Secondary | ICD-10-CM | POA: Diagnosis not present

## 2021-12-17 DIAGNOSIS — Z20822 Contact with and (suspected) exposure to covid-19: Secondary | ICD-10-CM | POA: Diagnosis not present

## 2021-12-17 DIAGNOSIS — N9489 Other specified conditions associated with female genital organs and menstrual cycle: Secondary | ICD-10-CM | POA: Diagnosis not present

## 2021-12-17 DIAGNOSIS — R059 Cough, unspecified: Secondary | ICD-10-CM | POA: Diagnosis present

## 2021-12-17 LAB — CBC
HCT: 38.7 % (ref 36.0–46.0)
Hemoglobin: 12.9 g/dL (ref 12.0–15.0)
MCH: 29.7 pg (ref 26.0–34.0)
MCHC: 33.3 g/dL (ref 30.0–36.0)
MCV: 89.2 fL (ref 80.0–100.0)
Platelets: 303 10*3/uL (ref 150–400)
RBC: 4.34 MIL/uL (ref 3.87–5.11)
RDW: 13 % (ref 11.5–15.5)
WBC: 8.4 10*3/uL (ref 4.0–10.5)
nRBC: 0 % (ref 0.0–0.2)

## 2021-12-17 LAB — BASIC METABOLIC PANEL
Anion gap: 8 (ref 5–15)
BUN: 5 mg/dL — ABNORMAL LOW (ref 6–20)
CO2: 21 mmol/L — ABNORMAL LOW (ref 22–32)
Calcium: 8.8 mg/dL — ABNORMAL LOW (ref 8.9–10.3)
Chloride: 109 mmol/L (ref 98–111)
Creatinine, Ser: 0.59 mg/dL (ref 0.44–1.00)
GFR, Estimated: >60 mL/min (ref >60)
Glucose, Bld: 122 mg/dL — ABNORMAL HIGH (ref 70–99)
Potassium: 3.3 mmol/L — ABNORMAL LOW (ref 3.5–5.1)
Sodium: 138 mmol/L (ref 135–145)

## 2021-12-17 LAB — D-DIMER, QUANTITATIVE: D-Dimer, Quant: 0.5 ug/mL-FEU (ref 0.00–0.50)

## 2021-12-17 LAB — TROPONIN I (HIGH SENSITIVITY): Troponin I (High Sensitivity): <2 ng/L (ref <18)

## 2021-12-17 LAB — HCG, QUANTITATIVE, PREGNANCY: hCG, Beta Chain, Quant, S: <1 m[IU]/mL (ref <5)

## 2021-12-17 LAB — RESP PANEL BY RT-PCR (FLU A&B, COVID) ARPGX2
Influenza A by PCR: NEGATIVE
Influenza B by PCR: NEGATIVE
SARS Coronavirus 2 by RT PCR: NEGATIVE

## 2021-12-17 MED ORDER — SODIUM CHLORIDE 0.9 % IV BOLUS: 500.0000 mL | Freq: Once | INTRAVENOUS | Status: DC

## 2021-12-17 MED ORDER — ACETAMINOPHEN 325 MG PO TABS: 650.0000 mg | ORAL_TABLET | Freq: Once | ORAL | Status: DC

## 2021-12-17 MED ORDER — PREDNISONE 20 MG PO TABS
60.0000 mg | ORAL_TABLET | Freq: Once | ORAL | Status: AC
  Administered 2021-12-17: 60 mg via ORAL
  Filled 2021-12-17: qty 3

## 2021-12-17 MED ORDER — TRAMADOL HCL 50 MG PO TABS: 50.0000 mg | ORAL_TABLET | Freq: Four times a day (QID) | ORAL | 0 refills | Status: AC | PRN

## 2021-12-17 MED ORDER — IPRATROPIUM-ALBUTEROL 0.5-2.5 (3) MG/3ML IN SOLN
3.0000 mL | Freq: Once | RESPIRATORY_TRACT | Status: AC
  Administered 2021-12-17: 3 mL via RESPIRATORY_TRACT
  Filled 2021-12-17: qty 3

## 2021-12-17 MED ORDER — ALBUTEROL SULFATE HFA 108 (90 BASE) MCG/ACT IN AERS: 2.0000 | INHALATION_SPRAY | RESPIRATORY_TRACT | 0 refills | Status: DC | PRN

## 2021-12-17 MED ORDER — TRAMADOL HCL 50 MG PO TABS
50.0000 mg | ORAL_TABLET | Freq: Once | ORAL | Status: AC
  Administered 2021-12-17: 50 mg via ORAL
  Filled 2021-12-17: qty 1

## 2021-12-17 MED ORDER — PREDNISONE 20 MG PO TABS: 60.0000 mg | ORAL_TABLET | Freq: Every day | ORAL | 0 refills | Status: AC

## 2021-12-17 NOTE — ED Notes (Signed)
Full rainbow sent to lab.  

## 2021-12-17 NOTE — Discharge Instructions (Addendum)
Take the prednisone as prescribed and finish the full 4-day course.  Use the albuterol inhaler as needed.  You can take Tylenol for pain but use the tramadol if needed for more severe pain that is not relieved by the Tylenol.  Return to the ER for new, worsening, or persistent severe shortness of breath, chest pain, fever, weakness or any other new or worsening symptoms that concern you.

## 2021-12-17 NOTE — ED Provider Notes (Signed)
[facility] Provider Note    Event Date/Time   First MD Initiated Contact with Patient 12/17/21 1000     (approximate)   History   Cough and Chest Pain   HPI  Rebecca Duffy is a 41 y.o. female with a history of NSTEMI/myocarditis, bipolar disorder, PTSD, OCD, ADHD, alcohol use, PUD, and cannabinoid use disorder who presents with cough for the last 3 to 4 days, persistent course, associated with some shortness of breath, wheezing, and with sharp left lower chest pain.  The patient reports intermittent fevers as well as feeling lightheaded and weak.  She also reports left ear pain and pressure and decreased hearing over the last 1 to 2 days.  She denies any discharge from the ear.  Reviewed the past medical records.  The patient was admitted earlier this month with chest pain.  She had an elevated troponin and subsequently had a cardiac catheterization which was negative for any significant CAD.  She was thought to have likely myocarditis.   Physical Exam   Triage Vital Signs: ED Triage Vitals [12/17/21 0826]  Enc Vitals Group     BP 111/89     Pulse Rate 99     Resp 19     Temp 98.3 F (36.8 C)     Temp src      SpO2 93 %     Weight      Height      Head Circumference      Peak Flow      Pain Score      Pain Loc      Pain Edu?      Excl. in [place]?     Most recent vital signs: Vitals:   12/17/21 0826  BP: 111/89  Pulse: 99  Resp: 19  Temp: 98.3 F (36.8 C)  SpO2: 93%    General: Awake, no distress.  CV:  Good peripheral perfusion.  Normal heart sounds. Resp:  Normal effort.  Bilateral wheezing.  Good air entry. Abd:  No distention.  Other:  No peripheral edema.  Oropharynx clear.  Left TM slightly erythematous but otherwise normal-appearing.   ED Results / Procedures / Treatments   Labs (all labs ordered are listed, but only abnormal results are displayed) Labs Reviewed  BASIC METABOLIC PANEL - Abnormal; Notable for the following  components:      Result Value   Potassium 3.3 (*)    CO2 21 (*)    Glucose, Bld 122 (*)    BUN 5 (*)    Calcium 8.8 (*)    All other components within normal limits  RESP PANEL BY RT-PCR (FLU A&B, COVID) ARPGX2  CBC  D-DIMER, QUANTITATIVE  HCG, QUANTITATIVE, PREGNANCY  POC URINE PREG, ED  TROPONIN I (HIGH SENSITIVITY)     EKG  ED ECG REPORT I, Dionne Bucy, the attending physician, personally viewed and interpreted this ECG.  Date: 12/17/2021 EKG Time: 0828 Rate: 92 Rhythm: normal sinus rhythm QRS Axis: normal Intervals: normal ST/T Wave abnormalities: normal Narrative Interpretation: no evidence of acute ischemia    RADIOLOGY  Chest x-ray: I independently viewed and interpreted the images; there is no focal consolidation or edema   PROCEDURES:  Critical Care performed: No  Procedures   MEDICATIONS ORDERED IN ED: Medications  ipratropium-albuterol (DUONEB) 0.5-2.5 (3) MG/3ML nebulizer solution 3 mL (3 mLs Nebulization Given 12/17/21 1047)  predniSONE (DELTASONE) tablet 60 mg (60 mg Oral Given 12/17/21 1043)  traMADol (ULTRAM) tablet 50 mg (  50 mg Oral Given 12/17/21 1044)     IMPRESSION / MDM / ASSESSMENT AND PLAN / ED COURSE  I reviewed the triage vital signs and the nursing notes.  41 year old female with PMH as noted above presents with cough for the last several days associated with shortness of breath and wheezing as well as sharp left-sided chest pain which is reproducible with movement.  Physical exam reveals bilateral wheezing.  Differential diagnosis includes, but is not limited to, COVID-19, flu, RSV, other viral syndrome, acute bronchitis, pneumonia, less likely cardiac etiology.  I have a low suspicion for PE.  Initial troponin is negative.  There is no leukocytosis.  BMP is normal.  Chest x-ray shows no acute findings.  We will obtain a D-dimer, respiratory panel, give bronchodilators and steroid and reassess.  Patient's presentation is  most consistent with acute presentation with potential threat to life or bodily function.  ----------------------------------------- 12:42 PM on 12/17/2021 -----------------------------------------  Respiratory panel is negative.  D-dimer is also negative.  There is no indication for a repeat troponin given that the initial is negative and the patient had a negative cardiac catheterization several weeks ago.  Her pain is quite lateral, sharp, and atypical, and much more consistent with musculoskeletal chest wall pain related to her coughing.  The patient reports improved symptoms after the medications.  She is stable for discharge home at this time.  I counseled her on the results of the work-up.  I gave her strict return precautions and she expressed understanding.   FINAL CLINICAL IMPRESSION(S) / ED DIAGNOSES   Final diagnoses:  Acute bronchitis, unspecified organism     Rx / DC Orders   ED Discharge Orders          Ordered    predniSONE (DELTASONE) 20 MG tablet  Daily with breakfast        12/17/21 1240    traMADol (ULTRAM) 50 MG tablet  Every 6 hours PRN        12/17/21 1240    albuterol (VENTOLIN HFA) 108 (90 Base) MCG/ACT inhaler  Every 4 hours PRN        12/17/21 1240             Note:  This document was prepared using Dragon voice recognition software and may include unintentional dictation errors.    Dionne Bucy, MD 12/17/21 1537

## 2021-12-17 NOTE — ED Triage Notes (Addendum)
Pt comes with c/o chest pain, sob and states her d-dimer was elevated. Pt states this all started Monday. Pt states she was dx with viral infection. Pt also states she had heart cath done week ago.   Pt also states left ear pain and feels like a cotton ball is stuck in it. Pt states when she lays down she hears rattling. Pt states she just needs breathing treatment and her blood checked.

## 2021-12-23 NOTE — Progress Notes (Deleted)
Cardiology Office Note    Date:  12/23/2021   ID:  Rebecca Duffy, DOB [birth year], MRN [id number]  PCP:  Patient, No Pcp Per  Cardiologist:  Lorine Bears, MD  Electrophysiologist:  None   Chief Complaint: Hospital follow-up  History of Present Illness:   Rebecca Duffy is a 41 y.o. female with history of normal coronary arteries by LHC in 11/2021, bipolar, PTSD, OCD, ADHD, and alcohol/tobacco/cannabinoid use who presents for hospital follow-up as outlined below.  She was admitted to the hospital in 11/2021 with chest pain.  Leading up to her admission, she had been sick with GI symptoms followed by the development of substernal chest pressure.  Initial and peak high-sensitivity troponin of 233.  EKG showed sinus rhythm with no acute ST-T changes.  CTA of the chest was negative for PE, pneumonia, or other acute cardiopulmonary process.  LHC on 11/24/2021 showed normal coronary arteries with mildly elevated LVEDP.  Echo during the admission showed an EF of 60 to 65%, no regional wall motion abnormalities, normal LV diastolic function parameters, normal RV systolic function and ventricular cavity size, no significant valvular abnormalities, and an estimated right atrial pressure of 3 mmHg.  Presentation was felt to be due to likely myocarditis.  Interventional cardiology recommended the patient take colchicine for 1 week.  She was seen in the ED on 12/17/2021 with cough with associated left-sided chest discomfort, intermittent fever and shortness of breath.  D-dimer and high-sensitivity troponin negative.  Chest x-ray showed no acute cardiopulmonary process.  Chest pain was felt to be musculoskeletal in the setting of cough.  She was diagnosed with bronchitis.  ***   Labs independently reviewed: 11/2021 - Hgb 12.9, PLT 303, potassium 3.3, BUN 5, serum creatinine 0.59, TC 131, TG 110, HDL 33, LDL 76, A1c 4.8 10/2019 - TSH normal  Past Medical History:  Diagnosis Date   ADHD    Bipolar 1  disorder (HCC)    Gastric ulcer    OCD (obsessive compulsive disorder)    PTSD (post-traumatic stress disorder)     Past Surgical History:  Procedure Laterality Date   BREAST ENHANCEMENT SURGERY     CESAREAN SECTION     LEFT HEART CATH AND CORONARY ANGIOGRAPHY N/A 11/24/2021   Procedure: LEFT HEART CATH AND CORONARY ANGIOGRAPHY;  Surgeon: Iran Ouch, MD;  Location: [facility] INVASIVE CV LAB;  Service: Cardiovascular;  Laterality: N/A;   TUBAL LIGATION      Current Medications: No outpatient medications have been marked as taking for the 12/31/21 encounter (Appointment) with Sondra Barges, PA-C.    Allergies:   Penicillins, Latex, and Nsaids   Social History   Socioeconomic History   Marital status: Divorced    Spouse name: Not on file   Number of children: Not on file   Years of education: Not on file   Highest education level: Not on file  Occupational History   Not on file  Tobacco Use   Smoking status: Some Days    Types: Cigarettes   Smokeless tobacco: Never  Vaping Use   Vaping Use: Every day  Substance and Sexual Activity   Alcohol use: Yes    Alcohol/week: 6.0 standard drinks of alcohol    Types: 6 Cans of beer per week   Drug use: Yes    Frequency: 2.0 times per week    Types: Marijuana   Sexual activity: Not on file  Other Topics Concern   Not on file  Social History Narrative  Not on file   Social Determinants of Health   Financial Resource Strain: Not on file  Food Insecurity: Food Insecurity Present (11/24/2021)   Hunger Vital Sign    Worried About Running Out of Food in the Last Year: Sometimes true    Ran Out of Food in the Last Year: Sometimes true  Transportation Needs: No Transportation Needs (11/24/2021)   PRAPARE - Administrator, Civil Service (Medical): No    Lack of Transportation (Non-Medical): No  Physical Activity: Not on file  Stress: Not on file  Social Connections: Not on file     Family History:  The patient's  family history includes Healthy in her mother; Other in her father.  ROS:   12-point review of systems is negative unless otherwise noted in the HPI.   EKGs/Labs/Other Studies Reviewed:    Studies reviewed were summarized above. The additional studies were reviewed today:  LHC 11/24/2021: 1.  Normal coronary arteries. 2.  Left ventricular angiography was not performed EF was normal by echo.  Mildly elevated left ventricular end-diastolic pressure.   Recommendations: Suspect that the patient's presentation is likely due to mild myocarditis.  Based on cardiac catheterization, this was not a myocardial infarction.  No need for a statin beta-blocker or cardiac rehab. I added colchicine to be used for 1 week. Suspect that the patient can likely be discharged home tomorrow if no other issues. __________  2D echo 11/24/2021: 1. Left ventricular ejection fraction, by estimation, is 60 to 65%. The  left ventricle has normal function. The left ventricle has no regional  wall motion abnormalities. Left ventricular diastolic parameters were  normal.   2. Right ventricular systolic function is normal. The right ventricular  size is normal. Tricuspid regurgitation signal is inadequate for assessing  PA pressure.   3. The mitral valve is normal in structure. No evidence of mitral valve  regurgitation. No evidence of mitral stenosis.   4. The aortic valve is normal in structure. Aortic valve regurgitation is  not visualized. No aortic stenosis is present.   5. The inferior vena cava is normal in size with greater than 50%  respiratory variability, suggesting right atrial pressure of 3 mmHg.    EKG:  EKG is ordered today.  The EKG ordered today demonstrates ***  Recent Labs: 11/24/2021: B Natriuretic Peptide 239.9 12/17/2021: BUN 5; Creatinine, Ser 0.59; Hemoglobin 12.9; Platelets 303; Potassium 3.3; Sodium 138  Recent Lipid Panel    Component Value Date/Time   CHOL 131 11/25/2021 0135    TRIG 110 11/25/2021 0135   HDL 33 (L) 11/25/2021 0135   CHOLHDL 4.0 11/25/2021 0135   VLDL 22 11/25/2021 0135   LDLCALC 76 11/25/2021 0135    PHYSICAL EXAM:    VS:  There were no vitals taken for this visit.  BMI: There is no height or weight on file to calculate BMI.  Physical Exam  Wt Readings from Last 3 Encounters:  11/24/21 190 lb (86.2 kg)  06/26/20 187 lb (84.8 kg)  03/07/19 195 lb 15.8 oz (88.9 kg)     ASSESSMENT & PLAN:   Presumed myopericarditis:  Alcohol/tobacco/cannabis use:   {Are you ordering a CV Procedure (e.g. stress test, cath, DCCV, TEE, etc)?   Press F2        :[id number]}     Disposition: F/u with Dr. Kirke Corin or an APP in ***.   Medication Adjustments/Labs and Tests Ordered: Current medicines are reviewed at length with the patient today.  Concerns  regarding medicines are outlined above. Medication changes, Labs and Tests ordered today are summarized above and listed in the Patient Instructions accessible in Encounters.   Signed, Eula Listen, PA-C 12/23/2021 2:02 PM     [facility] [place] [address] [place], [place] [address] [phone]

## 2021-12-31 ENCOUNTER — Ambulatory Visit: Payer: No Typology Code available for payment source | Attending: Physician Assistant | Admitting: Physician Assistant

## 2021-12-31 DIAGNOSIS — I319 Disease of pericardium, unspecified: Secondary | ICD-10-CM

## 2022-01-01 ENCOUNTER — Encounter: Payer: Self-pay | Admitting: Physician Assistant

## 2022-03-02 ENCOUNTER — Emergency Department: Payer: Medicaid Other

## 2022-03-02 ENCOUNTER — Emergency Department
Admission: EM | Admit: 2022-03-02 | Discharge: 2022-03-02 | Disposition: A | Discharge status: Home / Self Care | Payer: Medicaid Other | Attending: Emergency Medicine | Admitting: Emergency Medicine

## 2022-03-02 ENCOUNTER — Encounter: Payer: Self-pay | Admitting: Radiology

## 2022-03-02 ENCOUNTER — Other Ambulatory Visit: Payer: Self-pay

## 2022-03-02 DIAGNOSIS — E860 Dehydration: Secondary | ICD-10-CM | POA: Insufficient documentation

## 2022-03-02 DIAGNOSIS — Z20822 Contact with and (suspected) exposure to covid-19: Secondary | ICD-10-CM | POA: Insufficient documentation

## 2022-03-02 DIAGNOSIS — F1410 Cocaine abuse, uncomplicated: Secondary | ICD-10-CM | POA: Insufficient documentation

## 2022-03-02 DIAGNOSIS — B349 Viral infection, unspecified: Secondary | ICD-10-CM

## 2022-03-02 DIAGNOSIS — F1910 Other psychoactive substance abuse, uncomplicated: Secondary | ICD-10-CM

## 2022-03-02 DIAGNOSIS — K2930 Chronic superficial gastritis without bleeding: Secondary | ICD-10-CM | POA: Diagnosis not present

## 2022-03-02 DIAGNOSIS — R1013 Epigastric pain: Secondary | ICD-10-CM | POA: Diagnosis present

## 2022-03-02 DIAGNOSIS — K2900 Acute gastritis without bleeding: Secondary | ICD-10-CM

## 2022-03-02 LAB — CBC
HCT: 39.6 % (ref 36.0–46.0)
Hemoglobin: 13.5 g/dL (ref 12.0–15.0)
MCH: 30.1 pg (ref 26.0–34.0)
MCHC: 34.1 g/dL (ref 30.0–36.0)
MCV: 88.4 fL (ref 80.0–100.0)
Platelets: 308 10*3/uL (ref 150–400)
RBC: 4.48 MIL/uL (ref 3.87–5.11)
RDW: 13.1 % (ref 11.5–15.5)
WBC: 12 10*3/uL — ABNORMAL HIGH (ref 4.0–10.5)
nRBC: 0 % (ref 0.0–0.2)

## 2022-03-02 LAB — BASIC METABOLIC PANEL
Anion gap: 13 (ref 5–15)
BUN: 15 mg/dL (ref 6–20)
CO2: 19 mmol/L — ABNORMAL LOW (ref 22–32)
Calcium: 8.6 mg/dL — ABNORMAL LOW (ref 8.9–10.3)
Chloride: 102 mmol/L (ref 98–111)
Creatinine, Ser: 0.63 mg/dL (ref 0.44–1.00)
GFR, Estimated: >60 mL/min (ref >60)
Glucose, Bld: 103 mg/dL — ABNORMAL HIGH (ref 70–99)
Potassium: 3.6 mmol/L (ref 3.5–5.1)
Sodium: 134 mmol/L — ABNORMAL LOW (ref 135–145)

## 2022-03-02 LAB — GASTROINTESTINAL PANEL BY PCR, STOOL (REPLACES STOOL CULTURE)

## 2022-03-02 LAB — HEPATIC FUNCTION PANEL
ALT: 20 U/L (ref 0–44)
AST: 25 U/L (ref 15–41)
Albumin: 4.2 g/dL (ref 3.5–5.0)
Alkaline Phosphatase: 76 U/L (ref 38–126)
Bilirubin, Direct: 0.2 mg/dL (ref 0.0–0.2)
Indirect Bilirubin: 0.9 mg/dL (ref 0.3–0.9)
Total Bilirubin: 1.1 mg/dL (ref 0.3–1.2)
Total Protein: 7.2 g/dL (ref 6.5–8.1)

## 2022-03-02 LAB — URINALYSIS, ROUTINE W REFLEX MICROSCOPIC
Bilirubin Urine: NEGATIVE
Glucose, UA: NEGATIVE mg/dL
Hgb urine dipstick: NEGATIVE
Ketones, ur: NEGATIVE mg/dL
Nitrite: NEGATIVE
Protein, ur: NEGATIVE mg/dL
Specific Gravity, Urine: 1.03 (ref 1.005–1.030)
pH: 6 (ref 5.0–8.0)

## 2022-03-02 LAB — RESP PANEL BY RT-PCR (RSV, FLU A&B, COVID)  RVPGX2
Influenza A by PCR: NEGATIVE
Influenza B by PCR: NEGATIVE
Resp Syncytial Virus by PCR: NEGATIVE
SARS Coronavirus 2 by RT PCR: NEGATIVE

## 2022-03-02 LAB — TROPONIN I (HIGH SENSITIVITY)
Troponin I (High Sensitivity): 3 ng/L (ref <18)
Troponin I (High Sensitivity): 3 ng/L (ref <18)

## 2022-03-02 LAB — LIPASE, BLOOD: Lipase: 34 U/L (ref 11–51)

## 2022-03-02 LAB — POC URINE PREG, ED: Preg Test, Ur: NEGATIVE

## 2022-03-02 LAB — LACTIC ACID, PLASMA: Lactic Acid, Venous: 1.1 mmol/L (ref 0.5–1.9)

## 2022-03-02 LAB — PROCALCITONIN: Procalcitonin: <0.1 ng/mL

## 2022-03-02 LAB — D-DIMER, QUANTITATIVE: D-Dimer, Quant: 0.92 ug/mL-FEU — ABNORMAL HIGH (ref 0.00–0.50)

## 2022-03-02 MED ORDER — SODIUM CHLORIDE 0.9 % IV BOLUS
1000.0000 mL | Freq: Once | INTRAVENOUS | Status: AC
  Administered 2022-03-02: 1000 mL via INTRAVENOUS

## 2022-03-02 MED ORDER — ALUM & MAG HYDROXIDE-SIMETH 200-200-20 MG/5ML PO SUSP
30.0000 mL | Freq: Once | ORAL | Status: AC
  Administered 2022-03-02: 30 mL via ORAL
  Filled 2022-03-02: qty 30

## 2022-03-02 MED ORDER — IOHEXOL 350 MG/ML SOLN
75.0000 mL | Freq: Once | INTRAVENOUS | Status: AC | PRN
  Administered 2022-03-02: 75 mL via INTRAVENOUS

## 2022-03-02 MED ORDER — LIDOCAINE VISCOUS HCL 2 % MT SOLN
15.0000 mL | Freq: Once | OROMUCOSAL | Status: AC
  Administered 2022-03-02: 15 mL via OROMUCOSAL
  Filled 2022-03-02: qty 15

## 2022-03-02 MED ORDER — SODIUM CHLORIDE 0.9 % IV SOLN
25.0000 mg | Freq: Once | INTRAVENOUS | Status: AC
  Administered 2022-03-02: 25 mg via INTRAVENOUS
  Filled 2022-03-02: qty 1

## 2022-03-02 MED ORDER — ONDANSETRON 4 MG PO TBDP: 4.0000 mg | ORAL_TABLET | Freq: Three times a day (TID) | ORAL | 0 refills | Status: AC | PRN

## 2022-03-02 MED ORDER — SODIUM CHLORIDE 0.9 % IV SOLN
25.0000 mg | Freq: Once | INTRAVENOUS | Status: DC
  Filled 2022-03-02: qty 1

## 2022-03-02 MED ORDER — ACETAMINOPHEN 500 MG PO TABS
1000.0000 mg | ORAL_TABLET | Freq: Once | ORAL | Status: AC
  Administered 2022-03-02: 1000 mg via ORAL
  Filled 2022-03-02: qty 2

## 2022-03-02 MED ORDER — PANTOPRAZOLE 80MG IVPB - SIMPLE MED
80.0000 mg | Freq: Once | INTRAVENOUS | Status: AC
  Administered 2022-03-02: 80 mg via INTRAVENOUS
  Filled 2022-03-02: qty 100

## 2022-03-02 MED ORDER — LACTATED RINGERS IV BOLUS
500.0000 mL | Freq: Once | INTRAVENOUS | Status: AC
  Administered 2022-03-02: 500 mL via INTRAVENOUS

## 2022-03-02 MED ORDER — DROPERIDOL 2.5 MG/ML IJ SOLN
1.2500 mg | Freq: Once | INTRAMUSCULAR | Status: AC
  Administered 2022-03-02: 1.25 mg via INTRAVENOUS
  Filled 2022-03-02: qty 2

## 2022-03-02 NOTE — ED Provider Notes (Signed)
[facility] Provider Note    Event Date/Time   First MD Initiated Contact with Patient 03/02/22 1212     (approximate)   History   Chest Pain   HPI  Rebecca Duffy is a 42 y.o. female  here with chest pain. Pt reports that she went "too hard" yesterday and ate a large, spicy meal, beer, chips, and drank alcohol while also using cocaine. She reports that she felt "very full" going to bed. Woke up this Am and vomited forcefully then has had epigastric discomfort and fullness since then. No fevers, chills. H/o prior NSTEMI with clean coronaries months ago. No leg swelling. Pain is fairly constant. Unable to eat today 2/2 nausea and pain.       Physical Exam   Triage Vital Signs: ED Triage Vitals [03/02/22 1219]  Enc Vitals Group     BP 133/88     Pulse Rate (!) 107     Resp (!) 25     Temp 99.9 F (37.7 C)     Temp Source Oral     SpO2 99 %     Weight      Height      Head Circumference      Peak Flow      Pain Score      Pain Loc      Pain Edu?      Excl. in [place]?     Most recent vital signs: Vitals:   03/02/22 1636 03/02/22 1707  BP:  105/74  Pulse:  94  Resp:  18  Temp: (!) 100.4 F (38 C)   SpO2:  96%     General: Awake, no distress. . CV:  Good peripheral perfusion.  Resp:  Normal effort. Lungs clear to auscultation bilaterally. Abd:  No distention. No tenderness. No distension. No rebound or guarding. Other:  Mildly dry MM.   ED Results / Procedures / Treatments   Labs (all labs ordered are listed, but only abnormal results are displayed) Labs Reviewed  BASIC METABOLIC PANEL - Abnormal; Notable for the following components:      Result Value   Sodium 134 (*)    CO2 19 (*)    Glucose, Bld 103 (*)    Calcium 8.6 (*)    All other components within normal limits  CBC - Abnormal; Notable for the following components:   WBC 12.0 (*)    All other components within normal limits  D-DIMER, QUANTITATIVE - Abnormal; Notable  for the following components:   D-Dimer, Quant 0.92 (*)    All other components within normal limits  URINALYSIS, ROUTINE W REFLEX MICROSCOPIC - Abnormal; Notable for the following components:   Color, Urine YELLOW (*)    APPearance CLEAR (*)    Leukocytes,Ua MODERATE (*)    Bacteria, UA RARE (*)    All other components within normal limits  RESP PANEL BY RT-PCR (RSV, FLU A&B, COVID)  RVPGX2  HEPATIC FUNCTION PANEL  LIPASE, BLOOD  LACTIC ACID, PLASMA  PROCALCITONIN  LACTIC ACID, PLASMA  PROCALCITONIN  POC URINE PREG, ED  TROPONIN I (HIGH SENSITIVITY)  TROPONIN I (HIGH SENSITIVITY)     EKG Sinus tachycardia, ventricular rate 106.  PR 136, QRS 1 2, QTc 433.  No acute ST elevations or depressions.  Nonspecific changes, no ST elevations.   RADIOLOGY    I also independently reviewed and agree with radiologist interpretations.   PROCEDURES:  Critical Care performed: No  .1-3 Lead EKG Interpretation  Performed by: Duffy Bruce, MD Authorized by: Duffy Bruce, MD     Interpretation: normal     ECG rate:  90-110   ECG rate assessment: normal     Rhythm: sinus rhythm     Ectopy: none     Conduction: normal   Comments:     Indication: Chest pain     MEDICATIONS ORDERED IN ED: Medications  pantoprazole (PROTONIX) 80 mg /NS 100 mL IVPB (0 mg Intravenous Stopped 03/02/22 1329)  sodium chloride 0.9 % bolus 1,000 mL (0 mLs Intravenous Stopped 03/02/22 1540)  sodium chloride 0.9 % bolus 1,000 mL (0 mLs Intravenous Stopped 03/02/22 1540)  promethazine (PHENERGAN) 25 mg in sodium chloride 0.9 % 50 mL IVPB (0 mg Intravenous Stopped 03/02/22 1540)  iohexol (OMNIPAQUE) 350 MG/ML injection 75 mL (75 mLs Intravenous Contrast Given 03/02/22 1349)  alum & mag hydroxide-simeth (MAALOX/MYLANTA) 200-200-20 MG/5ML suspension 30 mL (30 mLs Oral Given 03/02/22 1542)  acetaminophen (TYLENOL) tablet 1,000 mg (1,000 mg Oral Given 03/02/22 1542)  lidocaine (XYLOCAINE) 2 % viscous mouth  solution 15 mL (15 mLs Mouth/Throat Given 03/02/22 1542)  lactated ringers bolus 500 mL (500 mLs Intravenous New Bag/Given 03/02/22 1706)  droperidol (INAPSINE) 2.5 MG/ML injection 1.25 mg (1.25 mg Intravenous Given 03/02/22 1906)     IMPRESSION / MDM / ASSESSMENT AND PLAN / ED COURSE  I reviewed the triage vital signs and the nursing notes.                             Differential diagnosis includes, but is not limited to, gastritis, esophagitis, Borehaave's, aspiration PNA, mediastinitis, less likely ACS, myocarditis, PE   Patient's presentation is most consistent with acute presentation with potential threat to life or bodily function.  The patient is on the cardiac monitor to evaluate for evidence of arrhythmia and/or significant heart rate changes.  42 yo F with PMHx polysubstance use, prior ? Myocarditis vs cocaine-induced NSTEMI in November, here with chest pain, nausea, vomiting after cocaine use, alcohol use, and large volume eating last night. Suspect gastritis/gerd clinically, though Borehaave's a consideration given her pain and tachycardia. CT Chest obtained, reviewed, and is negative. This was run as a CT Angio which also showed no PNA, PE. Otherwise, EKG is nonischemic and trop negative despite sx >12 hr - do not suspect ACS.   While awaiting treatment, pt began to co chills, headache, myalgias. Query viral illness in setting of recent social exposures. She is nontoxic. She has no photophobia, neck stiffness, and is alert and oriented. She has a h/o migraines. Do not suspect meningitis or encephalitis clinically.  Will send for CT, UA, give fluids and fever control. Dr. Jari Pigg to f/u results and response to tx.     FINAL CLINICAL IMPRESSION(S) / ED DIAGNOSES   Final diagnoses:  Polysubstance abuse ([place])  Viral syndrome  Dehydration  Acute superficial gastritis without hemorrhage     Rx / DC Orders   ED Discharge Orders     None        Note:  This document was  prepared using Dragon voice recognition software and may include unintentional dictation errors.    Duffy Bruce, MD 03/02/22 [phone]

## 2022-03-02 NOTE — Discharge Instructions (Addendum)
We discussed admission to ensure that your symptoms were getting better and to potentially do a test tomorrow to make sure your esophagus looked okay given your chest pain but you have declined admission.  I suspect that this could just be a GI bug given the nausea vomiting diarrhea and fever.  Return to the ER if develop worsening symptoms or any other concerns  Your CT scans were otherwise reassuring and only signs of some diarrheal illness consistent with your known GI bug- stool studies pending.

## 2022-03-02 NOTE — ED Provider Notes (Signed)
5:32 PM Assumed care for off going team.   Blood pressure 105/74, pulse 94, temperature (!) 100.4 F (38 C), temperature source Oral, resp. rate 18, SpO2 96 %.  See their HPI for full report but in brief pending labs  EKG my interpretation is sinus tachycardia rate of 103 without any ST elevation no T wave inversions and normal intervals   Reevaluated patient she is feeling better still has some discomfort in her chest.  Does report marijuana use with the cocaine will trial some droperidol.  We discussed the chance of this being an esophageal perforation CT scan was negative but we discussed doing a esophagram to rule out perforation.  Patient at this time wants to hold off on this she states that she is very hungry and just wants to eat.  We discussed the risk of this worsening causing a severe infection inside of her chest and she expressed understanding.  However my suspicion is higher that this is likely to be secondary to a GI bug given her fever has already started it seems like normally the fever would take some time to develop if she had had an esophageal perforation   Patient is tolerating p.o. Midl chest pain.  She does get a little bit of tachycardia when she goes to sit up.  Discussed with patient admission for blood culture rule out, esophagram in the morning but patient reports feeling better she is having diarrhea now so I suspect this is most likely a GI pathogen.  She states that she does not want blood cultures to be taken.  She does have negative procalcitonin which is reassuring.  Patient is declining admission.  She states that she would prefer to go home.  We discussed the risk of missing an esophageal perforation and this could leading to severe infections and potentially death.  She expressed understanding and she is able to repeat the concern of missing an issue inside of her throat but she states that she just wants to sleep in her own bed and does not want to stay in the  hospital.  The nurse is there and witnesses this conversation she has the capacity to make this decision but at this time she is requesting discharge home      [name], MD 03/02/22 2021

## 2022-03-02 NOTE — ED Notes (Signed)
Pt advised by MD that remaining at hospital could lead to a conclusive diagnosis and pt stated she "did not want blood cultures and want to go home"

## 2022-03-02 NOTE — ED Triage Notes (Signed)
Pt comes with c/o cp that started this am. Pt states it scooping dull constant pain. Pt states she feels like someone is digging. Pt did do 4 lines of cocaine, 4 locos  Pt states cardiac hx. Pt states heart cath in 2023.  Pt also states she ate tons of [place] food.

## 2022-03-04 ENCOUNTER — Encounter: Payer: Self-pay | Admitting: Cardiovascular Disease

## 2022-03-05 ENCOUNTER — Telehealth: Payer: Self-pay | Admitting: Emergency Medicine

## 2022-03-05 NOTE — Telephone Encounter (Signed)
Called patient at request of md to inquire about her condition.  I left her a message asking her to call me.

## 2022-03-05 NOTE — Telephone Encounter (Signed)
Patient called me back.  She says she continues to have diarrhea every 30 minutes--and all night.  Per Dr. Concha Se patient is still symptomatic call in nitazoxamide 500 mg (1 tablet or 25 mL suspension) ORALLY every 12 hr with food for 3 days.  [facility] and [facility], and none have this medication in stock.  [facility] in [place] on [address] has it.  I called it to them and the patient says she is able to go pick it up.

## 2022-04-09 ENCOUNTER — Encounter: Payer: Self-pay | Admitting: Obstetrics and Gynecology

## 2022-05-09 ENCOUNTER — Encounter (HOSPITAL_COMMUNITY): Payer: Self-pay | Admitting: Registered Nurse

## 2022-05-09 ENCOUNTER — Other Ambulatory Visit (HOSPITAL_COMMUNITY)
Admission: EM | Admit: 2022-05-09 | Discharge: 2022-05-11 | Disposition: A | Discharge status: Home / Self Care | Payer: Medicaid Other | Attending: Psychiatry | Admitting: Psychiatry

## 2022-05-09 DIAGNOSIS — F1919 Other psychoactive substance abuse with unspecified psychoactive substance-induced disorder: Secondary | ICD-10-CM | POA: Diagnosis not present

## 2022-05-09 DIAGNOSIS — F1420 Cocaine dependence, uncomplicated: Secondary | ICD-10-CM | POA: Diagnosis present

## 2022-05-09 DIAGNOSIS — F1994 Other psychoactive substance use, unspecified with psychoactive substance-induced mood disorder: Secondary | ICD-10-CM | POA: Diagnosis not present

## 2022-05-09 DIAGNOSIS — F3130 Bipolar disorder, current episode depressed, mild or moderate severity, unspecified: Secondary | ICD-10-CM | POA: Diagnosis present

## 2022-05-09 DIAGNOSIS — F1020 Alcohol dependence, uncomplicated: Secondary | ICD-10-CM | POA: Diagnosis present

## 2022-05-09 DIAGNOSIS — F319 Bipolar disorder, unspecified: Secondary | ICD-10-CM | POA: Insufficient documentation

## 2022-05-09 DIAGNOSIS — Z1152 Encounter for screening for COVID-19: Secondary | ICD-10-CM | POA: Diagnosis not present

## 2022-05-09 DIAGNOSIS — F1220 Cannabis dependence, uncomplicated: Secondary | ICD-10-CM

## 2022-05-09 DIAGNOSIS — R45851 Suicidal ideations: Secondary | ICD-10-CM

## 2022-05-09 DIAGNOSIS — F17210 Nicotine dependence, cigarettes, uncomplicated: Secondary | ICD-10-CM | POA: Insufficient documentation

## 2022-05-09 DIAGNOSIS — F1910 Other psychoactive substance abuse, uncomplicated: Secondary | ICD-10-CM | POA: Diagnosis present

## 2022-05-09 LAB — CBC WITH DIFFERENTIAL/PLATELET
Abs Immature Granulocytes: 0.04 10*3/uL (ref 0.00–0.07)
Basophils Absolute: 0.1 10*3/uL (ref 0.0–0.1)
Basophils Relative: 1 %
Eosinophils Absolute: 0.6 10*3/uL — ABNORMAL HIGH (ref 0.0–0.5)
Eosinophils Relative: 6 %
HCT: 39.6 % (ref 36.0–46.0)
Hemoglobin: 13.1 g/dL (ref 12.0–15.0)
Immature Granulocytes: 0 %
Lymphocytes Relative: 32 %
Lymphs Abs: 3 10*3/uL (ref 0.7–4.0)
MCH: 29.9 pg (ref 26.0–34.0)
MCHC: 33.1 g/dL (ref 30.0–36.0)
MCV: 90.4 fL (ref 80.0–100.0)
Monocytes Absolute: 0.8 10*3/uL (ref 0.1–1.0)
Monocytes Relative: 9 %
Neutro Abs: 4.9 10*3/uL (ref 1.7–7.7)
Neutrophils Relative %: 52 %
Platelets: 469 10*3/uL — ABNORMAL HIGH (ref 150–400)
RBC: 4.38 MIL/uL (ref 3.87–5.11)
RDW: 12.8 % (ref 11.5–15.5)
WBC: 9.4 10*3/uL (ref 4.0–10.5)
nRBC: 0 % (ref 0.0–0.2)

## 2022-05-09 LAB — COMPREHENSIVE METABOLIC PANEL
ALT: 18 U/L (ref 0–44)
AST: 17 U/L (ref 15–41)
Albumin: 4.1 g/dL (ref 3.5–5.0)
Alkaline Phosphatase: 82 U/L (ref 38–126)
Anion gap: 15 (ref 5–15)
BUN: 9 mg/dL (ref 6–20)
CO2: 21 mmol/L — ABNORMAL LOW (ref 22–32)
Calcium: 9.8 mg/dL (ref 8.9–10.3)
Chloride: 101 mmol/L (ref 98–111)
Creatinine, Ser: 0.81 mg/dL (ref 0.44–1.00)
GFR, Estimated: >60 mL/min (ref >60)
Glucose, Bld: 127 mg/dL — ABNORMAL HIGH (ref 70–99)
Potassium: 3.9 mmol/L (ref 3.5–5.1)
Sodium: 137 mmol/L (ref 135–145)
Total Bilirubin: 1.2 mg/dL (ref 0.3–1.2)
Total Protein: 6.5 g/dL (ref 6.5–8.1)

## 2022-05-09 LAB — LIPID PANEL
Cholesterol: 247 mg/dL — ABNORMAL HIGH (ref 0–200)
HDL: 69 mg/dL (ref >40)
LDL Cholesterol: 131 mg/dL — ABNORMAL HIGH (ref 0–99)
Total CHOL/HDL Ratio: 3.6 RATIO
Triglycerides: 233 mg/dL — ABNORMAL HIGH (ref <150)
VLDL: 47 mg/dL — ABNORMAL HIGH (ref 0–40)

## 2022-05-09 LAB — URINALYSIS, ROUTINE W REFLEX MICROSCOPIC
Bilirubin Urine: NEGATIVE
Glucose, UA: NEGATIVE mg/dL
Hgb urine dipstick: NEGATIVE
Ketones, ur: NEGATIVE mg/dL
Leukocytes,Ua: NEGATIVE
Nitrite: NEGATIVE
Protein, ur: NEGATIVE mg/dL
Specific Gravity, Urine: 1.01 (ref 1.005–1.030)
pH: 5 (ref 5.0–8.0)

## 2022-05-09 LAB — TSH: TSH: 2.241 u[IU]/mL (ref 0.350–4.500)

## 2022-05-09 LAB — POCT URINE DRUG SCREEN - MANUAL ENTRY (I-SCREEN)
POC Amphetamine UR: NOT DETECTED
POC Buprenorphine (BUP): NOT DETECTED
POC Cocaine UR: POSITIVE — AB
POC Marijuana UR: POSITIVE — AB
POC Methadone UR: NOT DETECTED
POC Methamphetamine UR: NOT DETECTED
POC Morphine: NOT DETECTED
POC Oxazepam (BZO): NOT DETECTED
POC Oxycodone UR: NOT DETECTED
POC Secobarbital (BAR): NOT DETECTED

## 2022-05-09 LAB — POC SARS CORONAVIRUS 2 AG: SARSCOV2ONAVIRUS 2 AG: NEGATIVE

## 2022-05-09 LAB — HEMOGLOBIN A1C
Hgb A1c MFr Bld: 5.1 % (ref 4.8–5.6)
Mean Plasma Glucose: 99.67 mg/dL

## 2022-05-09 LAB — MAGNESIUM: Magnesium: 2.3 mg/dL (ref 1.7–2.4)

## 2022-05-09 LAB — POC URINE PREG, ED: Preg Test, Ur: NEGATIVE

## 2022-05-09 LAB — SARS CORONAVIRUS 2 BY RT PCR: SARS Coronavirus 2 by RT PCR: NEGATIVE

## 2022-05-09 LAB — ETHANOL: Alcohol, Ethyl (B): 34 mg/dL — ABNORMAL HIGH (ref <10)

## 2022-05-09 MED ORDER — CHLORDIAZEPOXIDE HCL 25 MG PO CAPS: 25.0000 mg | ORAL_CAPSULE | Freq: Four times a day (QID) | ORAL | Status: DC | PRN

## 2022-05-09 MED ORDER — TRAZODONE HCL 50 MG PO TABS
50.0000 mg | ORAL_TABLET | Freq: Every evening | ORAL | Status: DC | PRN
  Administered 2022-05-09 – 2022-05-10 (×2): 50 mg via ORAL
  Filled 2022-05-09 (×2): qty 1

## 2022-05-09 MED ORDER — ONDANSETRON 4 MG PO TBDP: 4.0000 mg | ORAL_TABLET | Freq: Four times a day (QID) | ORAL | Status: DC | PRN

## 2022-05-09 MED ORDER — ACETAMINOPHEN 325 MG PO TABS
650.0000 mg | ORAL_TABLET | Freq: Four times a day (QID) | ORAL | Status: DC | PRN
  Administered 2022-05-09: 650 mg via ORAL
  Filled 2022-05-09: qty 2

## 2022-05-09 MED ORDER — ADULT MULTIVITAMIN W/MINERALS CH
1.0000 | ORAL_TABLET | Freq: Every day | ORAL | Status: DC
  Administered 2022-05-09 – 2022-05-11 (×3): 1 via ORAL
  Filled 2022-05-09 (×3): qty 1

## 2022-05-09 MED ORDER — PANTOPRAZOLE SODIUM 40 MG PO TBEC
40.0000 mg | DELAYED_RELEASE_TABLET | Freq: Every day | ORAL | Status: DC
  Administered 2022-05-09 – 2022-05-11 (×3): 40 mg via ORAL
  Filled 2022-05-09 (×3): qty 1

## 2022-05-09 MED ORDER — THIAMINE HCL 100 MG/ML IJ SOLN
100.0000 mg | Freq: Once | INTRAMUSCULAR | Status: DC
  Filled 2022-05-09: qty 2

## 2022-05-09 MED ORDER — HYDROXYZINE HCL 25 MG PO TABS
25.0000 mg | ORAL_TABLET | Freq: Four times a day (QID) | ORAL | Status: DC | PRN
  Administered 2022-05-09 – 2022-05-10 (×3): 25 mg via ORAL
  Filled 2022-05-09 (×3): qty 1

## 2022-05-09 MED ORDER — ALUM & MAG HYDROXIDE-SIMETH 200-200-20 MG/5ML PO SUSP: 30.0000 mL | ORAL | Status: DC | PRN

## 2022-05-09 MED ORDER — NICOTINE 21 MG/24HR TD PT24
21.0000 mg | MEDICATED_PATCH | Freq: Every day | TRANSDERMAL | Status: DC
  Administered 2022-05-09 – 2022-05-11 (×3): 21 mg via TRANSDERMAL
  Filled 2022-05-09 (×3): qty 1

## 2022-05-09 MED ORDER — MAGNESIUM HYDROXIDE 400 MG/5ML PO SUSP: 30.0000 mL | Freq: Every day | ORAL | Status: DC | PRN

## 2022-05-09 MED ORDER — LOPERAMIDE HCL 2 MG PO CAPS
2.0000 mg | ORAL_CAPSULE | ORAL | Status: DC | PRN
  Administered 2022-05-11: 4 mg via ORAL
  Filled 2022-05-09: qty 2

## 2022-05-09 NOTE — Progress Notes (Signed)
   05/09/22 1449  [facility] Triage Screening (Walk-ins at [facility] only)  How Did You Hear About [place]? Family/Friend  What Is the Reason for Your Visit/Call Today? Pt presents to [facility] voluntarily accompanied by her daughter seeking detox and substance use treatment. 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 Last use of alcohol was today about 2 hours ago; 4 beers, 1 four loko, and an unknown amount of liquor. Last use of cocaine was lastnight about $200 worth. Pt reports being diagnosed with PTSD, OCD,ADHD,Anxiety, Bipolar, manic depression. Pt states she has been without medication since 2019. Pt reports seizures in the past when she uses substances, last seizure was about 10 years ago. Pts daughter reports the pts substance use increased after her father passed away 2 years ago from suicide. Pt reports she has not slept in 3 days and has not ate anything in about 4 days. 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 Pt was last seen by her psychiatrist Donell Sievert at [facility] on 4/18. Pt states she tries to keep her children away from her drug use by leaving the home and using in hotel rooms with her friends. Pt reports she is intoxicated at this moment. Pt is disheveled, tearful at times and laughing at other moments. Pt denies SI/HI and AVH at this time.  How Long Has This Been Causing You Problems? > than 6 months  Have You Recently Had Any Thoughts About Hurting Yourself? No  Are You Planning to Commit Suicide/Harm Yourself At This time? No  Have you Recently Had Thoughts About Hurting Someone Karolee Ohs? No  Are You Planning To Harm Someone At This Time? No  Are you currently experiencing any auditory, visual or other hallucinations? No  Have You Used Any Alcohol or Drugs in the Past 24 Hours? Yes  How long ago did you use Drugs or Alcohol? today  What Did You Use and How Much? 4 beers, 1 four loko,  and an unknown amount of liquor. Last use of cocaine was lastnight about $200 worth.  Do you have any current medical co-morbidities that require immediate attention? No  Clinician description of patient physical appearance/behavior: disheveled, laughing and crying, intoxicated  What Do You Feel Would Help You the Most Today? Alcohol or Drug Use Treatment  If access to [facility] was not available, would you have sought care in the Emergency Department? No  Determination of Need Emergent (2 hours)  Options For Referral Facility-Based Crisis;Medication Management;Outpatient Therapy;BH Urgent Care

## 2022-05-09 NOTE — ED Notes (Signed)
This writer attempted to complete a CPS report due to information provided by the patient and her daughter. A voicemail was left on the answering machine and this writer will inform the next shift.

## 2022-05-09 NOTE — ED Notes (Signed)
[facility] lab courier called to transport labs to [facility] lab 

## 2022-05-09 NOTE — ED Provider Notes (Addendum)
Facility Based Crisis Admission H&P  Date: 05/19/22 Patient Name: Rebecca Duffy MRN: [id number] Chief Complaint: Polysubstance abuse, requesting assistance with detox  Diagnoses:  Final diagnoses:  Substance induced mood disorder (HCC)  Polysubstance abuse (HCC)  Cocaine use disorder, moderate, dependence (HCC)  Cannabis use disorder, severe, dependence (HCC)  Alcohol use disorder, severe, dependence (HCC)  Bipolar I disorder, most recent episode depressed (HCC)  Passive suicidal ideations    HPI: Rebecca Duffy 42 y.o., female patient with history of Bipolar disorder, PTSD, polysubstance abuse (cocaine, marijuana, alcohol, and opiates) admitted to [facility] after presenting voluntarily as a walk in accompanied by her 64 yr old daughter with complaints of polysubstance abuse and wanting detox.    Rebecca Duffy seen face to face by this provider, consulted with Dr. Jannifer Franklin; and chart reviewed on 05/19/22.  On evaluation Rebecca Duffy gives permission for her 65 yr old daughter to sit in on psychiatric assessment.  Patient states she has a chronic history of drug abuse and she started celebrating her birthday and has been out of the home getting high for the last 2 days.  Patient states today she has had 4 beers (4 [employer] Loco) and $400.00 cocaine over the last 3 days.   Patient daughter at her side and states that her mother is also having suicidal thoughts.  "She goes off and gets high and gets mad at herself for doing stupid shit."  Patient then states "I don't want to die I just get upset with myself and just want to Duffy.  Sometimes don't care if I don't wake up but I don't want to die.  I have 2 children I care for."  Daughter states that her mother has been getting high or intoxicated daily since "Her uncle killed himself on his birthday 2 yrs ago."  Patient denies active suicidal ideation and is able to contract for safety.  She also denies homicidal ideation, psychosis, and paranoia.  Reports  she is employed working for [employer] with Rebecca Duffy.  States she lives with her 2 daughters and her daughters boyfriend.  Patient daughter that is present 16 yr states she dropped out of highschool and is working to help take care of things.  Younger sister 107 yrs old.  Daughter makes a statement to her mother "I'll take care of the rent if you don't have the money when you get paid."  Patient stating that she started outpatient psychiatric services last week at [facility] wit Donell Sievert, PA for medication management.  Reports that she didn't get the prescription filled related to being high last several days.  Unable to give the name of medication she was prescribed.   During evaluation Rebecca Duffy is sitting in chair with no noted distress.  She is alert/oriented x 4, calm, cooperative, and attentive, but does appear to be under the influence.  Her responses were appropriate to assessment questions.  Her mood is anxious and dysphoric with congruent affect.  She spoke in a clear tone at moderate volume, and normal pace, with good eye contact.   She denies active suicidal/self-harm, homicidal ideation, psychosis, and paranoia.  Objectively:  there is no evidence of psychosis/mania or delusional thinking.  She conversed coherently, with goal directed thoughts, and no distractibility, or pre-occupation.   PHQ 2-9:  Flowsheet Row ED from 05/09/2022 in [facility] ED from 10/29/2019 in [facility]  Thoughts that you would be better off dead, or of hurting yourself  in some way Not at all Not at all  PHQ-9 Total Score 0 14       Flowsheet Row ED from 05/09/2022 in [facility] ED from 03/02/2022 in [facility] Emergency Department at [facility] ED to Hosp-Admission (Discharged) from 11/24/2021 in [facility]  C-SSRS RISK CATEGORY No Risk No Risk No Risk        Total Time  spent with patient: 45 minutes  Musculoskeletal  Strength & Muscle Tone: within normal limits Gait & Station: normal Patient leans: N/A  Psychiatric Specialty Exam  Presentation General Appearance:  Disheveled  Eye Contact: Fair  Speech: Clear and Coherent; Normal Rate  Speech Volume: Normal  Handedness: Right   Mood and Affect  Mood: Anxious; Dysphoric  Affect: Congruent   Thought Process  Thought Processes: Coherent; Goal Directed  Descriptions of Associations:Intact  Orientation:Full (Time, Place and Person)  Thought Content:Logical; WDL    Hallucinations:No data recorded  Ideas of Reference:None  Suicidal Thoughts:No data recorded  Homicidal Thoughts:No data recorded   Sensorium  Memory: Immediate Good; Recent Good  Judgment: Intact  Insight: Present   Executive Functions  Concentration:Good  Attention Span: Good  Recall: Good  Fund of Knowledge: Good  Language: Good   Psychomotor Activity  Psychomotor Activity: No data recorded   Assets  Assets: Communication Skills; Desire for Improvement; Financial Resources/Insurance; Housing; Physical Health; Resilience; Social Support   Duffy  Duffy: No data recorded   No data recorded   Physical Exam Vitals and nursing note reviewed.  Constitutional:      General: She is not in acute distress.    Appearance: Normal appearance. She is not ill-appearing.  HENT:     Head: Normocephalic.  Eyes:     Conjunctiva/sclera: Conjunctivae normal.  Cardiovascular:     Rate and Rhythm: Tachycardia present.  Pulmonary:     Effort: Pulmonary effort is normal. No respiratory distress.  Musculoskeletal:        General: Normal range of motion.     Cervical back: Normal range of motion.  Skin:    General: Skin is warm and dry.  Neurological:     Mental Status: She is alert and oriented to person, place, and time.  Psychiatric:        Attention and Perception: Attention and  perception normal. She does not perceive auditory or visual hallucinations.        Mood and Affect: Affect normal. Mood is anxious.        Speech: Speech normal.        Behavior: Behavior normal. Behavior is cooperative.        Thought Content: Thought content is not paranoid or delusional. Thought content does not include homicidal ideation. Suicidal: Passive thoughts with no intent or plan.Thought content does not include suicidal plan.        Cognition and Memory: Cognition normal.        Judgment: Judgment is impulsive.    Review of Systems  Constitutional:        No complaints voicee  Gastrointestinal:        Daughter reporting it has been 3 days since patient has ate.  Patient then states "I drink beer it has nutrients in it."   Neurological:  Seizures: States she had one seizure 10 yrs ago after "doing a bowl of cocaine and some uppers.  But I don't get high like that no more.".  Psychiatric/Behavioral:  Depression: Dysphoric. Substance abuse: Cocaine, alcohol,  and history of opiate (pain pill) misuse. Suicidal ideas: States "I just want to slee."  States she doesn't want to die "I have children that I care for.". The patient is nervous/anxious. Insomnia: Doesn't Duffy well when high.  Reports it has been 3 nights since she slept.  All other systems reviewed and are negative.   Blood pressure 121/88, pulse 99, temperature 98 F (36.7 C), temperature source Oral, resp. rate 16, SpO2 99 %. There is no height or weight on file to calculate BMI.  Past Psychiatric History: Bipolar disorder, PTSD, polysubstance abuse (cocaine, marijuana, alcohol, and opiates), and ADHD   Is the patient at risk to self? No  Has the patient been a risk to self in the past 6 months? No .    Has the patient been a risk to self within the distant past? No   Is the patient a risk to others? No   Has the patient been a risk to others in the past 6 months? No   Has the patient been a risk to others within the  distant past? No   Past Medical History:  Past Medical History:  Diagnosis Date   ADHD    Bipolar 1 disorder (HCC)    Gastric ulcer    OCD (obsessive compulsive disorder)    PTSD (post-traumatic stress disorder)      Family History: Maternal uncle committed suicide Family History  Problem Relation Age of Onset   Healthy Mother    Other Father        unknown medical history    Social History:  Social History   Tobacco Use   Smoking status: Some Days    Types: Cigarettes   Smokeless tobacco: Never  Vaping Use   Vaping Use: Every day  Substance Use Topics   Alcohol use: Yes    Alcohol/week: 6.0 standard drinks of alcohol    Types: 6 Cans of beer per week   Drug use: Yes    Frequency: 2.0 times per week    Types: Marijuana     Last Labs:  Admission on 05/09/2022, Discharged on 05/11/2022  Component Date Value Ref Range Status   WBC 05/09/2022 9.4  4.0 - 10.5 K/uL Final   RBC 05/09/2022 4.38  3.87 - 5.11 MIL/uL Final   Hemoglobin 05/09/2022 13.1  12.0 - 15.0 g/dL Final   HCT 53/66/4403 39.6  36.0 - 46.0 % Final   MCV 05/09/2022 90.4  80.0 - 100.0 fL Final   MCH 05/09/2022 29.9  26.0 - 34.0 pg Final   MCHC 05/09/2022 33.1  30.0 - 36.0 g/dL Final   RDW 47/42/5956 12.8  11.5 - 15.5 % Final   Platelets 05/09/2022 469 (H)  150 - 400 K/uL Final   nRBC 05/09/2022 0.0  0.0 - 0.2 % Final   Neutrophils Relative % 05/09/2022 52  % Final   Neutro Abs 05/09/2022 4.9  1.7 - 7.7 K/uL Final   Lymphocytes Relative 05/09/2022 32  % Final   Lymphs Abs 05/09/2022 3.0  0.7 - 4.0 K/uL Final   Monocytes Relative 05/09/2022 9  % Final   Monocytes Absolute 05/09/2022 0.8  0.1 - 1.0 K/uL Final   Eosinophils Relative 05/09/2022 6  % Final   Eosinophils Absolute 05/09/2022 0.6 (H)  0.0 - 0.5 K/uL Final   Basophils Relative 05/09/2022 1  % Final   Basophils Absolute 05/09/2022 0.1  0.0 - 0.1 K/uL Final   Immature Granulocytes 05/09/2022 0  % Final  Abs Immature Granulocytes 05/09/2022 0.04   0.00 - 0.07 K/uL Final   Performed at [facility], [address], [place], [place] [address]   Sodium 05/09/2022 137  135 - 145 mmol/L Final   Potassium 05/09/2022 3.9  3.5 - 5.1 mmol/L Final   Chloride 05/09/2022 101  98 - 111 mmol/L Final   CO2 05/09/2022 21 (L)  22 - 32 mmol/L Final   Glucose, Bld 05/09/2022 127 (H)  70 - 99 mg/dL Final   Glucose reference range applies only to samples taken after fasting for at least 8 hours.   BUN 05/09/2022 9  6 - 20 mg/dL Final   Creatinine, Ser 05/09/2022 0.81  0.44 - 1.00 mg/dL Final   Calcium 60/45/4098 9.8  8.9 - 10.3 mg/dL Final   Total Protein 11/91/4782 6.5  6.5 - 8.1 g/dL Final   Albumin 95/62/1308 4.1  3.5 - 5.0 g/dL Final   AST 65/78/4696 17  15 - 41 U/L Final   ALT 05/09/2022 18  0 - 44 U/L Final   Alkaline Phosphatase 05/09/2022 82  38 - 126 U/L Final   Total Bilirubin 05/09/2022 1.2  0.3 - 1.2 mg/dL Final   GFR, Estimated 05/09/2022 >60  >60 mL/min Final   Comment: (NOTE) Calculated using the CKD-EPI Creatinine Equation (2021)    Anion gap 05/09/2022 15  5 - 15 Final   Performed at [facility], [address], [place], [place] [address]   Hgb A1c MFr Bld 05/09/2022 5.1  4.8 - 5.6 % Final   Comment: (NOTE) Pre diabetes:          5.7%-6.4%  Diabetes:              >6.4%  Glycemic control for   <7.0% adults with diabetes    Mean Plasma Glucose 05/09/2022 99.67  mg/dL Final   Performed at [facility], [address] [place], [place] [address]   Magnesium 05/09/2022 2.3  1.7 - 2.4 mg/dL Final   Performed at [facility], [address], [place], [place] [address]   Alcohol, Ethyl (B) 05/09/2022 34 (H)  <10 mg/dL Final   Comment: (NOTE) Lowest detectable limit for serum alcohol is 10 mg/dL.  For medical purposes only. Performed at [facility], [address], [place], [place] [address]    Cholesterol 05/09/2022 247 (H)  0 - 200 mg/dL Final   Triglycerides 64/40/3474 233 (H)  <150 mg/dL  Final   HDL 25/95/6387 69  >40 mg/dL Final   Total CHOL/HDL Ratio 05/09/2022 3.6  RATIO Final   VLDL 05/09/2022 47 (H)  0 - 40 mg/dL Final   LDL Cholesterol 05/09/2022 131 (H)  0 - 99 mg/dL Final   Comment:        Total Cholesterol/HDL:CHD Risk Coronary Heart Disease Risk Table                     Men   Women  1/2 Average Risk   3.4   3.3  Average Risk       5.0   4.4  2 X Average Risk   9.6   7.1  3 X Average Risk  23.4   11.0        Use the calculated Patient Ratio above and the CHD Risk Table to determine the patient's CHD Risk.        ATP III CLASSIFICATION (LDL):  <100     mg/dL  Optimal  100-129  mg/dL   Near or Above                    Optimal  130-159  mg/dL   Borderline  161-096  mg/dL   High  >045     mg/dL   Very High Performed at [facility], [address], [place], [place] [address]    Prolactin 05/09/2022 19.4  4.8 - 33.4 ng/mL Final   Comment: (NOTE) Performed At: [facility] [address] [place], [place] [id number] Jolene Schimke MD AO:[id number]    Color, Urine 05/09/2022 YELLOW  YELLOW Final   APPearance 05/09/2022 CLEAR  CLEAR Final   Specific Gravity, Urine 05/09/2022 1.010  1.005 - 1.030 Final   pH 05/09/2022 5.0  5.0 - 8.0 Final   Glucose, UA 05/09/2022 NEGATIVE  NEGATIVE mg/dL Final   Hgb urine dipstick 05/09/2022 NEGATIVE  NEGATIVE Final   Bilirubin Urine 05/09/2022 NEGATIVE  NEGATIVE Final   Ketones, ur 05/09/2022 NEGATIVE  NEGATIVE mg/dL Final   Protein, ur 96/29/5284 NEGATIVE  NEGATIVE mg/dL Final   Nitrite 13/24/4010 NEGATIVE  NEGATIVE Final   Leukocytes,Ua 05/09/2022 NEGATIVE  NEGATIVE Final   Performed at [facility], [address] [place], [place] [address]   Preg Test, Ur 05/09/2022 Negative  Negative Final   POC Amphetamine UR 05/09/2022 None Detected  NONE DETECTED (Cut Off Level 1000 ng/mL) Final   POC Secobarbital (BAR) 05/09/2022 None Detected  NONE DETECTED (Cut Off Level 300 ng/mL) Final   POC  Buprenorphine (BUP) 05/09/2022 None Detected  NONE DETECTED (Cut Off Level 10 ng/mL) Final   POC Oxazepam (BZO) 05/09/2022 None Detected  NONE DETECTED (Cut Off Level 300 ng/mL) Final   POC Cocaine UR 05/09/2022 Positive (A)  NONE DETECTED (Cut Off Level 300 ng/mL) Final   POC Methamphetamine UR 05/09/2022 None Detected  NONE DETECTED (Cut Off Level 1000 ng/mL) Final   POC Morphine 05/09/2022 None Detected  NONE DETECTED (Cut Off Level 300 ng/mL) Final   POC Methadone UR 05/09/2022 None Detected  NONE DETECTED (Cut Off Level 300 ng/mL) Final   POC Oxycodone UR 05/09/2022 None Detected  NONE DETECTED (Cut Off Level 100 ng/mL) Final   POC Marijuana UR 05/09/2022 Positive (A)  NONE DETECTED (Cut Off Level 50 ng/mL) Final   SARSCOV2ONAVIRUS 2 AG 05/09/2022 NEGATIVE  NEGATIVE Final   Comment: (NOTE) SARS-CoV-2 antigen NOT DETECTED.   Negative results are presumptive.  Negative results do not preclude SARS-CoV-2 infection and should not be used as the sole basis for treatment or other patient management decisions, including infection  control decisions, particularly in the presence of clinical signs and  symptoms consistent with COVID-19, or in those who have been in contact with the virus.  Negative results must be combined with clinical observations, patient history, and epidemiological information. The expected result is Negative.  Fact Sheet for Patients: https://www.jennings-kim.com/  Fact Sheet for Healthcare Providers: https://alexander-rogers.biz/  This test is not yet approved or cleared by the [place] FDA and  has been authorized for detection and/or diagnosis of SARS-CoV-2 by FDA under an Emergency Use Authorization (EUA).  This EUA will remain in effect (meaning this test can be used) for the duration of  the COV                          ID-19 declaration under Section 564(b)(1) of the Act, 21 U.S.C. section 360bbb-3(b)(1), unless the  authorization  is terminated or revoked sooner.     SARS Coronavirus 2 by RT PCR 05/09/2022 NEGATIVE  NEGATIVE Final   Performed at [facility], [address], [place], [place] [address]   TSH 05/09/2022 2.241  0.350 - 4.500 uIU/mL Final   Comment: Performed by a 3rd Generation assay with a functional sensitivity of <=0.01 uIU/mL. Performed at [facility], [address] [place], [place] [address]   Admission on 03/02/2022, Discharged on 03/02/2022  Component Date Value Ref Range Status   Sodium 03/02/2022 134 (L)  135 - 145 mmol/L Final   Potassium 03/02/2022 3.6  3.5 - 5.1 mmol/L Final   Chloride 03/02/2022 102  98 - 111 mmol/L Final   CO2 03/02/2022 19 (L)  22 - 32 mmol/L Final   Glucose, Bld 03/02/2022 103 (H)  70 - 99 mg/dL Final   Glucose reference range applies only to samples taken after fasting for at least 8 hours.   BUN 03/02/2022 15  6 - 20 mg/dL Final   Creatinine, Ser 03/02/2022 0.63  0.44 - 1.00 mg/dL Final   Calcium 09/81/1914 8.6 (L)  8.9 - 10.3 mg/dL Final   GFR, Estimated 03/02/2022 >60  >60 mL/min Final   Comment: (NOTE) Calculated using the CKD-EPI Creatinine Equation (2021)    Anion gap 03/02/2022 13  5 - 15 Final   Performed at [facility], [address] [place], [place] [address]   WBC 03/02/2022 12.0 (H)  4.0 - 10.5 K/uL Final   RBC 03/02/2022 4.48  3.87 - 5.11 MIL/uL Final   Hemoglobin 03/02/2022 13.5  12.0 - 15.0 g/dL Final   HCT 62/13/0865 39.6  36.0 - 46.0 % Final   MCV 03/02/2022 88.4  80.0 - 100.0 fL Final   MCH 03/02/2022 30.1  26.0 - 34.0 pg Final   MCHC 03/02/2022 34.1  30.0 - 36.0 g/dL Final   RDW 78/46/9629 13.1  11.5 - 15.5 % Final   Platelets 03/02/2022 308  150 - 400 K/uL Final   nRBC 03/02/2022 0.0  0.0 - 0.2 % Final   Performed at [facility], [address] [place], [place] [address]   Troponin I (High Sensitivity) 03/02/2022 3  <18 ng/L Final   Comment: (NOTE) Elevated high sensitivity troponin I (hsTnI)  values and significant  changes across serial measurements may suggest ACS but many other  chronic and acute conditions are known to elevate hsTnI results.  Refer to the "Links" section for chest pain algorithms and additional  guidance. Performed at [facility], [address] [place], [place] [address]    Preg Test, Ur 03/02/2022 NEGATIVE  NEGATIVE Final   Comment:        THE SENSITIVITY OF THIS METHODOLOGY IS >24 mIU/mL    Total Protein 03/02/2022 7.2  6.5 - 8.1 g/dL Final   Albumin 11/15/2534 4.2  3.5 - 5.0 g/dL Final   AST 64/40/3474 25  15 - 41 U/L Final   ALT 03/02/2022 20  0 - 44 U/L Final   Alkaline Phosphatase 03/02/2022 76  38 - 126 U/L Final   Total Bilirubin 03/02/2022 1.1  0.3 - 1.2 mg/dL Final   Bilirubin, Direct 03/02/2022 0.2  0.0 - 0.2 mg/dL Final   Indirect Bilirubin 03/02/2022 0.9  0.3 - 0.9 mg/dL Final   Performed at [facility], [address] [place], [place] [address]   Lipase 03/02/2022 34  11 - 51 U/L Final   Performed  at [facility], [address] [place], [place] [address]   D-Dimer, Sharene Butters 03/02/2022 0.92 (H)  0.00 - 0.50 ug/mL-FEU Final   Comment: (NOTE) At the manufacturer cut-off value of 0.5 g/mL FEU, this assay has a negative predictive value of 95-100%.This assay is intended for use in conjunction with a clinical pretest probability (PTP) assessment model to exclude pulmonary embolism (PE) and deep venous thrombosis (DVT) in outpatients suspected of PE or DVT. Results should be correlated with clinical presentation. Performed at [facility], [address] [place], [place] [address]    Lactic Acid, Venous 03/02/2022 1.1  0.5 - 1.9 mmol/L Final   Performed at [facility], [address] [place], [place] [address]   Troponin I (High Sensitivity) 03/02/2022 3  <18 ng/L Final   Comment: (NOTE) Elevated high sensitivity troponin I (hsTnI) values and significant  changes across serial  measurements may suggest ACS but many other  chronic and acute conditions are known to elevate hsTnI results.  Refer to the "Links" section for chest pain algorithms and additional  guidance. Performed at [facility], [address] [place], [place] [address]    Color, Urine 03/02/2022 YELLOW (A)  YELLOW Final   APPearance 03/02/2022 CLEAR (A)  CLEAR Final   Specific Gravity, Urine 03/02/2022 1.030  1.005 - 1.030 Final   pH 03/02/2022 6.0  5.0 - 8.0 Final   Glucose, UA 03/02/2022 NEGATIVE  NEGATIVE mg/dL Final   Hgb urine dipstick 03/02/2022 NEGATIVE  NEGATIVE Final   Bilirubin Urine 03/02/2022 NEGATIVE  NEGATIVE Final   Ketones, ur 03/02/2022 NEGATIVE  NEGATIVE mg/dL Final   Protein, ur 29/56/2130 NEGATIVE  NEGATIVE mg/dL Final   Nitrite 86/57/8469 NEGATIVE  NEGATIVE Final   Leukocytes,Ua 03/02/2022 MODERATE (A)  NEGATIVE Final   RBC / HPF 03/02/2022 0-5  0 - 5 RBC/hpf Final   WBC, UA 03/02/2022 0-5  0 - 5 WBC/hpf Final   Bacteria, UA 03/02/2022 RARE (A)  NONE SEEN Final   Squamous Epithelial / HPF 03/02/2022 0-5  0 - 5 /HPF Final   Performed at [facility], [address] [place], [place] [address]   SARS Coronavirus 2 by RT PCR 03/02/2022 NEGATIVE  NEGATIVE Final   Comment: (NOTE) SARS-CoV-2 target nucleic acids are NOT DETECTED.  The SARS-CoV-2 RNA is generally detectable in upper respiratory specimens during the acute phase of infection. The lowest concentration of SARS-CoV-2 viral copies this assay can detect is 138 copies/mL. A negative result does not preclude SARS-Cov-2 infection and should not be used as the sole basis for treatment or other patient management decisions. A negative result may occur with  improper specimen collection/handling, submission of specimen other than nasopharyngeal swab, presence of viral mutation(s) within the areas targeted by this assay, and inadequate number of viral copies(<138 copies/mL). A negative result must be  combined with clinical observations, patient history, and epidemiological information. The expected result is Negative.  Fact Sheet for Patients:  BloggerCourse.com  Fact Sheet for Healthcare Providers:  SeriousBroker.it  This test is no                          t yet approved or cleared by the [place] FDA and  has been authorized for detection and/or diagnosis of SARS-CoV-2 by FDA under an Emergency Use Authorization (EUA). This EUA will remain  in effect (meaning this test can be used) for the duration of the COVID-19 declaration under  Section 564(b)(1) of the Act, 21 U.S.C.section 360bbb-3(b)(1), unless the authorization is terminated  or revoked sooner.       Influenza A by PCR 03/02/2022 NEGATIVE  NEGATIVE Final   Influenza B by PCR 03/02/2022 NEGATIVE  NEGATIVE Final   Comment: (NOTE) The Xpert Xpress SARS-CoV-2/FLU/RSV plus assay is intended as an aid in the diagnosis of influenza from Nasopharyngeal swab specimens and should not be used as a sole basis for treatment. Nasal washings and aspirates are unacceptable for Xpert Xpress SARS-CoV-2/FLU/RSV testing.  Fact Sheet for Patients: BloggerCourse.com  Fact Sheet for Healthcare Providers: SeriousBroker.it  This test is not yet approved or cleared by the [place] FDA and has been authorized for detection and/or diagnosis of SARS-CoV-2 by FDA under an Emergency Use Authorization (EUA). This EUA will remain in effect (meaning this test can be used) for the duration of the COVID-19 declaration under Section 564(b)(1) of the Act, 21 U.S.C. section 360bbb-3(b)(1), unless the authorization is terminated or revoked.     Resp Syncytial Virus by PCR 03/02/2022 NEGATIVE  NEGATIVE Final   Comment: (NOTE) Fact Sheet for Patients: BloggerCourse.com  Fact Sheet for Healthcare  Providers: SeriousBroker.it  This test is not yet approved or cleared by the [place] FDA and has been authorized for detection and/or diagnosis of SARS-CoV-2 by FDA under an Emergency Use Authorization (EUA). This EUA will remain in effect (meaning this test can be used) for the duration of the COVID-19 declaration under Section 564(b)(1) of the Act, 21 U.S.C. section 360bbb-3(b)(1), unless the authorization is terminated or revoked.  Performed at [facility], [address] [place], [place] [address]    Procalcitonin 03/02/2022 <0.10  ng/mL Final   Comment:        Interpretation: PCT (Procalcitonin) <= 0.5 ng/mL: Systemic infection (sepsis) is not likely. Local bacterial infection is possible. (NOTE)       Sepsis PCT Algorithm           Lower Respiratory Tract                                      Infection PCT Algorithm    ----------------------------     ----------------------------         PCT < 0.25 ng/mL                PCT < 0.10 ng/mL          Strongly encourage             Strongly discourage   discontinuation of antibiotics    initiation of antibiotics    ----------------------------     -----------------------------       PCT 0.25 - 0.50 ng/mL            PCT 0.10 - 0.25 ng/mL               OR       >80% decrease in PCT            Discourage initiation of                                            antibiotics      Encourage discontinuation           of antibiotics    ----------------------------     -----------------------------  PCT >= 0.50 ng/mL              PCT 0.26 - 0.50 ng/mL               AND                                 <80% decrease in PCT             Encourage initiation of                                             antibiotics       Encourage continuation           of antibiotics    ----------------------------     -----------------------------        PCT >= 0.50 ng/mL                  PCT > 0.50  ng/mL               AND         increase in PCT                  Strongly encourage                                      initiation of antibiotics    Strongly encourage escalation           of antibiotics                                     -----------------------------                                           PCT <= 0.25 ng/mL                                                 OR                                        > 80% decrease in PCT                                      Discontinue / Do not initiate                                             antibiotics  Performed at [facility], [address] [place], [place] [address]    Campylobacter species 03/02/2022 NOT DETECTED  NOT DETECTED Final   Plesimonas shigelloides 03/02/2022 NOT DETECTED  NOT DETECTED Final   Salmonella  species 03/02/2022 NOT DETECTED  NOT DETECTED Final   Yersinia enterocolitica 03/02/2022 NOT DETECTED  NOT DETECTED Final   Vibrio species 03/02/2022 NOT DETECTED  NOT DETECTED Final   Vibrio cholerae 03/02/2022 NOT DETECTED  NOT DETECTED Final   Enteroaggregative E coli (EAEC) 03/02/2022 NOT DETECTED  NOT DETECTED Final   Enteropathogenic E coli (EPEC) 03/02/2022 NOT DETECTED  NOT DETECTED Final   Enterotoxigenic E coli (ETEC) 03/02/2022 NOT DETECTED  NOT DETECTED Final   Shiga like toxin producing E coli * 03/02/2022 NOT DETECTED  NOT DETECTED Final   Shigella/Enteroinvasive E coli (EI* 03/02/2022 NOT DETECTED  NOT DETECTED Final   Cryptosporidium 03/02/2022 DETECTED (A)  NOT DETECTED Final   Cyclospora cayetanensis 03/02/2022 NOT DETECTED  NOT DETECTED Final   Entamoeba histolytica 03/02/2022 NOT DETECTED  NOT DETECTED Final   Giardia lamblia 03/02/2022 NOT DETECTED  NOT DETECTED Final   Adenovirus F40/41 03/02/2022 NOT DETECTED  NOT DETECTED Final   Astrovirus 03/02/2022 NOT DETECTED  NOT DETECTED Final   Norovirus GI/GII 03/02/2022 NOT DETECTED  NOT DETECTED Final   Rotavirus A 03/02/2022 NOT  DETECTED  NOT DETECTED Final   Sapovirus (I, II, IV, and V) 03/02/2022 NOT DETECTED  NOT DETECTED Final   Performed at [facility] [address] [place], [place] [address]  Admission on 12/17/2021, Discharged on 12/17/2021  Component Date Value Ref Range Status   Sodium 12/17/2021 138  135 - 145 mmol/L Final   Potassium 12/17/2021 3.3 (L)  3.5 - 5.1 mmol/L Final   Chloride 12/17/2021 109  98 - 111 mmol/L Final   CO2 12/17/2021 21 (L)  22 - 32 mmol/L Final   Glucose, Bld 12/17/2021 122 (H)  70 - 99 mg/dL Final   Glucose reference range applies only to samples taken after fasting for at least 8 hours.   BUN 12/17/2021 5 (L)  6 - 20 mg/dL Final   Creatinine, Ser 12/17/2021 0.59  0.44 - 1.00 mg/dL Final   Calcium 60/45/4098 8.8 (L)  8.9 - 10.3 mg/dL Final   GFR, Estimated 12/17/2021 >60  >60 mL/min Final   Comment: (NOTE) Calculated using the CKD-EPI Creatinine Equation (2021)    Anion gap 12/17/2021 8  5 - 15 Final   Performed at [facility], [address] [place], [place] [address]   WBC 12/17/2021 8.4  4.0 - 10.5 K/uL Final   RBC 12/17/2021 4.34  3.87 - 5.11 MIL/uL Final   Hemoglobin 12/17/2021 12.9  12.0 - 15.0 g/dL Final   HCT 78/29/5621 38.7  36.0 - 46.0 % Final   MCV 12/17/2021 89.2  80.0 - 100.0 fL Final   MCH 12/17/2021 29.7  26.0 - 34.0 pg Final   MCHC 12/17/2021 33.3  30.0 - 36.0 g/dL Final   RDW 30/86/5784 13.0  11.5 - 15.5 % Final   Platelets 12/17/2021 303  150 - 400 K/uL Final   nRBC 12/17/2021 0.0  0.0 - 0.2 % Final   Performed at [facility], [address] [place], [place] [address]   Troponin I (High Sensitivity) 12/17/2021 <2  <18 ng/L Final   Comment: (NOTE) Elevated high sensitivity troponin I (hsTnI) values and significant  changes across serial measurements may suggest ACS but many other  chronic and acute conditions are known to elevate hsTnI results.  Refer to the "Links" section for chest pain algorithms and additional   guidance. Performed at [facility], [address] [place], [place] [address]    D-Dimer,  Quant 12/17/2021 0.50  0.00 - 0.50 ug/mL-FEU Final   Comment: (NOTE) At the manufacturer cut-off value of 0.5 g/mL FEU, this assay has a negative predictive value of 95-100%.This assay is intended for use in conjunction with a clinical pretest probability (PTP) assessment model to exclude pulmonary embolism (PE) and deep venous thrombosis (DVT) in outpatients suspected of PE or DVT. Results should be correlated with clinical presentation. Performed at [facility], [address] [place], [place] [address]    SARS Coronavirus 2 by RT PCR 12/17/2021 NEGATIVE  NEGATIVE Final   Comment: (NOTE) SARS-CoV-2 target nucleic acids are NOT DETECTED.  The SARS-CoV-2 RNA is generally detectable in upper respiratory specimens during the acute phase of infection. The lowest concentration of SARS-CoV-2 viral copies this assay can detect is 138 copies/mL. A negative result does not preclude SARS-Cov-2 infection and should not be used as the sole basis for treatment or other patient management decisions. A negative result may occur with  improper specimen collection/handling, submission of specimen other than nasopharyngeal swab, presence of viral mutation(s) within the areas targeted by this assay, and inadequate number of viral copies(<138 copies/mL). A negative result must be combined with clinical observations, patient history, and epidemiological information. The expected result is Negative.  Fact Sheet for Patients:  BloggerCourse.com  Fact Sheet for Healthcare Providers:  SeriousBroker.it  This test is no                          t yet approved or cleared by the [place] FDA and  has been authorized for detection and/or diagnosis of SARS-CoV-2 by FDA under an Emergency Use Authorization (EUA). This EUA will remain  in  effect (meaning this test can be used) for the duration of the COVID-19 declaration under Section 564(b)(1) of the Act, 21 U.S.C.section 360bbb-3(b)(1), unless the authorization is terminated  or revoked sooner.       Influenza A by PCR 12/17/2021 NEGATIVE  NEGATIVE Final   Influenza B by PCR 12/17/2021 NEGATIVE  NEGATIVE Final   Comment: (NOTE) The Xpert Xpress SARS-CoV-2/FLU/RSV plus assay is intended as an aid in the diagnosis of influenza from Nasopharyngeal swab specimens and should not be used as a sole basis for treatment. Nasal washings and aspirates are unacceptable for Xpert Xpress SARS-CoV-2/FLU/RSV testing.  Fact Sheet for Patients: BloggerCourse.com  Fact Sheet for Healthcare Providers: SeriousBroker.it  This test is not yet approved or cleared by the [place] FDA and has been authorized for detection and/or diagnosis of SARS-CoV-2 by FDA under an Emergency Use Authorization (EUA). This EUA will remain in effect (meaning this test can be used) for the duration of the COVID-19 declaration under Section 564(b)(1) of the Act, 21 U.S.C. section 360bbb-3(b)(1), unless the authorization is terminated or revoked.  Performed at [facility], [address] [place], [place] [address]    hCG, Clement Sayres, Kathie Rhodes 12/17/2021 <1  <5 mIU/mL Final   Comment:          GEST. AGE      CONC.  (mIU/mL)   <=1 WEEK        5 - 50     2 WEEKS       50 - 500     3 WEEKS       100 - 10,000     4 WEEKS     1,000 - 30,000     5 WEEKS     3,500 -  115,000   6-8 WEEKS     12,000 - 270,000    12 WEEKS     15,000 - 220,000        FEMALE AND NON-PREGNANT FEMALE:     LESS THAN 5 mIU/mL Performed at [facility], [address] [place], [place] [address]   Admission on 11/24/2021, Discharged on 11/25/2021  Component Date Value Ref Range Status   Sodium 11/24/2021 139  135 - 145 mmol/L Final   Potassium 11/24/2021  3.6  3.5 - 5.1 mmol/L Final   Chloride 11/24/2021 111  98 - 111 mmol/L Final   CO2 11/24/2021 22  22 - 32 mmol/L Final   Glucose, Bld 11/24/2021 101 (H)  70 - 99 mg/dL Final   Glucose reference range applies only to samples taken after fasting for at least 8 hours.   BUN 11/24/2021 10  6 - 20 mg/dL Final   Creatinine, Ser 11/24/2021 0.55  0.44 - 1.00 mg/dL Final   Calcium 60/45/4098 8.5 (L)  8.9 - 10.3 mg/dL Final   GFR, Estimated 11/24/2021 >60  >60 mL/min Final   Comment: (NOTE) Calculated using the CKD-EPI Creatinine Equation (2021)    Anion gap 11/24/2021 6  5 - 15 Final   Performed at [facility], [address] [place], [place] [address]   WBC 11/24/2021 11.2 (H)  4.0 - 10.5 K/uL Final   RBC 11/24/2021 3.94  3.87 - 5.11 MIL/uL Final   Hemoglobin 11/24/2021 11.9 (L)  12.0 - 15.0 g/dL Final   HCT 78/29/5621 35.1 (L)  36.0 - 46.0 % Final   MCV 11/24/2021 89.1  80.0 - 100.0 fL Final   MCH 11/24/2021 30.2  26.0 - 34.0 pg Final   MCHC 11/24/2021 33.9  30.0 - 36.0 g/dL Final   RDW 30/86/5784 12.8  11.5 - 15.5 % Final   Platelets 11/24/2021 313  150 - 400 K/uL Final   nRBC 11/24/2021 0.0  0.0 - 0.2 % Final   Performed at [facility], [address] [place], [place] [address]   Troponin I (High Sensitivity) 11/24/2021 233 (HH)  <18 ng/L Final   Comment: CRITICAL RESULT CALLED TO, READ BACK BY AND VERIFIED WITH VET HOOKER RN [id number] 11/24/21 HNM (NOTE) Elevated high sensitivity troponin I (hsTnI) values and significant  changes across serial measurements may suggest ACS but many other  chronic and acute conditions are known to elevate hsTnI results.  Refer to the "Links" section for chest pain algorithms and additional  guidance. Performed at [facility], [address] [place], [place] [address]    Hgb A1c MFr Bld 11/24/2021 4.8  4.8 - 5.6 % Final   Comment: (NOTE) Pre diabetes:          5.7%-6.4%  Diabetes:              >6.4%  Glycemic control  for   <7.0% adults with diabetes    Mean Plasma Glucose 11/24/2021 91.06  mg/dL Final   Performed at [facility], [address] [place], [place] [address]   HIV Screen 4th Generation wRfx 11/25/2021 Non Reactive  Non Reactive Final   Performed at [facility], [address], [place], [place] [address]   Weight 11/24/2021 3,040  oz Final   Height 11/24/2021 70  in Final   BP 11/24/2021 90/63  mmHg Final   Ao pk vel 11/24/2021 1.46  m/s Final   AV Area VTI 11/24/2021 2.08  cm2  Final   AR max vel 11/24/2021 2.11  cm2 Final   AV Mean grad 11/24/2021 4.0  mmHg Final   AV Peak grad 11/24/2021 8.5  mmHg Final   S' Lateral 11/24/2021 3.10  cm Final   AV Area mean vel 11/24/2021 2.29  cm2 Final   Area-P 1/2 11/24/2021 4.63  cm2 Final   B Natriuretic Peptide 11/24/2021 239.9 (H)  0.0 - 100.0 pg/mL Final   Performed at [facility], [address] [place], [place] [address]   Troponin I (High Sensitivity) 11/24/2021 117 (HH)  <18 ng/L Final   Comment: CRITICAL VALUE NOTED. VALUE IS CONSISTENT WITH PREVIOUSLY REPORTED/CALLED VALUE SKL (NOTE) Elevated high sensitivity troponin I (hsTnI) values and significant  changes across serial measurements may suggest ACS but many other  chronic and acute conditions are known to elevate hsTnI results.  Refer to the "Links" section for chest pain algorithms and additional  guidance. Performed at [facility], [address] [place], [place] [address]    Cholesterol 11/25/2021 131  0 - 200 mg/dL Final   Triglycerides 09/81/1914 110  <150 mg/dL Final   HDL 78/29/5621 33 (L)  >40 mg/dL Final   Total CHOL/HDL Ratio 11/25/2021 4.0  RATIO Final   VLDL 11/25/2021 22  0 - 40 mg/dL Final   LDL Cholesterol 11/25/2021 76  0 - 99 mg/dL Final   Comment:        Total Cholesterol/HDL:CHD Risk Coronary Heart Disease Risk Table                     Men   Women  1/2 Average Risk   3.4   3.3  Average Risk       5.0   4.4  2 X  Average Risk   9.6   7.1  3 X Average Risk  23.4   11.0        Use the calculated Patient Ratio above and the CHD Risk Table to determine the patient's CHD Risk.        ATP III CLASSIFICATION (LDL):  <100     mg/dL   Optimal  308-657  mg/dL   Near or Above                    Optimal  130-159  mg/dL   Borderline  846-962  mg/dL   High  >952     mg/dL   Very High Performed at [facility], [address] [place], [place] [address]    Sodium 11/25/2021 139  135 - 145 mmol/L Final   Potassium 11/25/2021 3.3 (L)  3.5 - 5.1 mmol/L Final   Chloride 11/25/2021 111  98 - 111 mmol/L Final   CO2 11/25/2021 23  22 - 32 mmol/L Final   Glucose, Bld 11/25/2021 101 (H)  70 - 99 mg/dL Final   Glucose reference range applies only to samples taken after fasting for at least 8 hours.   BUN 11/25/2021 10  6 - 20 mg/dL Final   Creatinine, Ser 11/25/2021 0.63  0.44 - 1.00 mg/dL Final   Calcium 44/01/270 8.3 (L)  8.9 - 10.3 mg/dL Final   GFR, Estimated 11/25/2021 >60  >60 mL/min Final   Comment: (NOTE) Calculated using the CKD-EPI Creatinine Equation (2021)    Anion gap 11/25/2021 5  5 - 15 Final   Performed at [facility], [address] [place], [place] [address]   WBC 11/25/2021 7.6  4.0 -  10.5 K/uL Final   RBC 11/25/2021 3.70 (L)  3.87 - 5.11 MIL/uL Final   Hemoglobin 11/25/2021 11.0 (L)  12.0 - 15.0 g/dL Final   HCT 16/10/9602 33.1 (L)  36.0 - 46.0 % Final   MCV 11/25/2021 89.5  80.0 - 100.0 fL Final   MCH 11/25/2021 29.7  26.0 - 34.0 pg Final   MCHC 11/25/2021 33.2  30.0 - 36.0 g/dL Final   RDW 54/09/8117 12.9  11.5 - 15.5 % Final   Platelets 11/25/2021 289  150 - 400 K/uL Final   nRBC 11/25/2021 0.0  0.0 - 0.2 % Final   Performed at [facility], [address] [place], [place] [address]   Troponin I (High Sensitivity) 11/24/2021 110 (HH)  <18 ng/L Final   Comment: CRITICAL VALUE NOTED. VALUE IS CONSISTENT WITH PREVIOUSLY REPORTED/CALLED VALUE  SKL (NOTE) Elevated high sensitivity troponin I (hsTnI) values and significant  changes across serial measurements may suggest ACS but many other  chronic and acute conditions are known to elevate hsTnI results.  Refer to the "Links" section for chest pain algorithms and additional  guidance. Performed at [facility], [address] [place], [place] [address]    Troponin I (High Sensitivity) 11/25/2021 97 (H)  <18 ng/L Final   Comment: (NOTE) Elevated high sensitivity troponin I (hsTnI) values and significant  changes across serial measurements may suggest ACS but many other  chronic and acute conditions are known to elevate hsTnI results.  Refer to the "Links" section for chest pain algorithms and additional  guidance. Performed at [facility], [address] [place], [place] [address]    Glucose-Capillary 11/25/2021 106 (H)  70 - 99 mg/dL Final   Glucose reference range applies only to samples taken after fasting for at least 8 hours.    Allergies: Latex, Penicillins, and Nsaids  Medications:  PTA Medications  Medication Sig   calcium-vitamin D (OSCAL WITH D) 500-5 MG-MCG tablet Take 2 tablets by mouth daily with breakfast.   pantoprazole (PROTONIX) 40 MG tablet Take 1 tablet (40 mg total) by mouth daily.   folic acid (FOLVITE) 1 MG tablet Take 1 tablet (1 mg total) by mouth daily.   Multiple Vitamin (MULTIVITAMIN WITH MINERALS) TABS tablet Take 1 tablet by mouth daily.   thiamine (VITAMIN B-1) 100 MG tablet Take 1 tablet (100 mg total) by mouth daily.   nicotine (NICODERM CQ - DOSED IN MG/24 HOURS) 21 mg/24hr patch Place 1 patch (21 mg total) onto the skin daily.   albuterol (VENTOLIN HFA) 108 (90 Base) MCG/ACT inhaler Inhale 2 puffs into the lungs every 4 (four) hours as needed for wheezing or shortness of breath.   BIOTIN PO Take 1 tablet by mouth daily.   traZODone (DESYREL) 50 MG tablet Take 0.5 tablets (25 mg total) by mouth at bedtime as needed  for Duffy.    Long Term Goals: Improvement in symptoms so as ready for discharge  Short Term Goals: Patient will verbalize feelings in meetings with treatment team members., Patient will attend at least of 50% of the groups daily., Pt will complete the PHQ9 on admission, day 3 and discharge., Patient will participate in completing the [place] Suicide Severity Rating Scale, Patient will score a low risk of violence for 24 hours prior to discharge, and Patient will take medications as prescribed daily.  Medical Decision Making  Rebecca Duffy was admitted to [facility] crisis unit under the service of No att.  providers found for Substance induced mood disorder (HCC), crisis management, and stabilization. Routine labs ordered, which include Lab Orders         SARS Coronavirus 2 by RT PCR (hospital order, performed in [facility] hospital lab) *cepheid single result test*         CBC with Differential/Platelet         Comprehensive metabolic panel         Hemoglobin A1c         Magnesium         Ethanol         Lipid panel         Prolactin         Urinalysis, Routine w reflex microscopic -Urine, Clean Catch         TSH         POC urine preg, ED         POCT Urine Drug Screen - (I-Screen)         POC SARS Coronavirus 2 Ag    Medication Management: Medications started Meds ordered this encounter  Medications   DISCONTD: acetaminophen (TYLENOL) tablet 650 mg   DISCONTD: alum & mag hydroxide-simeth (MAALOX/MYLANTA) 200-200-20 MG/5ML suspension 30 mL   DISCONTD: magnesium hydroxide (MILK OF MAGNESIA) suspension 30 mL   DISCONTD: traZODone (DESYREL) tablet 50 mg   DISCONTD: thiamine (VITAMIN B1) injection 100 mg   DISCONTD: multivitamin with minerals tablet 1 tablet   DISCONTD: chlordiazePOXIDE (LIBRIUM) capsule 25 mg   DISCONTD: hydrOXYzine (ATARAX) tablet 25 mg   DISCONTD: loperamide (IMODIUM) capsule 2-4 mg   DISCONTD: ondansetron  (ZOFRAN-ODT) disintegrating tablet 4 mg   DISCONTD: pantoprazole (PROTONIX) EC tablet 40 mg   DISCONTD: nicotine (NICODERM CQ - dosed in mg/24 hours) patch 21 mg   DISCONTD: traZODone (DESYREL) 50 MG tablet    Sig: Take 1 tablet (50 mg total) by mouth at bedtime as needed for Duffy.    Dispense:  30 tablet    Refill:  0    Order Specific Question:   Supervising Provider    Answer:   Nelly Rout [[id number]]   DISCONTD: traZODone (DESYREL) tablet 25 mg   traZODone (DESYREL) 50 MG tablet    Sig: Take 0.5 tablets (25 mg total) by mouth at bedtime as needed for Duffy.    Dispense:  15 tablet    Refill:  0    Order Specific Question:   Supervising Provider    Answer:   Nelly Rout [[id number]]  Will maintain observation checks every 15 minutes for safety. Psychosocial education regarding relapse prevention and self-care; social and communication  Social work will consult patient for discharge and follow up plan. Child Protective Service report filed related to mother being out of home with a 12 yr child and 16 yr now having to raise child while patient out of home related to substance abuse    Recommendations  Based on my evaluation the patient does not appear to have an emergency medical condition.  Tedric Leeth, NP 05/19/22  6:29 PM

## 2022-05-09 NOTE — ED Notes (Addendum)
Patient states that she is hot temp is 98.7. Will continue to monitor.States that the sharp pain has stop. But that her head is still hurting . Rn notified provider.
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
## 2022-05-09 NOTE — ED Notes (Signed)
Patient with no sxs of distress noted - will continue to monitor for safety 

## 2022-05-09 NOTE — ED Notes (Signed)
Patient in milieu. Environment is secured. Will continue to monitor for safety. 

## 2022-05-09 NOTE — ED Notes (Signed)
Pt refused IM thiamine injection.  Provider notified.

## 2022-05-09 NOTE — ED Notes (Signed)
Patient alert and oriented x 3. Denies SI/HI/AVH. Denies intent or plan to harm self or others. Routine conducted according to faculty protocol. Encourage patient to notify staff with any needs or concerns. Patient verbalized agreement and understanding. Will continue to monitor for safety. 

## 2022-05-09 NOTE — ED Notes (Signed)
Patient sates that her rt side of her head was hurting . Notified provider. Provider accessed the patient . States to give tylenol and will reassess to see if further action is needed.Patient vital were 127/91 bp pulse 100 and oxygen 98% room air. Patient is able to move all limbs,She has clear speech ,no facial drooping, Denies any numbness or tingling in her extremities.pupil are  3mm  bilaterally

## 2022-05-09 NOTE — Progress Notes (Signed)
Per Nursing, patient c/o of head pain above her right ear. Patient evaluated by this provider. Patient reports sharp shooting pain to the right lateral temporal region of her head. She states that the pain just started. She denies a past history of experiencing pain in that area. She reports pain only to the site when touched. She reports seeing floaters when pushing on that particular area. She reports a history of migraines. She reports a migraine last night. She reports partying hard since Thursday because it was her birthday (using alcohol and cocaine). Patient examined, no bruising, no edema, no redness, no lumps, or asymmetry noted. Patient denies nausea, vomiting, or blurred vision. Vital signs are stable. Patient able to move upper and lower extremities without difficulty, no facial drooping or slurred speech noted. Will offer tylenol po for discomfort. Nursing will continue to monitor and report any new changes to NP.

## 2022-05-09 NOTE — ED Notes (Signed)
CPS report made to [facility], spoke with Shon Baton, she can be reached at [phone].

## 2022-05-09 NOTE — ED Notes (Signed)
Ac states to keep patient on obs  until [facility] is able to take patient.

## 2022-05-09 NOTE — ED Notes (Addendum)
Pt c/o R above the ear scalp pain.  Denies trauma, injury or bug bite.  Pt complaining of " seeing floaters when I push on it".  Patrice NP notified.  Pt is laughing and joking while being assessed.   PERRL ,  denies N/V   Provider in to eval.

## 2022-05-09 NOTE — ED Notes (Signed)
Pt admitted to obs until [facility] can take her. Denies SI/HI/AVH. Calm, cooperative throughout interview process. Skin assessment completed. Oriented to unit. Meal and drink offered. At currrent, pt continue to denies  SI/HI/AVH. Pt verbally contract for safety. Will monitor for safety. Patient belonging in locker 13 vape shoes blue bag

## 2022-05-10 ENCOUNTER — Encounter (HOSPITAL_COMMUNITY): Payer: Self-pay | Admitting: Registered Nurse

## 2022-05-10 DIAGNOSIS — F1020 Alcohol dependence, uncomplicated: Secondary | ICD-10-CM | POA: Diagnosis not present

## 2022-05-10 DIAGNOSIS — F1220 Cannabis dependence, uncomplicated: Secondary | ICD-10-CM | POA: Diagnosis not present

## 2022-05-10 DIAGNOSIS — Z1152 Encounter for screening for COVID-19: Secondary | ICD-10-CM | POA: Diagnosis not present

## 2022-05-10 DIAGNOSIS — F1919 Other psychoactive substance abuse with unspecified psychoactive substance-induced disorder: Secondary | ICD-10-CM | POA: Diagnosis not present

## 2022-05-10 NOTE — ED Notes (Signed)
Patient was provided with breakfast 

## 2022-05-10 NOTE — ED Notes (Signed)
Pt observed/assessed in room sleeping. RR even and unlabored, appearing in no noted distress. Environmental check complete, will continue to monitor for safety 

## 2022-05-10 NOTE — ED Notes (Signed)
Patient denies complaints at this time - no sxs of distress noted - will continue to monitor for safety

## 2022-05-10 NOTE — ED Notes (Signed)
Pt admitted to [facility]. A/o, anxious and med focused. Rapid speech and disorganized at times. Sweating and twitching,rubbing nose. Bedtime meds provided. Pt is educated about unit and expectations to attend groups and compliance with meds. She voices understanding. HS meds provided. No noted distress. Will continue to monitor for safety

## 2022-05-10 NOTE — ED Notes (Signed)
Patient resting with no sxs of distress noted - will continue to monitor for safety 

## 2022-05-10 NOTE — Group Note (Signed)
Group Topic: Wellness  Group Date: 05/10/2022 Start Time: 0930 End Time: 1015 Facilitators: Prentice Docker, RN  Department: [facility]  Number of Participants: 6  Group Focus: other medication education Treatment Modality:  Psychoeducation Interventions utilized were patient education Purpose: increase insight  Name: Shelli Portilla Date of Birth: [birth year]  MR: [id number]    Level of Participation: active Quality of Participation: attentive and cooperative Interactions with others: listen attentively to medication indication Mood/Affect: appropriate Triggers (if applicable): n/a Cognition: coherent/clear and logical Progress: Gaining insight Response: verbalized understanding of medication indication Plan: patient will be encouraged to continue medication compliance  Patients Problems:  Patient Active Problem List   Diagnosis Date Noted   Polysubstance abuse 05/09/2022   Substance induced mood disorder 05/09/2022   Cocaine use disorder, moderate, dependence 05/09/2022   Passive suicidal ideations 05/09/2022   Chest pain 11/24/2021   Leukocytosis 11/24/2021   NSTEMI (non-ST elevated myocardial infarction) 11/24/2021   Tobacco abuse 11/24/2021   Gastric ulcer 11/24/2021   Alcohol use 11/24/2021   Gastroenteritis 11/24/2021   Bipolar I disorder, most recent episode depressed    Alcohol use disorder, severe, dependence    Cannabis use disorder, severe, dependence

## 2022-05-10 NOTE — ED Notes (Signed)
Patient A&Ox4. Denies intent to harm self/others when asked. Denies A/VH. Patient denies any physical complaints when asked. Pt anxious, hyperactive during assessment. Cooperative and pleasant with staff. Pt states, "I have to discharge before Tuesday. I have to get back to work. I'm gonna start going back to my AA meetings after this. I'm done. I have a great job and I don't want to lose it". Support and encouragement provided. Routine safety checks conducted according to facility protocol. Encouraged patient to notify staff if thoughts of harm toward self or others arise. Patient verbalize understanding and agreement. Will continue to monitor for safety.

## 2022-05-10 NOTE — ED Provider Notes (Signed)
Behavioral Health Progress Note  Date and Time: 05/10/2022 3:14 PM Name: Rebecca Duffy MRN:  [id number]  Subjective:   Rebecca Duffy is a 42 yr old female who presented on 4/20 to [facility] requesting Detox, she was admitted to [facility] on 4/20.  PPHx is significant for Bipolar disorder, PTSD, Polysubstance Abuse (cocaine, marijuana, alcohol, and opiates), and ADHD.  She reports that she is having some significant withdrawal symptoms today.  She reports having sweats, chills, shakes, headache, and diarrhea.  Discussed with her that she does have as needed detox medications ordered and she reported understanding.  She reports no cravings.  Discussed what she would like to do after she completes her detox and she reports that she plans to discharge her for Tuesday.  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  She reports she is here only to detox and then plans to leave and do AA meetings every other day and will go with her oldest daughter.  She reports no SI, HI, or AVH.  She reports her sleep was poor last night due to withdrawal symptoms.  She reports her appetite is good.  She reports no other concerns at present.  Diagnosis:  Final diagnoses:  Substance induced mood disorder  Polysubstance abuse  Cocaine use disorder, moderate, dependence  Cannabis use disorder, severe, dependence  Alcohol use disorder, severe, dependence  Bipolar I disorder, most recent episode depressed  Passive suicidal ideations    Total Time spent with patient: 30 minutes  Past Psychiatric History: Bipolar disorder, PTSD, Polysubstance Abuse (cocaine, marijuana, alcohol, and opiates), and ADHD. 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  Additional Social History:     Pain Medications: see MAR Prescriptions: see MAR Over the Counter: see MAR History of alcohol / drug use?: Yes Longest period of sobriety (when/how long): none reported Negative Consequences of Use: Financial, Personal relationships, Work / School Withdrawal Symptoms: Agitation, Aggressive/Assaultive, Seizures Onset of Seizures: UTA Date of most recent seizure: Pt reports seizures ten years ago Name of Substance 1: Alcohol 1 - Age of First Use: 13 1 - Amount (size/oz): 4 loco, beer, whisky 1 - Frequency: daily 1 - Duration: ongoing 1 - Last Use / Amount: 05/09/22 1 - Method of Aquiring: UTA 1- Route of Use: drinking Name of Substance 2: marijuana 2 - Age of First Use: 13 2 - Amount (size/oz): blunt 2 - Frequency: daily 2 - Duration: ongoing 2 - Last Use / Amount: 05/09/22 2 - Method of Aquiring: UTA 2 - Route of Substance Use: Smoking Name of Substance 3: cocaine 3 - Age of First Use: 21 3 - Amount (size/oz): 3 grams 3 - Frequency: once every three weeks 3 - Duration: ongoing 3 - Last Use / Amount: 05/09/22 3 - Method of Aquiring: UTA 3 - Route of Substance Use: smoking              Sleep: Poor due to withdrawal symptoms  Appetite:  Good  Current Medications:  Current Facility-Administered Medications  Medication Dose Route Frequency Provider Last Rate Last Admin   acetaminophen (TYLENOL) tablet 650 mg  650 mg Oral Q6H PRN Rankin, Shuvon B, NP   650 mg at 05/09/22 1743   alum & mag hydroxide-simeth (MAALOX/MYLANTA) 200-200-20 MG/5ML suspension 30  mL  30 mL Oral Q4H PRN Rankin, Shuvon B, NP       chlordiazePOXIDE (LIBRIUM) capsule 25 mg  25 mg Oral Q6H PRN Rankin, Shuvon B, NP       hydrOXYzine (ATARAX) tablet 25 mg  25 mg Oral Q6H PRN Rankin, Shuvon B, NP   25 mg at 05/10/22 0347   loperamide (IMODIUM) capsule 2-4 mg  2-4 mg Oral PRN Rankin, Shuvon B, NP       magnesium hydroxide (MILK OF MAGNESIA) suspension 30 mL  30 mL Oral Daily PRN Rankin, Shuvon B, NP        multivitamin with minerals tablet 1 tablet  1 tablet Oral Daily Rankin, Shuvon B, NP   1 tablet at 05/10/22 0918   nicotine (NICODERM CQ - dosed in mg/24 hours) patch 21 mg  21 mg Transdermal Daily Rankin, Shuvon B, NP   21 mg at 05/10/22 0919   ondansetron (ZOFRAN-ODT) disintegrating tablet 4 mg  4 mg Oral Q6H PRN Rankin, Shuvon B, NP       pantoprazole (PROTONIX) EC tablet 40 mg  40 mg Oral Daily Rankin, Shuvon B, NP   40 mg at 05/10/22 4259   thiamine (VITAMIN B1) injection 100 mg  100 mg Intramuscular Once Rankin, Shuvon B, NP       traZODone (DESYREL) tablet 50 mg  50 mg Oral QHS PRN Rankin, Shuvon B, NP   50 mg at 05/09/22 2227   Current Outpatient Medications  Medication Sig Dispense Refill   albuterol (VENTOLIN HFA) 108 (90 Base) MCG/ACT inhaler Inhale 2 puffs into the lungs every 4 (four) hours as needed for wheezing or shortness of breath. 8 g 0   BIOTIN PO Take 1 tablet by mouth daily.     calcium-vitamin D (OSCAL WITH D) 500-5 MG-MCG tablet Take 2 tablets by mouth daily with breakfast.     folic acid (FOLVITE) 1 MG tablet Take 1 tablet (1 mg total) by mouth daily. 90 tablet 1   Multiple Vitamin (MULTIVITAMIN WITH MINERALS) TABS tablet Take 1 tablet by mouth daily. 90 tablet 1   nicotine (NICODERM CQ - DOSED IN MG/24 HOURS) 21 mg/24hr patch Place 1 patch (21 mg total) onto the skin daily. 28 patch 0   pantoprazole (PROTONIX) 40 MG tablet Take 1 tablet (40 mg total) by mouth daily. 30 tablet 1   thiamine (VITAMIN B-1) 100 MG tablet Take 1 tablet (100 mg total) by mouth daily. 90 tablet 1    Labs  Lab Results:  Admission on 05/09/2022  Component Date Value Ref Range Status   WBC 05/09/2022 9.4  4.0 - 10.5 K/uL Final   RBC 05/09/2022 4.38  3.87 - 5.11 MIL/uL Final   Hemoglobin 05/09/2022 13.1  12.0 - 15.0 g/dL Final   HCT 56/38/7564 39.6  36.0 - 46.0 % Final   MCV 05/09/2022 90.4  80.0 - 100.0 fL Final   MCH 05/09/2022 29.9  26.0 - 34.0 pg Final   MCHC 05/09/2022 33.1  30.0 -  36.0 g/dL Final   RDW 33/29/5188 12.8  11.5 - 15.5 % Final   Platelets 05/09/2022 469 (H)  150 - 400 K/uL Final   nRBC 05/09/2022 0.0  0.0 - 0.2 % Final   Neutrophils Relative % 05/09/2022 52  % Final   Neutro Abs 05/09/2022 4.9  1.7 - 7.7 K/uL Final   Lymphocytes Relative 05/09/2022 32  % Final   Lymphs Abs 05/09/2022 3.0  0.7 - 4.0 K/uL Final  Monocytes Relative 05/09/2022 9  % Final   Monocytes Absolute 05/09/2022 0.8  0.1 - 1.0 K/uL Final   Eosinophils Relative 05/09/2022 6  % Final   Eosinophils Absolute 05/09/2022 0.6 (H)  0.0 - 0.5 K/uL Final   Basophils Relative 05/09/2022 1  % Final   Basophils Absolute 05/09/2022 0.1  0.0 - 0.1 K/uL Final   Immature Granulocytes 05/09/2022 0  % Final   Abs Immature Granulocytes 05/09/2022 0.04  0.00 - 0.07 K/uL Final   Performed at [facility], [address], [place], [place] [address]   Sodium 05/09/2022 137  135 - 145 mmol/L Final   Potassium 05/09/2022 3.9  3.5 - 5.1 mmol/L Final   Chloride 05/09/2022 101  98 - 111 mmol/L Final   CO2 05/09/2022 21 (L)  22 - 32 mmol/L Final   Glucose, Bld 05/09/2022 127 (H)  70 - 99 mg/dL Final   Glucose reference range applies only to samples taken after fasting for at least 8 hours.   BUN 05/09/2022 9  6 - 20 mg/dL Final   Creatinine, Ser 05/09/2022 0.81  0.44 - 1.00 mg/dL Final   Calcium 19/14/7829 9.8  8.9 - 10.3 mg/dL Final   Total Protein 56/21/3086 6.5  6.5 - 8.1 g/dL Final   Albumin 57/84/6962 4.1  3.5 - 5.0 g/dL Final   AST 95/28/4132 17  15 - 41 U/L Final   ALT 05/09/2022 18  0 - 44 U/L Final   Alkaline Phosphatase 05/09/2022 82  38 - 126 U/L Final   Total Bilirubin 05/09/2022 1.2  0.3 - 1.2 mg/dL Final   GFR, Estimated 05/09/2022 >60  >60 mL/min Final   Comment: (NOTE) Calculated using the CKD-EPI Creatinine Equation (2021)    Anion gap 05/09/2022 15  5 - 15 Final   Performed at [facility], [address] [place], [place] [address]   Hgb A1c MFr Bld 05/09/2022 5.1  4.8 -  5.6 % Final   Comment: (NOTE) Pre diabetes:          5.7%-6.4%  Diabetes:              >6.4%  Glycemic control for   <7.0% adults with diabetes    Mean Plasma Glucose 05/09/2022 99.67  mg/dL Final   Performed at [facility], [address], [place], [place] [address]   Magnesium 05/09/2022 2.3  1.7 - 2.4 mg/dL Final   Performed at [facility], [address], [place], [place] [address]   Alcohol, Ethyl (B) 05/09/2022 34 (H)  <10 mg/dL Final   Comment: (NOTE) Lowest detectable limit for serum alcohol is 10 mg/dL.  For medical purposes only. Performed at  [facility], [address] [place] [address] [place], [place] [address]    Cholesterol 05/09/2022 247 (H)  0 - 200 mg/dL Final   Triglycerides 59/56/3875 233 (H)  <150 mg/dL Final   HDL 64/33/2951 69  >40 mg/dL Final   Total CHOL/HDL Ratio 05/09/2022 3.6  RATIO Final   VLDL 05/09/2022 47 (H)  0 - 40 mg/dL Final   LDL Cholesterol 05/09/2022 131 (H)  0 - 99 mg/dL Final   Comment:        Total Cholesterol/HDL:CHD Risk Coronary Heart Disease Risk Table                     Men   Women  1/2 Average Risk   3.4   3.3  Average Risk  5.0   4.4  2 X Average Risk   9.6   7.1  3 X Average Risk  23.4   11.0        Use the calculated Patient Ratio above and the CHD Risk Table to determine the patient's CHD Risk.        ATP III CLASSIFICATION (LDL):  <100     mg/dL   Optimal  161-096  mg/dL   Near or Above                    Optimal  130-159  mg/dL   Borderline  045-409  mg/dL   High  >811     mg/dL   Very High Performed at [facility], [address] [place], [place] [address]    Color, Urine 05/09/2022 YELLOW  YELLOW Final   APPearance 05/09/2022 CLEAR  CLEAR Final   Specific Gravity, Urine 05/09/2022 1.010  1.005 - 1.030 Final   pH 05/09/2022 5.0  5.0 - 8.0 Final   Glucose, UA 05/09/2022 NEGATIVE  NEGATIVE mg/dL Final   Hgb urine dipstick 05/09/2022 NEGATIVE  NEGATIVE Final   Bilirubin Urine 05/09/2022  NEGATIVE  NEGATIVE Final   Ketones, ur 05/09/2022 NEGATIVE  NEGATIVE mg/dL Final   Protein, ur 29/56/2130 NEGATIVE  NEGATIVE mg/dL Final   Nitrite 86/57/8469 NEGATIVE  NEGATIVE Final   Leukocytes,Ua 05/09/2022 NEGATIVE  NEGATIVE Final   Performed at [facility], [address] [place], [place] [address]   Preg Test, Ur 05/09/2022 Negative  Negative Final   POC Amphetamine UR 05/09/2022 None Detected  NONE DETECTED (Cut Off Level 1000 ng/mL) Final   POC Secobarbital (BAR) 05/09/2022 None Detected  NONE DETECTED (Cut Off Level 300 ng/mL) Final   POC Buprenorphine (BUP) 05/09/2022 None Detected  NONE DETECTED (Cut Off Level 10 ng/mL) Final   POC Oxazepam (BZO) 05/09/2022 None Detected  NONE DETECTED (Cut Off Level 300 ng/mL) Final   POC Cocaine UR 05/09/2022 Positive (A)  NONE DETECTED (Cut Off Level 300 ng/mL) Final   POC Methamphetamine UR 05/09/2022 None Detected  NONE DETECTED (Cut Off Level 1000 ng/mL) Final   POC Morphine 05/09/2022 None Detected  NONE DETECTED (Cut Off Level 300 ng/mL) Final   POC Methadone UR 05/09/2022 None Detected  NONE DETECTED (Cut Off Level 300 ng/mL) Final   POC Oxycodone UR 05/09/2022 None Detected  NONE DETECTED (Cut Off Level 100 ng/mL) Final   POC Marijuana UR 05/09/2022 Positive (A)  NONE DETECTED (Cut Off Level 50 ng/mL) Final   SARSCOV2ONAVIRUS 2 AG 05/09/2022 NEGATIVE  NEGATIVE Final   Comment: (NOTE) SARS-CoV-2 antigen NOT DETECTED.   Negative results are presumptive.  Negative results do not preclude SARS-CoV-2 infection and should not be used as the sole basis for treatment or other patient management decisions, including infection  control decisions, particularly in the presence of clinical signs and  symptoms consistent with COVID-19, or in those who have been in contact with the virus.  Negative results must be combined with clinical observations, patient history, and epidemiological information. The expected result is Negative.  Fact  Sheet for Patients: https://www.jennings-kim.com/  Fact Sheet for Healthcare Providers: https://-rogers.biz/  This test is not yet approved or cleared by the [place] FDA and  has been authorized for detection and/or diagnosis of SARS-CoV-2 by FDA under an Emergency Use Authorization (EUA).  This EUA will remain in effect (meaning this test can be used) for the duration of  the COV  ID-19 declaration under Section 564(b)(1) of the Act, 21 U.S.C. section 360bbb-3(b)(1), unless the authorization is terminated or revoked sooner.     SARS Coronavirus 2 by RT PCR 05/09/2022 NEGATIVE  NEGATIVE Final   Performed at [facility], [address], [place], [place] [address]   TSH 05/09/2022 2.241  0.350 - 4.500 uIU/mL Final   Comment: Performed by a 3rd Generation assay with a functional sensitivity of <=0.01 uIU/mL. Performed at [facility], [address], [place], [place] [address]   Admission on 03/02/2022, Discharged on 03/02/2022  Component Date Value Ref Range Status   Sodium 03/02/2022 134 (L)  135 - 145 mmol/L Final   Potassium 03/02/2022 3.6  3.5 - 5.1 mmol/L Final   Chloride 03/02/2022 102  98 - 111 mmol/L Final   CO2 03/02/2022 19 (L)  22 - 32 mmol/L Final   Glucose, Bld 03/02/2022 103 (H)  70 - 99 mg/dL Final   Glucose reference range applies only to samples taken after fasting for at least 8 hours.   BUN 03/02/2022 15  6 - 20 mg/dL Final   Creatinine, Ser 03/02/2022 0.63  0.44 - 1.00 mg/dL Final   Calcium 09/81/1914 8.6 (L)  8.9 - 10.3 mg/dL Final   GFR, Estimated 03/02/2022 >60  >60 mL/min Final   Comment: (NOTE) Calculated using the CKD-EPI Creatinine Equation (2021)    Anion gap 03/02/2022 13  5 - 15 Final   Performed at [facility], [address] [place], [place] [address]   WBC 03/02/2022 12.0 (H)  4.0 - 10.5 K/uL Final   RBC 03/02/2022 4.48  3.87 - 5.11 MIL/uL Final   Hemoglobin  03/02/2022 13.5  12.0 - 15.0 g/dL Final   HCT 62/13/0865 39.6  36.0 - 46.0 % Final   MCV 03/02/2022 88.4  80.0 - 100.0 fL Final   MCH 03/02/2022 30.1  26.0 - 34.0 pg Final   MCHC 03/02/2022 34.1  30.0 - 36.0 g/dL Final   RDW 78/46/9629 13.1  11.5 - 15.5 % Final   Platelets 03/02/2022 308  150 - 400 K/uL Final   nRBC 03/02/2022 0.0  0.0 - 0.2 % Final   Performed at [facility], [address] [place], [place] [address]   Troponin I (High Sensitivity) 03/02/2022 3  <18 ng/L Final   Comment: (NOTE) Elevated high sensitivity troponin I (hsTnI) values and significant  changes across serial measurements may suggest ACS but many other  chronic and acute conditions are known to elevate hsTnI results.  Refer to the "Links" section for chest pain algorithms and additional  guidance. Performed at [facility], [address] [place], [place] [address]    Preg Test, Ur 03/02/2022 NEGATIVE  NEGATIVE Final   Comment:        THE SENSITIVITY OF THIS METHODOLOGY IS >24 mIU/mL    Total Protein 03/02/2022 7.2  6.5 - 8.1 g/dL Final   Albumin 11/15/2534 4.2  3.5 - 5.0 g/dL Final   AST 64/40/3474 25  15 - 41 U/L Final   ALT 03/02/2022 20  0 - 44 U/L Final   Alkaline Phosphatase 03/02/2022 76  38 - 126 U/L Final   Total Bilirubin 03/02/2022 1.1  0.3 - 1.2 mg/dL Final   Bilirubin, Direct 03/02/2022 0.2  0.0 - 0.2 mg/dL Final   Indirect Bilirubin 03/02/2022 0.9  0.3 - 0.9 mg/dL Final   Performed at [facility], [address], [place], [place] [address]  Lipase 03/02/2022 34  11 - 51 U/L Final   Performed at [facility], [address] [place], [place] [address]   D-Dimer, Quant 03/02/2022 0.92 (H)  0.00 - 0.50 ug/mL-FEU Final   Comment: (NOTE) At the manufacturer cut-off value of 0.5 g/mL FEU, this assay has a negative predictive value of 95-100%.This assay is intended for use in conjunction with a clinical pretest probability (PTP) assessment model to  exclude pulmonary embolism (PE) and deep venous thrombosis (DVT) in outpatients suspected of PE or DVT. Results should be correlated with clinical presentation. Performed at [facility], [address] [place], [place] [address]    Lactic Acid, Venous 03/02/2022 1.1  0.5 - 1.9 mmol/L Final   Performed at [facility], [address] [place], [place] [address]   Troponin I (High Sensitivity) 03/02/2022 3  <18 ng/L Final   Comment: (NOTE) Elevated high sensitivity troponin I (hsTnI) values and significant  changes across serial measurements may suggest ACS but many other  chronic and acute conditions are known to elevate hsTnI results.  Refer to the "Links" section for chest pain algorithms and additional  guidance. Performed at [facility], [address] [place], [place] [address]    Color, Urine 03/02/2022 YELLOW (A)  YELLOW Final   APPearance 03/02/2022 CLEAR (A)  CLEAR Final   Specific Gravity, Urine 03/02/2022 1.030  1.005 - 1.030 Final   pH 03/02/2022 6.0  5.0 - 8.0 Final   Glucose, UA 03/02/2022 NEGATIVE  NEGATIVE mg/dL Final   Hgb urine dipstick 03/02/2022 NEGATIVE  NEGATIVE Final   Bilirubin Urine 03/02/2022 NEGATIVE  NEGATIVE Final   Ketones, ur 03/02/2022 NEGATIVE  NEGATIVE mg/dL Final   Protein, ur 29/56/2130 NEGATIVE  NEGATIVE mg/dL Final   Nitrite 86/57/8469 NEGATIVE  NEGATIVE Final   Leukocytes,Ua 03/02/2022 MODERATE (A)  NEGATIVE Final   RBC / HPF 03/02/2022 0-5  0 - 5 RBC/hpf Final   WBC, UA 03/02/2022 0-5  0 - 5 WBC/hpf Final   Bacteria, UA 03/02/2022 RARE (A)  NONE SEEN Final   Squamous Epithelial / HPF 03/02/2022 0-5  0 - 5 /HPF Final   Performed at [facility], [address] [place], [place] [address]   SARS Coronavirus 2 by RT PCR 03/02/2022 NEGATIVE  NEGATIVE Final   Comment: (NOTE) SARS-CoV-2 target nucleic acids are NOT DETECTED.  The SARS-CoV-2 RNA is generally detectable in upper respiratory specimens  during the acute phase of infection. The lowest concentration of SARS-CoV-2 viral copies this assay can detect is 138 copies/mL. A negative result does not preclude SARS-Cov-2 infection and should not be used as the sole basis for treatment or other patient management decisions. A negative result may occur with  improper specimen collection/handling, submission of specimen other than nasopharyngeal swab, presence of viral mutation(s) within the areas targeted by this assay, and inadequate number of viral copies(<138 copies/mL). A negative result must be combined with clinical observations, patient history, and epidemiological information. The expected result is Negative.  Fact Sheet for Patients:  BloggerCourse.com  Fact Sheet for Healthcare Providers:  SeriousBroker.it  This test is no                          t yet approved or cleared by the [place] FDA and  has been authorized for detection and/or diagnosis of SARS-CoV-2 by FDA under an Emergency Use Authorization (EUA). This EUA will remain  in effect (meaning this  test can be used) for the duration of the COVID-19 declaration under Section 564(b)(1) of the Act, 21 U.S.C.section 360bbb-3(b)(1), unless the authorization is terminated  or revoked sooner.       Influenza A by PCR 03/02/2022 NEGATIVE  NEGATIVE Final   Influenza B by PCR 03/02/2022 NEGATIVE  NEGATIVE Final   Comment: (NOTE) The Xpert Xpress SARS-CoV-2/FLU/RSV plus assay is intended as an aid in the diagnosis of influenza from Nasopharyngeal swab specimens and should not be used as a sole basis for treatment. Nasal washings and aspirates are unacceptable for Xpert Xpress SARS-CoV-2/FLU/RSV testing.  Fact Sheet for Patients: BloggerCourse.com  Fact Sheet for Healthcare Providers: SeriousBroker.it  This test is not yet approved or cleared by the [place] FDA and has been authorized for detection and/or diagnosis of SARS-CoV-2 by FDA under an Emergency Use Authorization (EUA). This EUA will remain in effect (meaning this test can be used) for the duration of the COVID-19 declaration under Section 564(b)(1) of the Act, 21 U.S.C. section 360bbb-3(b)(1), unless the authorization is terminated or revoked.     Resp Syncytial Virus by PCR 03/02/2022 NEGATIVE  NEGATIVE Final   Comment: (NOTE) Fact Sheet for Patients: BloggerCourse.com  Fact Sheet for Healthcare Providers: SeriousBroker.it  This test is not yet approved or cleared by the [place] FDA and has been authorized for detection and/or diagnosis of SARS-CoV-2 by FDA under an Emergency Use Authorization (EUA). This EUA will remain in effect (meaning this test can be used) for the duration of the COVID-19 declaration under Section 564(b)(1) of the Act, 21 U.S.C. section 360bbb-3(b)(1), unless the authorization is terminated or revoked.  Performed at [facility], [address] [place], [place] [address]    Procalcitonin 03/02/2022 <0.10  ng/mL Final   Comment:        Interpretation: PCT (Procalcitonin) <= 0.5 ng/mL: Systemic infection (sepsis) is not likely. Local bacterial infection is possible. (NOTE)       Sepsis PCT Algorithm           Lower Respiratory Tract                                      Infection PCT Algorithm    ----------------------------     ----------------------------         PCT < 0.25 ng/mL                PCT < 0.10 ng/mL          Strongly encourage             Strongly discourage   discontinuation of antibiotics    initiation of antibiotics    ----------------------------     -----------------------------       PCT 0.25 - 0.50 ng/mL            PCT 0.10 - 0.25 ng/mL               OR       >80% decrease in PCT            Discourage initiation of                                             antibiotics      Encourage discontinuation  of antibiotics    ----------------------------     -----------------------------         PCT >= 0.50 ng/mL              PCT 0.26 - 0.50 ng/mL               AND                                 <80% decrease in PCT             Encourage initiation of                                             antibiotics       Encourage continuation           of antibiotics    ----------------------------     -----------------------------        PCT >= 0.50 ng/mL                  PCT > 0.50 ng/mL               AND         increase in PCT                  Strongly encourage                                      initiation of antibiotics    Strongly encourage escalation           of antibiotics                                     -----------------------------                                           PCT <= 0.25 ng/mL                                                 OR                                        > 80% decrease in PCT                                      Discontinue / Do not initiate                                             antibiotics  Performed at [facility], [address] [place], [place] [address]    Campylobacter species 03/02/2022 NOT DETECTED  NOT DETECTED Final   Plesimonas shigelloides 03/02/2022 NOT DETECTED  NOT DETECTED Final   Salmonella species 03/02/2022 NOT DETECTED  NOT DETECTED Final   Yersinia enterocolitica 03/02/2022 NOT DETECTED  NOT DETECTED Final   Vibrio species 03/02/2022 NOT DETECTED  NOT DETECTED Final   Vibrio cholerae 03/02/2022 NOT DETECTED  NOT DETECTED Final   Enteroaggregative E coli (EAEC) 03/02/2022 NOT DETECTED  NOT DETECTED Final   Enteropathogenic E coli (EPEC) 03/02/2022 NOT DETECTED  NOT DETECTED Final   Enterotoxigenic E coli (ETEC) 03/02/2022 NOT DETECTED  NOT DETECTED Final   Shiga like toxin producing E coli * 03/02/2022 NOT DETECTED  NOT DETECTED Final    Shigella/Enteroinvasive E coli (EI* 03/02/2022 NOT DETECTED  NOT DETECTED Final   Cryptosporidium 03/02/2022 DETECTED (A)  NOT DETECTED Final   Cyclospora cayetanensis 03/02/2022 NOT DETECTED  NOT DETECTED Final   Entamoeba histolytica 03/02/2022 NOT DETECTED  NOT DETECTED Final   Giardia lamblia 03/02/2022 NOT DETECTED  NOT DETECTED Final   Adenovirus F40/41 03/02/2022 NOT DETECTED  NOT DETECTED Final   Astrovirus 03/02/2022 NOT DETECTED  NOT DETECTED Final   Norovirus GI/GII 03/02/2022 NOT DETECTED  NOT DETECTED Final   Rotavirus A 03/02/2022 NOT DETECTED  NOT DETECTED Final   Sapovirus (I, II, IV, and V) 03/02/2022 NOT DETECTED  NOT DETECTED Final   Performed at [facility], [address] [place], [place] [address]  Admission on 12/17/2021, Discharged on 12/17/2021  Component Date Value Ref Range Status   Sodium 12/17/2021 138  135 - 145 mmol/L Final   Potassium 12/17/2021 3.3 (L)  3.5 - 5.1 mmol/L Final   Chloride 12/17/2021 109  98 - 111 mmol/L Final   CO2 12/17/2021 21 (L)  22 - 32 mmol/L Final   Glucose, Bld 12/17/2021 122 (H)  70 - 99 mg/dL Final   Glucose reference range applies only to samples taken after fasting for at least 8 hours.   BUN 12/17/2021 5 (L)  6 - 20 mg/dL Final   Creatinine, Ser 12/17/2021 0.59  0.44 - 1.00 mg/dL Final   Calcium 60/45/4098 8.8 (L)  8.9 - 10.3 mg/dL Final   GFR, Estimated 12/17/2021 >60  >60 mL/min Final   Comment: (NOTE) Calculated using the CKD-EPI Creatinine Equation (2021)    Anion gap 12/17/2021 8  5 - 15 Final   Performed at [facility], [address] [place], [place] [address]   WBC 12/17/2021 8.4  4.0 - 10.5 K/uL Final   RBC 12/17/2021 4.34  3.87 - 5.11 MIL/uL Final   Hemoglobin 12/17/2021 12.9  12.0 - 15.0 g/dL Final   HCT 78/29/5621 38.7  36.0 - 46.0 % Final   MCV 12/17/2021 89.2  80.0 - 100.0 fL Final   MCH 12/17/2021 29.7  26.0 - 34.0 pg Final   MCHC 12/17/2021 33.3  30.0 - 36.0 g/dL Final   RDW  30/86/5784 13.0  11.5 - 15.5 % Final   Platelets 12/17/2021 303  150 - 400 K/uL Final   nRBC 12/17/2021 0.0  0.0 - 0.2 % Final   Performed at [facility], [address] [place], [place] [address]   Troponin I (High Sensitivity) 12/17/2021 <2  <18 ng/L Final   Comment: (NOTE) Elevated high sensitivity troponin I (hsTnI) values and significant  changes across serial measurements may suggest ACS but many other  chronic and acute conditions are known to elevate hsTnI results.  Refer to the "Links" section for chest pain algorithms and additional  guidance. Performed at [facility], [address] [place], [place] [address]    D-Dimer, Sharene Butters 12/17/2021 0.50  0.00 - 0.50 ug/mL-FEU Final   Comment: (NOTE) At the manufacturer cut-off value of 0.5 g/mL FEU, this assay has a negative predictive value of 95-100%.This assay is intended for use in conjunction with a clinical pretest probability (PTP) assessment model to exclude pulmonary embolism (PE) and deep venous thrombosis (DVT) in outpatients suspected of PE or DVT. Results should be correlated with clinical presentation. Performed at [facility], [address] [place], [place] [address]    SARS Coronavirus 2 by RT PCR 12/17/2021 NEGATIVE  NEGATIVE Final   Comment: (NOTE) SARS-CoV-2 target nucleic acids are NOT DETECTED.  The SARS-CoV-2 RNA is generally detectable in upper respiratory specimens during the acute phase of infection. The lowest concentration of SARS-CoV-2 viral copies this assay can detect is 138 copies/mL. A negative result does not preclude SARS-Cov-2 infection and should not be used as the sole basis for treatment or other patient management decisions. A negative result may occur with  improper specimen collection/handling, submission of specimen other than nasopharyngeal swab, presence of viral mutation(s) within the areas targeted by this assay, and inadequate number of  viral copies(<138 copies/mL). A negative result must be combined with clinical observations, patient history, and epidemiological information. The expected result is Negative.  Fact Sheet for Patients:  BloggerCourse.com  Fact Sheet for Healthcare Providers:  SeriousBroker.it  This test is no                          t yet approved or cleared by the [place] FDA and  has been authorized for detection and/or diagnosis of SARS-CoV-2 by FDA under an Emergency Use Authorization (EUA). This EUA will remain  in effect (meaning this test can be used) for the duration of the COVID-19 declaration under Section 564(b)(1) of the Act, 21 U.S.C.section 360bbb-3(b)(1), unless the authorization is terminated  or revoked sooner.       Influenza A by PCR 12/17/2021 NEGATIVE  NEGATIVE Final   Influenza B by PCR 12/17/2021 NEGATIVE  NEGATIVE Final   Comment: (NOTE) The Xpert Xpress SARS-CoV-2/FLU/RSV plus assay is intended as an aid in the diagnosis of influenza from Nasopharyngeal swab specimens and should not be used as a sole basis for treatment. Nasal washings and aspirates are unacceptable for Xpert Xpress SARS-CoV-2/FLU/RSV testing.  Fact Sheet for Patients: BloggerCourse.com  Fact Sheet for Healthcare Providers: SeriousBroker.it  This test is not yet approved or cleared by the [place] FDA and has been authorized for detection and/or diagnosis of SARS-CoV-2 by FDA under an Emergency Use Authorization (EUA). This EUA will remain in effect (meaning this test can be used) for the duration of the COVID-19 declaration under Section 564(b)(1) of the Act, 21 U.S.C. section 360bbb-3(b)(1), unless the authorization is terminated or revoked.  Performed at [facility], [address], [place], [place] [address]    hCG, Clement Sayres, Kathie Rhodes 12/17/2021 <1  <5 mIU/mL Final    Comment:          GEST. AGE      CONC.  (mIU/mL)   <=1 WEEK        5 - 50     2 WEEKS       50 - 500     3 WEEKS       100 - 10,000     4 WEEKS  1,000 - 30,000     5 WEEKS     3,500 - 115,000   6-8 WEEKS     12,000 - 270,000    12 WEEKS     15,000 - 220,000        FEMALE AND NON-PREGNANT FEMALE:     LESS THAN 5 mIU/mL Performed at [facility], [address] [place], [place] [address]   Admission on 11/24/2021, Discharged on 11/25/2021  Component Date Value Ref Range Status   Sodium 11/24/2021 139  135 - 145 mmol/L Final   Potassium 11/24/2021 3.6  3.5 - 5.1 mmol/L Final   Chloride 11/24/2021 111  98 - 111 mmol/L Final   CO2 11/24/2021 22  22 - 32 mmol/L Final   Glucose, Bld 11/24/2021 101 (H)  70 - 99 mg/dL Final   Glucose reference range applies only to samples taken after fasting for at least 8 hours.   BUN 11/24/2021 10  6 - 20 mg/dL Final   Creatinine, Ser 11/24/2021 0.55  0.44 - 1.00 mg/dL Final   Calcium 78/46/9629 8.5 (L)  8.9 - 10.3 mg/dL Final   GFR, Estimated 11/24/2021 >60  >60 mL/min Final   Comment: (NOTE) Calculated using the CKD-EPI Creatinine Equation (2021)    Anion gap 11/24/2021 6  5 - 15 Final   Performed at [facility], [address] [place], [place] [address]   WBC 11/24/2021 11.2 (H)  4.0 - 10.5 K/uL Final   RBC 11/24/2021 3.94  3.87 - 5.11 MIL/uL Final   Hemoglobin 11/24/2021 11.9 (L)  12.0 - 15.0 g/dL Final   HCT 32/44/0102 35.1 (L)  36.0 - 46.0 % Final   MCV 11/24/2021 89.1  80.0 - 100.0 fL Final   MCH 11/24/2021 30.2  26.0 - 34.0 pg Final   MCHC 11/24/2021 33.9  30.0 - 36.0 g/dL Final   RDW 72/53/6644 12.8  11.5 - 15.5 % Final   Platelets 11/24/2021 313  150 - 400 K/uL Final   nRBC 11/24/2021 0.0  0.0 - 0.2 % Final   Performed at [facility], [address] [place], [place] [address]   Troponin I (High Sensitivity) 11/24/2021 233 (HH)  <18 ng/L Final   Comment: CRITICAL RESULT CALLED TO, READ BACK BY AND  VERIFIED WITH VET HOOKER RN [id number] 11/24/21 HNM (NOTE) Elevated high sensitivity troponin I (hsTnI) values and significant  changes across serial measurements may suggest ACS but many other  chronic and acute conditions are known to elevate hsTnI results.  Refer to the "Links" section for chest pain algorithms and additional  guidance. Performed at [facility], [address] [place], [place] [address]    Hgb A1c MFr Bld 11/24/2021 4.8  4.8 - 5.6 % Final   Comment: (NOTE) Pre diabetes:          5.7%-6.4%  Diabetes:              >6.4%  Glycemic control for   <7.0% adults with diabetes    Mean Plasma Glucose 11/24/2021 91.06  mg/dL Final   Performed at [facility], [address] [place], [place] [address]   HIV Screen 4th Generation wRfx 11/25/2021 Non Reactive  Non Reactive Final   Performed at [facility], [address], [place], [place] [address]   Weight 11/24/2021 3,040  oz Final   Height 11/24/2021 70  in Final   BP 11/24/2021 90/63  mmHg Final   Ao pk vel  11/24/2021 1.46  m/s Final   AV Area VTI 11/24/2021 2.08  cm2 Final   AR max vel 11/24/2021 2.11  cm2 Final   AV Mean grad 11/24/2021 4.0  mmHg Final   AV Peak grad 11/24/2021 8.5  mmHg Final   S' Lateral 11/24/2021 3.10  cm Final   AV Area mean vel 11/24/2021 2.29  cm2 Final   Area-P 1/2 11/24/2021 4.63  cm2 Final   B Natriuretic Peptide 11/24/2021 239.9 (H)  0.0 - 100.0 pg/mL Final   Performed at [facility], [address] [place], [place] [address]   Troponin I (High Sensitivity) 11/24/2021 117 (HH)  <18 ng/L Final   Comment: CRITICAL VALUE NOTED. VALUE IS CONSISTENT WITH PREVIOUSLY REPORTED/CALLED VALUE SKL (NOTE) Elevated high sensitivity troponin I (hsTnI) values and significant  changes across serial measurements may suggest ACS but many other  chronic and acute conditions are known to elevate hsTnI results.  Refer to the "Links" section for chest pain algorithms and  additional  guidance. Performed at [facility], [address] [place], [place] [address]    Cholesterol 11/25/2021 131  0 - 200 mg/dL Final   Triglycerides 82/95/6213 110  <150 mg/dL Final   HDL 08/65/7846 33 (L)  >40 mg/dL Final   Total CHOL/HDL Ratio 11/25/2021 4.0  RATIO Final   VLDL 11/25/2021 22  0 - 40 mg/dL Final   LDL Cholesterol 11/25/2021 76  0 - 99 mg/dL Final   Comment:        Total Cholesterol/HDL:CHD Risk Coronary Heart Disease Risk Table                     Men   Women  1/2 Average Risk   3.4   3.3  Average Risk       5.0   4.4  2 X Average Risk   9.6   7.1  3 X Average Risk  23.4   11.0        Use the calculated Patient Ratio above and the CHD Risk Table to determine the patient's CHD Risk.        ATP III CLASSIFICATION (LDL):  <100     mg/dL   Optimal  962-952  mg/dL   Near or Above                    Optimal  130-159  mg/dL   Borderline  841-324  mg/dL   High  >401     mg/dL   Very High Performed at [facility], [address] [place], [place] [address]    Sodium 11/25/2021 139  135 - 145 mmol/L Final   Potassium 11/25/2021 3.3 (L)  3.5 - 5.1 mmol/L Final   Chloride 11/25/2021 111  98 - 111 mmol/L Final   CO2 11/25/2021 23  22 - 32 mmol/L Final   Glucose, Bld 11/25/2021 101 (H)  70 - 99 mg/dL Final   Glucose reference range applies only to samples taken after fasting for at least 8 hours.   BUN 11/25/2021 10  6 - 20 mg/dL Final   Creatinine, Ser 11/25/2021 0.63  0.44 - 1.00 mg/dL Final   Calcium 36/64/4034 8.3 (L)  8.9 - 10.3 mg/dL Final   GFR, Estimated 11/25/2021 >60  >60 mL/min Final   Comment: (NOTE) Calculated using the CKD-EPI Creatinine Equation (2021)    Anion gap 11/25/2021 5  5 - 15 Final   Performed at [facility],  [address] [place], [place] [address]   WBC 11/25/2021 7.6  4.0 - 10.5 K/uL Final   RBC 11/25/2021 3.70 (L)  3.87 - 5.11 MIL/uL Final   Hemoglobin 11/25/2021 11.0 (L)  12.0 - 15.0 g/dL  Final   HCT 60/45/4098 33.1 (L)  36.0 - 46.0 % Final   MCV 11/25/2021 89.5  80.0 - 100.0 fL Final   MCH 11/25/2021 29.7  26.0 - 34.0 pg Final   MCHC 11/25/2021 33.2  30.0 - 36.0 g/dL Final   RDW 11/91/4782 12.9  11.5 - 15.5 % Final   Platelets 11/25/2021 289  150 - 400 K/uL Final   nRBC 11/25/2021 0.0  0.0 - 0.2 % Final   Performed at [facility], [address] [place], [place] [address]   Troponin I (High Sensitivity) 11/24/2021 110 (HH)  <18 ng/L Final   Comment: CRITICAL VALUE NOTED. VALUE IS CONSISTENT WITH PREVIOUSLY REPORTED/CALLED VALUE SKL (NOTE) Elevated high sensitivity troponin I (hsTnI) values and significant  changes across serial measurements may suggest ACS but many other  chronic and acute conditions are known to elevate hsTnI results.  Refer to the "Links" section for chest pain algorithms and additional  guidance. Performed at [facility], [address] [place], [place] [address]    Troponin I (High Sensitivity) 11/25/2021 97 (H)  <18 ng/L Final   Comment: (NOTE) Elevated high sensitivity troponin I (hsTnI) values and significant  changes across serial measurements may suggest ACS but many other  chronic and acute conditions are known to elevate hsTnI results.  Refer to the "Links" section for chest pain algorithms and additional  guidance. Performed at [facility], [address] [place], [place] [address]    Glucose-Capillary 11/25/2021 106 (H)  70 - 99 mg/dL Final   Glucose reference range applies only to samples taken after fasting for at least 8 hours.    Blood Alcohol level:  Lab Results  Component Value Date   ETH 34 (H) 05/09/2022   ETH 18 (H) 10/29/2019    Metabolic Disorder Labs: Lab Results  Component Value Date   HGBA1C 5.1 05/09/2022   MPG 99.67 05/09/2022   MPG 91.06 11/24/2021   Lab Results  Component Value Date   PROLACTIN 14.3 10/29/2019   Lab Results  Component Value Date   CHOL 247 (H)  05/09/2022   TRIG 233 (H) 05/09/2022   HDL 69 05/09/2022   CHOLHDL 3.6 05/09/2022   VLDL 47 (H) 05/09/2022   LDLCALC 131 (H) 05/09/2022   LDLCALC 76 11/25/2021    Therapeutic Lab Levels: No results found for: "LITHIUM" No results found for: "VALPROATE" No results found for: "CBMZ"  Physical Findings   PHQ2-9    Flowsheet Row ED from 05/09/2022 in [facility] ED from 10/29/2019 in [facility]  PHQ-2 Total Score 2 4  PHQ-9 Total Score 8 14      Flowsheet Row ED from 05/09/2022 in [facility] ED from 03/02/2022 in [facility] Emergency Department at [facility] ED to Hosp-Admission (Discharged) from 11/24/2021 in [facility] REGIONAL CARDIAC MED PCU  C-SSRS RISK CATEGORY No Risk No Risk No Risk        Musculoskeletal  Strength & Muscle Tone: within normal limits Gait & Station: unsteady Patient leans: N/A  Psychiatric Specialty Exam  Presentation  General Appearance:  Disheveled  Eye Contact: Fair  Speech: Clear and Coherent; Normal Rate  Speech Volume: Normal  Handedness:  Right   Mood and Affect  Mood: Anxious; Dysphoric  Affect: Congruent   Thought Process  Thought Processes: Coherent; Goal Directed  Descriptions of Associations:Intact  Orientation:Full (Time, Place and Person)  Thought Content:Logical; WDL  Diagnosis of Schizophrenia or Schizoaffective disorder in past: No    Hallucinations:Hallucinations: None  Ideas of Reference:None  Suicidal Thoughts:Suicidal Thoughts: No SI Active Intent and/or Plan: Without Intent; Without Plan  Homicidal Thoughts:Homicidal Thoughts: No   Sensorium  Memory: Immediate Good; Recent Good  Judgment: Intact  Insight: Present   Executive Functions  Concentration: Good  Attention Span: Good  Recall: Good  Fund of Knowledge: Good  Language: Good   Psychomotor Activity  Psychomotor  Activity: Psychomotor Activity: Normal   Assets  Assets: Communication Skills; Desire for Improvement; Financial Resources/Insurance; Housing; Physical Health; Resilience; Social Support   Sleep  Sleep: Sleep: Poor (due to withdrawal symptoms)   Nutritional Assessment (For OBS and FBC admissions only) Has the patient had a weight loss or gain of 10 pounds or more in the last 3 months?: No Has the patient had a decrease in food intake/or appetite?: Yes (For the last 3 days patient reports she hasn't eaten anything.) Does the patient have dental problems?: No Does the patient have eating habits or behaviors that may be indicators of an eating disorder including binging or inducing vomiting?: No Has the patient recently lost weight without trying?: 0 Has the patient been eating poorly because of a decreased appetite?: 1 Malnutrition Screening Tool Score: 1    Physical Exam  Physical Exam Vitals and nursing note reviewed.  Constitutional:      General: She is not in acute distress.    Appearance: She is normal weight. She is ill-appearing and diaphoretic. She is not toxic-appearing.  HENT:     Head: Normocephalic and atraumatic.  Pulmonary:     Effort: Pulmonary effort is normal.  Neurological:     Mental Status: She is alert.    Review of Systems  Constitutional:  Positive for chills, diaphoresis and malaise/fatigue.  Respiratory:  Negative for cough and shortness of breath.   Cardiovascular:  Negative for chest pain.  Gastrointestinal:  Negative for abdominal pain, constipation, diarrhea, nausea and vomiting.  Neurological:  Positive for dizziness, weakness and headaches.  Psychiatric/Behavioral:  Negative for depression and suicidal ideas. The patient is not nervous/anxious.    Blood pressure 127/75, pulse 74, temperature 99.1 F (37.3 C), temperature source Oral, resp. rate 16, SpO2 100 %. There is no height or weight on file to calculate BMI.  Treatment Plan  Summary: Daily contact with patient to assess and evaluate symptoms and progress in treatment and Medication management  Rebecca Duffy Risk is a 42 yr old female who presented on 4/20 to [facility] requesting Detox, she was admitted to [facility] on 4/20.  PPHx is significant for Bipolar disorder, PTSD, Polysubstance Abuse (cocaine, marijuana, alcohol, and opiates), and ADHD.   Lovenia is having moderate withdrawal symptoms.  Encouraged her to request as needed medications.  She is not interested in residential rehab or further resources and plans to attend AA meetings.  We will not make any changes to her medications at this time.  We will continue to monitor.   Withdrawal: -Continue CIWA, last score= 0   @  0601  4/21 -Continue Librium 25 mg q6 PRN CIWA>10 -Continue Imodium 2-4 mg PRN diarrhea -Continue Zofran-ODT 4 mg q6 PRN nausea -Continue Thiamine 100 mg daily for nutritional supplementation -Continue Multivitamin daily for nutritional supplementation  Nicotine Dependence: -Continue Nicotine Patch 21 mg daily   -Continue Protonix 40 mg daily -Continue PRN's: Tylenol, Maalox, Atarax, Milk of Magnesia, Trazodone   Dispo: Not interested in rehab   Lauro Franklin, MD 05/10/2022 3:14 PM

## 2022-05-10 NOTE — ED Notes (Signed)
Snacks given 

## 2022-05-10 NOTE — ED Notes (Signed)
Pt sleeping in no acute distress. RR even and unlabored. Environment secured. Will continue to monitor for safety. 

## 2022-05-10 NOTE — ED Notes (Addendum)
Patient is in her room sleeping. Patient completed her PHQ-9. Patient is being monitored for safety.

## 2022-05-10 NOTE — ED Notes (Signed)
Patient was informed about breakfast being served

## 2022-05-10 NOTE — ED Notes (Signed)
Patient was provided with dinner 

## 2022-05-10 NOTE — ED Notes (Signed)
Patient was provided with lunch 

## 2022-05-11 DIAGNOSIS — F1020 Alcohol dependence, uncomplicated: Secondary | ICD-10-CM | POA: Diagnosis not present

## 2022-05-11 DIAGNOSIS — F1220 Cannabis dependence, uncomplicated: Secondary | ICD-10-CM | POA: Diagnosis not present

## 2022-05-11 DIAGNOSIS — Z1152 Encounter for screening for COVID-19: Secondary | ICD-10-CM | POA: Diagnosis not present

## 2022-05-11 DIAGNOSIS — F1919 Other psychoactive substance abuse with unspecified psychoactive substance-induced disorder: Secondary | ICD-10-CM | POA: Diagnosis not present

## 2022-05-11 LAB — PROLACTIN: Prolactin: 19.4 ng/mL (ref 4.8–33.4)

## 2022-05-11 MED ORDER — TRAZODONE HCL 50 MG PO TABS: 50.0000 mg | ORAL_TABLET | Freq: Every evening | ORAL | 0 refills | Status: DC | PRN

## 2022-05-11 MED ORDER — TRAZODONE HCL 50 MG PO TABS: 25.0000 mg | ORAL_TABLET | Freq: Every evening | ORAL | 0 refills | Status: DC | PRN

## 2022-05-11 MED ORDER — TRAZODONE HCL 50 MG PO TABS: 25.0000 mg | ORAL_TABLET | Freq: Every evening | ORAL | Status: DC | PRN

## 2022-05-11 NOTE — Group Note (Signed)
Group Topic: Change and Accountability  Group Date: 05/11/2022 Start Time: 1030 End Time: 1145 Facilitators: Vonzell Schlatter B  Department: [facility]  Number of Participants: 5  Group Focus: clarity of thought Treatment Modality:  Psychoeducation Interventions utilized were patient education Purpose: increase insight  Name: Rebecca Duffy Date of Birth: [birth year]  MR: [id number]    Level of Participation: active Quality of Participation: attentive and cooperative Interactions with others: gave feedback Mood/Affect: positive Triggers (if applicable): na Cognition: coherent/clear Progress: Moderate Response: na Plan: follow-up needed  Patients Problems:  Patient Active Problem List   Diagnosis Date Noted   Polysubstance abuse 05/09/2022   Substance induced mood disorder 05/09/2022   Cocaine use disorder, moderate, dependence 05/09/2022   Passive suicidal ideations 05/09/2022   Chest pain 11/24/2021   Leukocytosis 11/24/2021   NSTEMI (non-ST elevated myocardial infarction) 11/24/2021   Tobacco abuse 11/24/2021   Gastric ulcer 11/24/2021   Alcohol use 11/24/2021   Gastroenteritis 11/24/2021   Bipolar I disorder, most recent episode depressed    Alcohol use disorder, severe, dependence    Cannabis use disorder, severe, dependence

## 2022-05-11 NOTE — ED Provider Notes (Signed)
FBC/OBS ASAP Discharge Summary  Date and Time: 05/11/2022 11:51 AM  Name: Rebecca Duffy  MRN:  [id number]   Discharge Diagnoses:  Final diagnoses:  Substance induced mood disorder  Polysubstance abuse  Cocaine use disorder, moderate, dependence  Cannabis use disorder, severe, dependence  Alcohol use disorder, severe, dependence  Bipolar I disorder, most recent episode depressed  Passive suicidal ideations    Subjective:  Rebecca Duffy is a 42 year old was seen and evaluated face-to-face by this provider.  She adamantly denies suicidal or homicidal ideations.  Denies auditory visual hallucinations.  Reports she has plans to follow-up with her outpatient provider Simon.  Reports she recently had a medication adjustment however is unable to recall the name of the medication that she was switched to.  No cravings or withdrawal symptoms noted.  Support, encouragement and reassurance was provided.  Rebecca Duffy observed sitting in day room interacting with peers. She is alert/oriented x 3; calm/cooperative; and mood congruent with affect. Patient is speaking in a clear tone at moderate volume, and normal pace; with good eye contact. Her thought process is coherent and relevant; There is no indication that she is currently responding to internal/external stimuli or experiencing delusional thought content.  Patient denies suicidal/self-harm/homicidal ideation, psychosis, and paranoia.  Patient has remained calm throughout assessment and has answered questions appropriately.   Per admission assessment note: Per admission assessment note:  " Rebecca Duffy seen face to face by this provider, consulted with Dr. Jannifer Franklin; and chart reviewed on 05/09/22.  On evaluation Rebecca Duffy gives permission for her 88 yr old daughter to sit in on psychiatric assessment.  Patient states she has a chronic history of drug abuse and she started celebrating her birthday and has been out of the home getting high for the  last 2 days.  Patient states today she has had 4 beers (4 [employer]) and $400.00 cocaine over the last 3 days. "   Total Time spent with patient: 15 minutes   Tobacco Cessation:  N/A, patient does not currently use tobacco products  Current Medications:  Current Facility-Administered Medications  Medication Dose Route Frequency Provider Last Rate Last Admin   acetaminophen (TYLENOL) tablet 650 mg  650 mg Oral Q6H PRN Rankin, Shuvon B, NP   650 mg at 05/09/22 1743   alum & mag hydroxide-simeth (MAALOX/MYLANTA) 200-200-20 MG/5ML suspension 30 mL  30 mL Oral Q4H PRN Rankin, Shuvon B, NP       chlordiazePOXIDE (LIBRIUM) capsule 25 mg  25 mg Oral Q6H PRN Rankin, Shuvon B, NP       hydrOXYzine (ATARAX) tablet 25 mg  25 mg Oral Q6H PRN Rankin, Shuvon B, NP   25 mg at 05/10/22 2114   loperamide (IMODIUM) capsule 2-4 mg  2-4 mg Oral PRN Rankin, Shuvon B, NP       magnesium hydroxide (MILK OF MAGNESIA) suspension 30 mL  30 mL Oral Daily PRN Rankin, Shuvon B, NP       multivitamin with minerals tablet 1 tablet  1 tablet Oral Daily Rankin, Shuvon B, NP   1 tablet at 05/11/22 0914   nicotine (NICODERM CQ - dosed in mg/24 hours) patch 21 mg  21 mg Transdermal Daily Rankin, Shuvon B, NP   21 mg at 05/11/22 0914   ondansetron (ZOFRAN-ODT) disintegrating tablet 4 mg  4 mg Oral Q6H PRN Rankin, Shuvon B, NP       pantoprazole (PROTONIX) EC tablet 40 mg  40 mg Oral Daily Rankin, Shuvon B,  NP   40 mg at 05/11/22 1610   thiamine (VITAMIN B1) injection 100 mg  100 mg Intramuscular Once Rankin, Shuvon B, NP       traZODone (DESYREL) tablet 25 mg  25 mg Oral QHS PRN Oneta Rack, NP       Current Outpatient Medications  Medication Sig Dispense Refill   albuterol (VENTOLIN HFA) 108 (90 Base) MCG/ACT inhaler Inhale 2 puffs into the lungs every 4 (four) hours as needed for wheezing or shortness of breath. 8 g 0   BIOTIN PO Take 1 tablet by mouth daily.     calcium-vitamin D (OSCAL WITH D) 500-5 MG-MCG tablet Take  2 tablets by mouth daily with breakfast.     folic acid (FOLVITE) 1 MG tablet Take 1 tablet (1 mg total) by mouth daily. 90 tablet 1   Multiple Vitamin (MULTIVITAMIN WITH MINERALS) TABS tablet Take 1 tablet by mouth daily. 90 tablet 1   nicotine (NICODERM CQ - DOSED IN MG/24 HOURS) 21 mg/24hr patch Place 1 patch (21 mg total) onto the skin daily. 28 patch 0   pantoprazole (PROTONIX) 40 MG tablet Take 1 tablet (40 mg total) by mouth daily. 30 tablet 1   thiamine (VITAMIN B-1) 100 MG tablet Take 1 tablet (100 mg total) by mouth daily. 90 tablet 1   traZODone (DESYREL) 50 MG tablet Take 0.5 tablets (25 mg total) by mouth at bedtime as needed for sleep. 15 tablet 0    PTA Medications:  Facility Ordered Medications  Medication   acetaminophen (TYLENOL) tablet 650 mg   alum & mag hydroxide-simeth (MAALOX/MYLANTA) 200-200-20 MG/5ML suspension 30 mL   magnesium hydroxide (MILK OF MAGNESIA) suspension 30 mL   thiamine (VITAMIN B1) injection 100 mg   multivitamin with minerals tablet 1 tablet   chlordiazePOXIDE (LIBRIUM) capsule 25 mg   hydrOXYzine (ATARAX) tablet 25 mg   loperamide (IMODIUM) capsule 2-4 mg   ondansetron (ZOFRAN-ODT) disintegrating tablet 4 mg   pantoprazole (PROTONIX) EC tablet 40 mg   nicotine (NICODERM CQ - dosed in mg/24 hours) patch 21 mg   traZODone (DESYREL) tablet 25 mg   PTA Medications  Medication Sig   calcium-vitamin D (OSCAL WITH D) 500-5 MG-MCG tablet Take 2 tablets by mouth daily with breakfast.   pantoprazole (PROTONIX) 40 MG tablet Take 1 tablet (40 mg total) by mouth daily.   folic acid (FOLVITE) 1 MG tablet Take 1 tablet (1 mg total) by mouth daily.   Multiple Vitamin (MULTIVITAMIN WITH MINERALS) TABS tablet Take 1 tablet by mouth daily.   thiamine (VITAMIN B-1) 100 MG tablet Take 1 tablet (100 mg total) by mouth daily.   nicotine (NICODERM CQ - DOSED IN MG/24 HOURS) 21 mg/24hr patch Place 1 patch (21 mg total) onto the skin daily.   albuterol (VENTOLIN HFA)  108 (90 Base) MCG/ACT inhaler Inhale 2 puffs into the lungs every 4 (four) hours as needed for wheezing or shortness of breath.   BIOTIN PO Take 1 tablet by mouth daily.   traZODone (DESYREL) 50 MG tablet Take 0.5 tablets (25 mg total) by mouth at bedtime as needed for sleep.       05/11/2022   10:40 AM 05/10/2022    2:52 PM 10/29/2019    4:56 PM  Depression screen PHQ 2/9  Decreased Interest 0 1 2  Down, Depressed, Hopeless 0 1 2  PHQ - 2 Score 0 2 4  Altered sleeping 0 1 1  Tired, decreased energy 0 1 2  Change in appetite 0 1 2  Feeling bad or failure about yourself  0 1 2  Trouble concentrating 0 1 1  Moving slowly or fidgety/restless 0 1 2  Suicidal thoughts 0 0 0  PHQ-9 Score 0 8 14  Difficult doing work/chores  Somewhat difficult Somewhat difficult    Flowsheet Row ED from 05/09/2022 in [facility] ED from 03/02/2022 in [facility] Emergency Department at [facility] ED to Hosp-Admission (Discharged) from 11/24/2021 in [facility]  C-SSRS RISK CATEGORY No Risk No Risk No Risk       Musculoskeletal  Strength & Muscle Tone: within normal limits Gait & Station: normal Patient leans: N/A  Psychiatric Specialty Exam  Presentation  General Appearance:  Disheveled  Eye Contact: Fair  Speech: Clear and Coherent; Normal Rate  Speech Volume: Normal  Handedness: Right   Mood and Affect  Mood: Anxious; Dysphoric  Affect: Congruent   Thought Process  Thought Processes: Coherent; Goal Directed  Descriptions of Associations:Intact  Orientation:Full (Time, Place and Person)  Thought Content:Logical; WDL  Diagnosis of Schizophrenia or Schizoaffective disorder in past: No    Hallucinations:Hallucinations: None  Ideas of Reference:None  Suicidal Thoughts:Suicidal Thoughts: No  Homicidal Thoughts:Homicidal Thoughts: No   Sensorium  Memory: Immediate Good; Recent  Good  Judgment: Intact  Insight: Present   Executive Functions  Concentration: Good  Attention Span: Good  Recall: Good  Fund of Knowledge: Good  Language: Good   Psychomotor Activity  Psychomotor Activity: Psychomotor Activity: Normal   Assets  Assets: Communication Skills; Desire for Improvement; Financial Resources/Insurance; Housing; Physical Health; Resilience; Social Support   Sleep  Sleep: Sleep: Poor (due to withdrawal symptoms)   No data recorded  Physical Exam  Physical Exam Vitals and nursing note reviewed.  Cardiovascular:     Rate and Rhythm: Normal rate and regular rhythm.  Pulmonary:     Effort: Pulmonary effort is normal.     Breath sounds: Normal breath sounds.  Neurological:     Mental Status: She is alert and oriented to person, place, and time.  Psychiatric:        Mood and Affect: Mood normal.        Behavior: Behavior normal.        Thought Content: Thought content normal.    Review of Systems  Respiratory: Negative.    Cardiovascular: Negative.   Skin:        Multiple tattoos noted.   Psychiatric/Behavioral:  Negative for depression and suicidal ideas. The patient is nervous/anxious.   All other systems reviewed and are negative.  Blood pressure 132/89, pulse 80, temperature 98.4 F (36.9 C), temperature source Oral, resp. rate 18, SpO2 99 %. There is no height or weight on file to calculate BMI.  Demographic Factors:  Positive social support, Positive therapeutic relationship, and Positive coping skills or problem solving skills  Loss Factors: Decrease in vocational status  Historical Factors: Impulsivity  Risk Reduction Factors:   Positive social support and Positive therapeutic relationship  Continued Clinical Symptoms:  Alcohol/Substance Abuse/Dependencies  Cognitive Features That Contribute To Risk:  Closed-mindedness    Suicide Risk:  Minimal: No identifiable suicidal ideation.  Patients presenting  with no risk factors but with morbid ruminations; may be classified as minimal risk based on the severity of the depressive symptoms  Plan Of Care/Follow-up recommendations:  Activity:  as tolerated Diet:  heart healthy   Disposition: Take all of you medications as prescribed by your mental healthcare  provider.  Report any adverse effects and reactions from your medications to your outpatient provider promptly.  Do not engage in alcohol and or illegal drug use while on prescription medicines. Keep all scheduled appointments. This is to ensure that you are getting refills on time and to avoid any interruption in your medication.  If you are unable to keep an appointment call to reschedule.  Be sure to follow up with resources and follow ups given.  In the event of worsening symptoms call the crisis hotline, 911, and or go to the nearest emergency department for appropriate evaluation and treatment of symptoms. Follow-up with your primary care provider for your medical issues, concerns and or health care needs.    Oneta Rack, NP 05/11/2022, 11:51 AM

## 2022-05-11 NOTE — ED Notes (Signed)
Patient A&Ox4. Denies intent to harm self/others when asked. Denies A/VH. Patient denies any physical complaints when asked. Pt request to discharged to day. Pt states, "I have to be to work in the morning for training on my job". Informed pt that a provider would be in this am to speak with pt and a disposition would be made then. Pt verbalized agreement. Support and encouragement provided. Routine safety checks conducted according to facility protocol. Encouraged patient to notify staff if thoughts of harm toward self or others arise. Patient verbalize understanding and agreement. Will continue to monitor for safety.

## 2022-05-11 NOTE — ED Notes (Signed)
Patient A&O x 4, ambulatory. Patient discharged in no acute distress. Patient denied SI/HI, A/VH upon discharge. Patient verbalized understanding of all discharge instructions explained by staff, to include follow up appointments and safety plan. Patient reported mood 10/10.  Pt belongings returned to patient from locker #17 intact. Patient escorted to lobby via staff for transport to destination. Safety maintained.

## 2022-05-11 NOTE — Discharge Instructions (Addendum)
Take all medications as prescribed. Keep all follow-up appointments as scheduled.  Do not consume alcohol or use illegal drugs while on prescription medications. Report any adverse effects from your medications to your primary care provider promptly.  In the event of recurrent symptoms or worsening symptoms, call 911, a crisis hotline, or go to the nearest emergency department for evaluation.    Based on the information that you have provided and the presenting issues outpatient services and resources for have been recommended.  It is imperative that you follow through with treatment recommendations within 5-7 days from the of discharge to mitigate further risk to your safety and mental well-being. A list of referrals has been provided below to get you started.  You are not limited to the list provided.  In case of an urgent crisis, you may contact the Mobile Crisis Unit with [facility] at [phone]) [phone].   Follow up resources for AA groups are provided below   About A.A. People who think they have a drinking problem are welcome to attend any A.A. meeting. They can sit and listen and learn more about recovery. Or they can share about themselves. It's completely up to them. Everyone is welcome. You don't have to pay anything to attend.  Our members are not professionals on alcoholism or addiction of any kind. We do not provide medical or psychological diagnoses or prognoses. We leave that up to you. A.A. is a supplemental resource. We offer ongoing support for anyone who has a drinking problem based on a solution that works for [place].  Meetings are classified as "open" or "closed". Professionals are welcome to observe "open" A.A. meetings. "Open" meetings are for anyone interested in learning more about what happens in A.A. "Closed" meetings are for only those who have a desire to stop drinking. Find an open meeting here.   A.A. Resources   Resources from A.A. in [facility] an A.A. Coordinator for more information: [email or web address] .org or pi@[email or web address] [email or web address]. We look forward to hearing from you. [place] A.A. Meetings AA support, including regional helplines, is at the local level. Find local AA information: [employer]odging[employer].fi     AA Meeting [facility]  Location:: Address [address] [place], [place], [address]  Weekly Meeting Schedule MONDAY, 8:00 AM - 9:00 AM Early Egbert Garibaldi Discussion Open Virtual Join Early [facility] Online AA Meeting Password: [phone]  TUESDAY, 8:00 AM - 9:00 AM Early Egbert Garibaldi Discussion Open Virtual Join Early [facility] Online AA Meeting Password: [phone]  [facility], 8:00 AM - 9:00 AM Early Egbert Garibaldi Discussion Open Virtual Join Early [facility] Online AA Meeting Password: [phone]  THURSDAY, 8:00 AM - 9:00 AM Early Egbert Garibaldi Discussion Open Virtual Join Early [facility] Online AA Meeting Password: [phone]  FRIDAY, 8:00 AM - 9:00 AM Early Egbert Garibaldi Discussion Open Virtual Join Early [facility] Online AA Meeting Password: [phone]  SATURDAY, 8:00 AM - 9:00 AM Early Egbert Garibaldi Discussion Open Virtual Join Early [facility] Online AA Meeting Password: [phone]  SUNDAY, 8:00 AM - 9:00 AM Early Egbert Garibaldi Discussion Open Virtual Join Early [facility] Online AA Meeting Password: [phone]

## 2022-05-11 NOTE — Group Note (Deleted)
Group Topic: Communication  Group Date: 05/11/2022 Start Time: 0900 End Time: 0930 Facilitators: Prentice Docker, RN  Department: [facility]  Number of Participants: 4  Group Focus: other medication education Treatment Modality:  Psychoeducation Interventions utilized were patient education Purpose: increase insight   Name: Rebecca Duffy Date of Birth: 12-12-1980  MR: [id number]    Level of Participation: {THERAPIES; PSYCH GROUP PARTICIPATION [id number]:19147} Quality of Participation: {THERAPIES; PSYCH QUALITY OF PARTICIPATION:23992} Interactions with others: {THERAPIES; PSYCH INTERACTIONS:23993} Mood/Affect: {THERAPIES; PSYCH MOOD/AFFECT:23994} Triggers (if applicable): *** Cognition: {THERAPIES; PSYCH COGNITION:23995} Progress: {THERAPIES; PSYCH PROGRESS:23997} Response: *** Plan: {THERAPIES; PSYCH WGNF:62130}  Patients Problems:  Patient Active Problem List   Diagnosis Date Noted   Polysubstance abuse 05/09/2022   Substance induced mood disorder 05/09/2022   Cocaine use disorder, moderate, dependence 05/09/2022   Passive suicidal ideations 05/09/2022   Chest pain 11/24/2021   Leukocytosis 11/24/2021   NSTEMI (non-ST elevated myocardial infarction) 11/24/2021   Tobacco abuse 11/24/2021   Gastric ulcer 11/24/2021   Alcohol use 11/24/2021   Gastroenteritis 11/24/2021   Bipolar I disorder, most recent episode depressed    Alcohol use disorder, severe, dependence    Cannabis use disorder, severe, dependence

## 2022-05-11 NOTE — Group Note (Signed)
Group Topic: Communication  Group Date: 05/11/2022 Start Time: 0900 End Time: 0930 Facilitators: Prentice Docker, RN  Department: [facility]  Number of Participants: 4  Group Focus: communication Treatment Modality:  Psychoeducation Interventions utilized were other medication education Purpose: increase insight  Name: Kobie Matkins Date of Birth: 1981-01-06  MR: [id number]    Level of Participation: active Quality of Participation: attentive and cooperative Interactions with others: cooperative Mood/Affect: appropriate and pleasant Triggers (if applicable): n/a Cognition: coherent/clear and insightful Progress: Gaining insight Response: pt verbalized understanding of medication indication Plan: patient will be encouraged to continue medication compliance and report any adverse affects or concerns  Patients Problems:  Patient Active Problem List   Diagnosis Date Noted   Polysubstance abuse 05/09/2022   Substance induced mood disorder 05/09/2022   Cocaine use disorder, moderate, dependence 05/09/2022   Passive suicidal ideations 05/09/2022   Chest pain 11/24/2021   Leukocytosis 11/24/2021   NSTEMI (non-ST elevated myocardial infarction) 11/24/2021   Tobacco abuse 11/24/2021   Gastric ulcer 11/24/2021   Alcohol use 11/24/2021   Gastroenteritis 11/24/2021   Bipolar I disorder, most recent episode depressed    Alcohol use disorder, severe, dependence    Cannabis use disorder, severe, dependence

## 2022-05-11 NOTE — ED Notes (Signed)
CPS called regarding ongoing investigation; they were informed by the pts mother that she was being discharged today and wanted to confirm this information and know if there was any additional information that needed to be added to this case. NP and social worker notified.

## 2022-05-11 NOTE — ED Notes (Signed)
Patient resting quietly in bed with eyes closed, Respirations equal and unlabored. Routine safety checks conducted according to facility protocol. Will continue to monitor for safety. 

## 2022-05-11 NOTE — Care Management (Signed)
[facility] Care Management   Based on the information that you have provided and the presenting issues outpatient services and resources for have been recommended.  It is imperative that you follow through with treatment recommendations within 5-7 days from the of discharge to mitigate further risk to your safety and mental well-being. A list of referrals has been provided below to get you started.  You are not limited to the list provided.  In case of an urgent crisis, you may contact the Mobile Crisis Unit with [facility] at [phone].  Resources for AA groups are provided below   About A.A. People who think they have a drinking problem are welcome to attend any A.A. meeting. They can sit and listen and learn more about recovery. Or they can share about themselves. It's completely up to them. Everyone is welcome. You don't have to pay anything to attend.  Our members are not professionals on alcoholism or addiction of any kind. We do not provide medical or psychological diagnoses or prognoses. We leave that up to you. A.A. is a supplemental resource. We offer ongoing support for anyone who has a drinking problem based on a solution that works for [place].  Meetings are classified as "open" or "closed". Professionals are welcome to observe "open" A.A. meetings. "Open" meetings are for anyone interested in learning more about what happens in A.A. "Closed" meetings are for only those who have a desire to stop drinking. Find an open meeting here.   A.A. Resources   Resources from A.A. in [facility] an A.A. Coordinator for more information: [email or web address] [email or web address] or [email or web address]. We look forward to hearing from you. [place] A.A. Meetings AA support, including regional helplines, is at the local level. Find local AA information: LodgingShop.fi     AA Meeting [facility]  Location:: Address [address] [place], [place], [address]  Weekly Meeting Schedule MONDAY, 8:00 AM - 9:00 AM Early Egbert Garibaldi Discussion Open Virtual Join Early [facility] Online AA Meeting Password: [phone]  TUESDAY, 8:00 AM - 9:00 AM Early Egbert Garibaldi Discussion Open Virtual Join Early [facility] Online AA Meeting Password: [phone]  [facility], 8:00 AM - 9:00 AM Early Egbert Garibaldi Discussion Open Virtual Join Early [facility] Online AA Meeting Password: [phone]  THURSDAY, 8:00 AM - 9:00 AM Early Egbert Garibaldi Discussion Open Virtual Join Early [facility] Online AA Meeting Password: [phone]  FRIDAY, 8:00 AM - 9:00 AM Early Egbert Garibaldi Discussion Open Virtual Join Early [facility] Online AA Meeting Password: [phone]  SATURDAY, 8:00 AM - 9:00 AM Early Egbert Garibaldi Discussion Open Virtual Join Early [facility] Online AA Meeting Password: [phone]  SUNDAY, 8:00 AM - 9:00 AM Early Egbert Garibaldi Discussion Open Virtual Join Early [facility] Online AA Meeting Password: [phone]

## 2022-05-11 NOTE — ED Notes (Signed)
Pt sitting in dayroom interacting with peers. No acute distress noted. No concerns voiced. Informed pt to notify staff with any needs or assistance. Pt verbalized understanding or agreement. Will continue to monitor for safety. 

## 2022-05-18 ENCOUNTER — Ambulatory Visit: Payer: Medicaid Other | Admitting: Obstetrics & Gynecology

## 2022-07-05 NOTE — Progress Notes (Deleted)
PCP:  Pcp, No   No chief complaint on file.    HPI:      [name] is a 42 y.o. No obstetric history on file. whose LMP was No LMP recorded. Patient has had an ablation., presents today for her NP annual examination.  Her menses are {norm/abn:715}, lasting {number: 22536} days.  Dysmenorrhea {dysmen:716}. She {does:18564} have intermenstrual bleeding.  Sex activity: {sex active: 315163}.  Last Pap: {ZOXW:[id number]}  Results were: {norm/abn:16707::"no abnormalities"} /neg HPV DNA *** Hx of STDs: {STD hx:14358}  Last mammogram: never There is no FH of breast cancer. There is no FH of ovarian cancer. The patient {does:18564} do self-breast exams.  Tobacco use: {tob:20664} Alcohol use: {Alcohol:11675} No drug use.  Exercise: {exercise:31265}  She {does:18564} get adequate calcium and Vitamin D in her diet.  Patient Active Problem List   Diagnosis Date Noted   Polysubstance abuse (HCC) 05/09/2022   Substance induced mood disorder (HCC) 05/09/2022   Cocaine use disorder, moderate, dependence (HCC) 05/09/2022   Passive suicidal ideations 05/09/2022   Chest pain 11/24/2021   Leukocytosis 11/24/2021   NSTEMI (non-ST elevated myocardial infarction) (HCC) 11/24/2021   Tobacco abuse 11/24/2021   Gastric ulcer 11/24/2021   Alcohol use 11/24/2021   Gastroenteritis 11/24/2021   Bipolar I disorder, most recent episode depressed (HCC)    Alcohol use disorder, severe, dependence (HCC)    Cannabis use disorder, severe, dependence (HCC)     Past Surgical History:  Procedure Laterality Date   BREAST ENHANCEMENT SURGERY     CESAREAN SECTION     LEFT HEART CATH AND CORONARY ANGIOGRAPHY N/A 11/24/2021   Procedure: LEFT HEART CATH AND CORONARY ANGIOGRAPHY;  Surgeon: Iran Ouch, MD;  Location: [facility] INVASIVE CV LAB;  Service: Cardiovascular;  Laterality: N/A;   TUBAL LIGATION      Family History  Problem Relation Age of Onset   Healthy Mother    Other Father         unknown medical history    Social History   Socioeconomic History   Marital status: Divorced    Spouse name: Not on file   Number of children: Not on file   Years of education: Not on file   Highest education level: Not on file  Occupational History   Not on file  Tobacco Use   Smoking status: Some Days    Types: Cigarettes   Smokeless tobacco: Never  Vaping Use   Vaping Use: Every day  Substance and Sexual Activity   Alcohol use: Yes    Alcohol/week: 6.0 standard drinks of alcohol    Types: 6 Cans of beer per week   Drug use: Yes    Frequency: 2.0 times per week    Types: Marijuana   Sexual activity: Not on file  Other Topics Concern   Not on file  Social History Narrative   Not on file   Social Determinants of Health   Financial Resource Strain: Not on file  Food Insecurity: Food Insecurity Present (05/09/2022)   Hunger Vital Sign    Worried About Running Out of Food in the Last Year: Sometimes true    Ran Out of Food in the Last Year: Sometimes true  Transportation Needs: No Transportation Needs (05/09/2022)   PRAPARE - Administrator, Civil Service (Medical): No    Lack of Transportation (Non-Medical): No  Physical Activity: Not on file  Stress: Not on file  Social Connections: Not on file  Intimate Partner Violence:  Not At Risk (05/09/2022)   Humiliation, Afraid, Rape, and Kick questionnaire    Fear of Current or Ex-Partner: No    Emotionally Abused: No    Physically Abused: No    Sexually Abused: No     Current Outpatient Medications:    albuterol (VENTOLIN HFA) 108 (90 Base) MCG/ACT inhaler, Inhale 2 puffs into the lungs every 4 (four) hours as needed for wheezing or shortness of breath., Disp: 8 g, Rfl: 0   BIOTIN PO, Take 1 tablet by mouth daily., Disp: , Rfl:    calcium-vitamin D (OSCAL WITH D) 500-5 MG-MCG tablet, Take 2 tablets by mouth daily with breakfast., Disp: , Rfl:    folic acid (FOLVITE) 1 MG tablet, Take 1 tablet (1 mg total) by  mouth daily., Disp: 90 tablet, Rfl: 1   Multiple Vitamin (MULTIVITAMIN WITH MINERALS) TABS tablet, Take 1 tablet by mouth daily., Disp: 90 tablet, Rfl: 1   nicotine (NICODERM CQ - DOSED IN MG/24 HOURS) 21 mg/24hr patch, Place 1 patch (21 mg total) onto the skin daily., Disp: 28 patch, Rfl: 0   pantoprazole (PROTONIX) 40 MG tablet, Take 1 tablet (40 mg total) by mouth daily., Disp: 30 tablet, Rfl: 1   thiamine (VITAMIN B-1) 100 MG tablet, Take 1 tablet (100 mg total) by mouth daily., Disp: 90 tablet, Rfl: 1   traZODone (DESYREL) 50 MG tablet, Take 0.5 tablets (25 mg total) by mouth at bedtime as needed for sleep., Disp: 15 tablet, Rfl: 0     ROS:  Review of Systems BREAST: No symptoms   Objective: There were no vitals taken for this visit.   OBGyn Exam  Results: No results found for this or any previous visit (from the past 24 hour(s)).  Assessment/Plan: No diagnosis found.  No orders of the defined types were placed in this encounter.            GYN counsel {counseling: 16159}     F/U  No follow-ups on file.  Rebecca Duffy B. Rebecca Crute, PA-C 07/05/2022 9:32 AM

## 2022-07-06 ENCOUNTER — Ambulatory Visit: Payer: Medicaid Other | Admitting: Obstetrics and Gynecology

## 2022-07-06 DIAGNOSIS — Z1151 Encounter for screening for human papillomavirus (HPV): Secondary | ICD-10-CM

## 2022-07-06 DIAGNOSIS — Z1231 Encounter for screening mammogram for malignant neoplasm of breast: Secondary | ICD-10-CM

## 2022-07-06 DIAGNOSIS — Z124 Encounter for screening for malignant neoplasm of cervix: Secondary | ICD-10-CM

## 2022-07-06 DIAGNOSIS — Z01419 Encounter for gynecological examination (general) (routine) without abnormal findings: Secondary | ICD-10-CM

## 2022-07-22 ENCOUNTER — Other Ambulatory Visit: Payer: Self-pay

## 2022-07-22 ENCOUNTER — Emergency Department
Admission: EM | Admit: 2022-07-22 | Discharge: 2022-07-22 | Disposition: A | Discharge status: Home / Self Care | Payer: MEDICAID | Attending: Emergency Medicine | Admitting: Emergency Medicine

## 2022-07-22 ENCOUNTER — Encounter: Payer: Self-pay | Admitting: Emergency Medicine

## 2022-07-22 ENCOUNTER — Encounter: Payer: Self-pay | Admitting: *Deleted

## 2022-07-22 DIAGNOSIS — R1010 Upper abdominal pain, unspecified: Secondary | ICD-10-CM | POA: Diagnosis present

## 2022-07-22 DIAGNOSIS — R1013 Epigastric pain: Secondary | ICD-10-CM

## 2022-07-22 DIAGNOSIS — K2970 Gastritis, unspecified, without bleeding: Secondary | ICD-10-CM | POA: Diagnosis not present

## 2022-07-22 LAB — COMPREHENSIVE METABOLIC PANEL
ALT: 21 U/L (ref 0–44)
AST: 18 U/L (ref 15–41)
Albumin: 4.1 g/dL (ref 3.5–5.0)
Alkaline Phosphatase: 74 U/L (ref 38–126)
Anion gap: 8 (ref 5–15)
BUN: 17 mg/dL (ref 6–20)
CO2: 20 mmol/L — ABNORMAL LOW (ref 22–32)
Calcium: 8.6 mg/dL — ABNORMAL LOW (ref 8.9–10.3)
Chloride: 106 mmol/L (ref 98–111)
Creatinine, Ser: 0.6 mg/dL (ref 0.44–1.00)
GFR, Estimated: >60 mL/min (ref >60)
Glucose, Bld: 109 mg/dL — ABNORMAL HIGH (ref 70–99)
Potassium: 4 mmol/L (ref 3.5–5.1)
Sodium: 134 mmol/L — ABNORMAL LOW (ref 135–145)
Total Bilirubin: 0.9 mg/dL (ref 0.3–1.2)
Total Protein: 7.1 g/dL (ref 6.5–8.1)

## 2022-07-22 LAB — CBC
HCT: 41.6 % (ref 36.0–46.0)
Hemoglobin: 13.9 g/dL (ref 12.0–15.0)
MCH: 30.2 pg (ref 26.0–34.0)
MCHC: 33.4 g/dL (ref 30.0–36.0)
MCV: 90.4 fL (ref 80.0–100.0)
Platelets: 404 10*3/uL — ABNORMAL HIGH (ref 150–400)
RBC: 4.6 MIL/uL (ref 3.87–5.11)
RDW: 12.7 % (ref 11.5–15.5)
WBC: 12.4 10*3/uL — ABNORMAL HIGH (ref 4.0–10.5)
nRBC: 0 % (ref 0.0–0.2)

## 2022-07-22 LAB — POC URINE PREG, ED
Preg Test, Ur: NEGATIVE
Preg Test, Ur: NEGATIVE

## 2022-07-22 LAB — URINALYSIS, ROUTINE W REFLEX MICROSCOPIC
Bilirubin Urine: NEGATIVE
Glucose, UA: NEGATIVE mg/dL
Hgb urine dipstick: NEGATIVE
Ketones, ur: NEGATIVE mg/dL
Leukocytes,Ua: NEGATIVE
Nitrite: NEGATIVE
Protein, ur: NEGATIVE mg/dL
Specific Gravity, Urine: 1.025 (ref 1.005–1.030)
pH: 5 (ref 5.0–8.0)

## 2022-07-22 LAB — LIPASE, BLOOD: Lipase: 43 U/L (ref 11–51)

## 2022-07-22 MED ORDER — SODIUM CHLORIDE 0.9 % IV SOLN
12.5000 mg | Freq: Once | INTRAVENOUS | Status: AC
  Administered 2022-07-22: 12.5 mg via INTRAVENOUS
  Filled 2022-07-22: qty 12.5

## 2022-07-22 MED ORDER — PANTOPRAZOLE SODIUM 40 MG IV SOLR
40.0000 mg | Freq: Once | INTRAVENOUS | Status: AC
  Administered 2022-07-22: 40 mg via INTRAVENOUS
  Filled 2022-07-22: qty 10

## 2022-07-22 MED ORDER — ONDANSETRON 4 MG PO TBDP: 4.0000 mg | ORAL_TABLET | Freq: Three times a day (TID) | ORAL | 0 refills | Status: DC | PRN

## 2022-07-22 MED ORDER — SODIUM CHLORIDE 0.9 % IV BOLUS
1000.0000 mL | Freq: Once | INTRAVENOUS | Status: AC
  Administered 2022-07-22: 1000 mL via INTRAVENOUS

## 2022-07-22 MED ORDER — ONDANSETRON HCL 4 MG/2ML IJ SOLN
4.0000 mg | Freq: Once | INTRAMUSCULAR | Status: AC
  Administered 2022-07-22: 4 mg via INTRAVENOUS
  Filled 2022-07-22: qty 2

## 2022-07-22 MED ORDER — MORPHINE SULFATE (PF) 4 MG/ML IV SOLN
4.0000 mg | Freq: Once | INTRAVENOUS | Status: AC
  Administered 2022-07-22: 4 mg via INTRAVENOUS
  Filled 2022-07-22: qty 1

## 2022-07-22 MED ORDER — HYDROCODONE-ACETAMINOPHEN 5-325 MG PO TABS: 1.0000 | ORAL_TABLET | ORAL | 0 refills | Status: DC | PRN

## 2022-07-22 MED ORDER — SUCRALFATE 1 G PO TABS: 1.0000 g | ORAL_TABLET | Freq: Four times a day (QID) | ORAL | 1 refills | Status: DC

## 2022-07-22 MED ORDER — HYDROCODONE-ACETAMINOPHEN 5-325 MG PO TABS
1.0000 | ORAL_TABLET | Freq: Once | ORAL | Status: AC
  Administered 2022-07-22: 1 via ORAL
  Filled 2022-07-22: qty 1

## 2022-07-22 NOTE — ED Notes (Signed)
Pt sts she does smoke marijauna daily and has a lot of stomach issues that she is supposed to be taking medication for but sts she does not take them.

## 2022-07-22 NOTE — ED Triage Notes (Signed)
Pt c/o upper abdominal pain associated with NVD that started Saturday night. Endorses drinking Friday night. S/s have been ongoing since.

## 2022-07-22 NOTE — ED Provider Notes (Signed)
[facility] Provider Note    Event Date/Time   First MD Initiated Contact with Patient 07/22/22 [phone]     (approximate)  History   Chief Complaint: Abdominal Pain  HPI  Rebecca Duffy is a 42 y.o. female with a past medical history of bipolar, gastric ulcers/gastritis, presents to the emergency department for upper abdominal pain nausea and vomiting.  According to the patient for the past 4 to 5 days she has been experiencing upper abdominal pain nausea and vomiting.  Patient describes it more as a burning sensation.  States it started shortly after she was drinking alcohol Friday night.  Beginning Saturday she experienced burning upper abdominal pain along with nausea vomiting has experienced intermittent diarrhea as well.  States her symptoms have been improving until today when her symptoms began worsening once again.  Patient has a history of gastritis as well as peptic ulcer disease.  Patient follows up with GI medicine but has not seen them recently.  Physical Exam   Triage Vital Signs: ED Triage Vitals  Enc Vitals Group     BP 07/22/22 0348 (!) 142/79     Pulse Rate 07/22/22 0348 92     Resp 07/22/22 0348 14     Temp 07/22/22 0348 98.6 F (37 C)     Temp Source 07/22/22 0348 Oral     SpO2 07/22/22 0348 99 %     Weight 07/22/22 0349 205 lb (93 kg)     Height 07/22/22 0349 5\' 10"  (1.778 m)     Head Circumference --      Peak Flow --      Pain Score 07/22/22 0348 7     Pain Loc --      Pain Edu? --      Excl. in GC? --     Most recent vital signs: Vitals:   07/22/22 0348  BP: (!) 142/79  Pulse: 92  Resp: 14  Temp: 98.6 F (37 C)  SpO2: 99%    General: Awake, no distress.  CV:  Good peripheral perfusion.  Regular rate and rhythm  Resp:  Normal effort.  Equal breath sounds bilaterally.  Abd:  No distention.  Soft, mild epigastric tenderness to palpation without rebound or guarding  ED Results / Procedures / Treatments   EKG  EKG  viewed and interpreted by myself shows normal sinus rhythm at 82 bpm with a narrow QRS, normal axis, normal intervals, no concerning ST changes.  MEDICATIONS ORDERED IN ED: Medications  sodium chloride 0.9 % bolus 1,000 mL (1,000 mLs Intravenous New Bag/Given 07/22/22 0452)  ondansetron (ZOFRAN) injection 4 mg (4 mg Intravenous Given 07/22/22 0453)  morphine (PF) 4 MG/ML injection 4 mg (4 mg Intravenous Given 07/22/22 0453)  pantoprazole (PROTONIX) injection 40 mg (40 mg Intravenous Given 07/22/22 0453)     IMPRESSION / MDM / ASSESSMENT AND PLAN / ED COURSE  I reviewed the triage vital signs and the nursing notes.  Patient's presentation is most consistent with acute presentation with potential threat to life or bodily function.  Patient presents emergency department nausea vomiting upper abdominal pain described as burning in nature.  History of peptic/gastric ulcers and gastritis follows up with GI medicine was not seen in quite some time.  Patient's workup in the emergency department shows a reassuring CBC reassuring chemistry negative pregnancy test.  Lipase is normal urinalysis is normal.  EKG reassuring.  We will treat pain nausea IV hydrate and continue close monitor.  Patient has  become vomiting once again we will dose IV Phenergan and reassess.  Patient agreeable to plan.  Patient states she is feeling much better now.  Patient states she is tired and she is ready to go home.  Will discharge with sucralfate, patient is already taking Protonix.  Will discharge with a short course of pain and nausea medication have the patient follow-up with her doctor.  Patient already has a GI doctor that she follows up with as well.  FINAL CLINICAL IMPRESSION(S) / ED DIAGNOSES   Gastritis Epigastric pain Nausea vomiting   Note:  This document was prepared using Dragon voice recognition software and may include unintentional dictation errors.   Minna Antis, MD 07/22/22 [phone]

## 2022-07-22 NOTE — Discharge Instructions (Addendum)
Please continue to take your pantoprazole at home each morning.  Please take sucralfate tablet before breakfast, lunch, dinner before going to bed for the next 2 weeks.  Avoid any alcohol, ibuprofen, aspirin or spicy foods for the next 2 weeks.  Please take your pain and nausea medication as needed but only as prescribed.  Do not drink alcohol or drive while taking the pain medication.  Please follow-up with your doctor/GI doctor soon as possible for recheck/reevaluation.  Return to the emergency department for any return of/worsening abdominal pain vomiting unable to keep down fluids or any other symptom personally concerning to yourself.

## 2022-07-22 NOTE — ED Notes (Signed)
ED Provider at bedside. 

## 2022-07-22 NOTE — ED Notes (Signed)
PT brought to ed rm 8 at this time, this RN now assuming care.  

## 2022-07-30 ENCOUNTER — Ambulatory Visit: Payer: Medicaid Other | Admitting: Cardiovascular Disease

## 2022-08-14 ENCOUNTER — Other Ambulatory Visit: Payer: Self-pay

## 2022-08-14 ENCOUNTER — Emergency Department: Payer: MEDICAID

## 2022-08-14 ENCOUNTER — Emergency Department
Admission: EM | Admit: 2022-08-14 | Discharge: 2022-08-14 | Disposition: A | Discharge status: Home / Self Care | Payer: MEDICAID | Attending: Emergency Medicine | Admitting: Emergency Medicine

## 2022-08-14 ENCOUNTER — Ambulatory Visit
Admission: EM | Admit: 2022-08-14 | Discharge: 2022-08-14 | Disposition: A | Discharge status: Home / Self Care | Payer: MEDICAID

## 2022-08-14 DIAGNOSIS — R0602 Shortness of breath: Secondary | ICD-10-CM

## 2022-08-14 DIAGNOSIS — R062 Wheezing: Secondary | ICD-10-CM

## 2022-08-14 DIAGNOSIS — Z7951 Long term (current) use of inhaled steroids: Secondary | ICD-10-CM | POA: Diagnosis not present

## 2022-08-14 DIAGNOSIS — R42 Dizziness and giddiness: Secondary | ICD-10-CM | POA: Diagnosis not present

## 2022-08-14 DIAGNOSIS — K625 Hemorrhage of anus and rectum: Secondary | ICD-10-CM | POA: Diagnosis not present

## 2022-08-14 DIAGNOSIS — J441 Chronic obstructive pulmonary disease with (acute) exacerbation: Secondary | ICD-10-CM | POA: Insufficient documentation

## 2022-08-14 LAB — TYPE AND SCREEN
ABO/RH(D): A POS
Antibody Screen: NEGATIVE

## 2022-08-14 LAB — TROPONIN I (HIGH SENSITIVITY): Troponin I (High Sensitivity): <2 ng/L (ref <18)

## 2022-08-14 LAB — CBC
HCT: 41.2 % (ref 36.0–46.0)
Hemoglobin: 13.8 g/dL (ref 12.0–15.0)
MCH: 29.8 pg (ref 26.0–34.0)
MCHC: 33.5 g/dL (ref 30.0–36.0)
MCV: 89 fL (ref 80.0–100.0)
Platelets: 360 10*3/uL (ref 150–400)
RBC: 4.63 MIL/uL (ref 3.87–5.11)
RDW: 12.6 % (ref 11.5–15.5)
WBC: 8.2 10*3/uL (ref 4.0–10.5)
nRBC: 0 % (ref 0.0–0.2)

## 2022-08-14 LAB — COMPREHENSIVE METABOLIC PANEL
ALT: 18 U/L (ref 0–44)
AST: 18 U/L (ref 15–41)
Albumin: 4.2 g/dL (ref 3.5–5.0)
Alkaline Phosphatase: 76 U/L (ref 38–126)
Anion gap: 11 (ref 5–15)
BUN: 16 mg/dL (ref 6–20)
CO2: 18 mmol/L — ABNORMAL LOW (ref 22–32)
Calcium: 8.9 mg/dL (ref 8.9–10.3)
Chloride: 106 mmol/L (ref 98–111)
Creatinine, Ser: 0.7 mg/dL (ref 0.44–1.00)
GFR, Estimated: >60 mL/min (ref >60)
Glucose, Bld: 95 mg/dL (ref 70–99)
Potassium: 4.4 mmol/L (ref 3.5–5.1)
Sodium: 135 mmol/L (ref 135–145)
Total Bilirubin: 0.9 mg/dL (ref 0.3–1.2)
Total Protein: 7.2 g/dL (ref 6.5–8.1)

## 2022-08-14 LAB — POC URINE PREG, ED: Preg Test, Ur: NEGATIVE

## 2022-08-14 MED ORDER — ALBUTEROL SULFATE HFA 108 (90 BASE) MCG/ACT IN AERS: 4.0000 | INHALATION_SPRAY | RESPIRATORY_TRACT | 0 refills | Status: DC | PRN

## 2022-08-14 MED ORDER — DOXYCYCLINE MONOHYDRATE 100 MG PO TABS: 100.0000 mg | ORAL_TABLET | Freq: Two times a day (BID) | ORAL | 0 refills | Status: AC

## 2022-08-14 MED ORDER — PREDNISONE 50 MG PO TABS: 50.0000 mg | ORAL_TABLET | Freq: Every day | ORAL | 0 refills | Status: AC

## 2022-08-14 MED ORDER — METHYLPREDNISOLONE SODIUM SUCC 125 MG IJ SOLR
125.0000 mg | Freq: Once | INTRAMUSCULAR | Status: AC
  Administered 2022-08-14: 125 mg via INTRAVENOUS
  Filled 2022-08-14: qty 2

## 2022-08-14 MED ORDER — PANTOPRAZOLE SODIUM 40 MG IV SOLR
40.0000 mg | Freq: Once | INTRAVENOUS | Status: AC
  Administered 2022-08-14: 40 mg via INTRAVENOUS
  Filled 2022-08-14: qty 10

## 2022-08-14 MED ORDER — OMEPRAZOLE MAGNESIUM 20 MG PO TBEC: 40.0000 mg | DELAYED_RELEASE_TABLET | Freq: Every day | ORAL | 3 refills | Status: DC

## 2022-08-14 MED ORDER — IPRATROPIUM-ALBUTEROL 0.5-2.5 (3) MG/3ML IN SOLN
6.0000 mL | Freq: Once | RESPIRATORY_TRACT | Status: AC
  Administered 2022-08-14: 6 mL via RESPIRATORY_TRACT
  Filled 2022-08-14: qty 6

## 2022-08-14 NOTE — Discharge Instructions (Signed)
You are seen in the emergency department for shortness of breath and rectal bleeding.  Your hemoglobin level was normal in the emergency department.  You did not meet criteria for blood transfusion.  It is importantly follow-up with gastroenterology to discuss whether you need an endoscopy or colonoscopy.  You were started on an acid reducing medication.  Follow-up with your primary care physician.  You have no signs of pneumonia on your chest x-ray but you had significant wheezing on exam and concern for a COPD exacerbation from your history of tobacco use and daily marijuana use.  You were started on steroid.  You are given a prescription for an albuterol inhaler. Try to decrease/stop your marijuana use.  Prednisone -you are given a prescription for a steroid.  It is important that you take this medication with food.  This medication can cause an upset stomach.  It also can increase your glucose if you have a history of diabetes, so it is important that you check your glucose frequently while you are on this medication.

## 2022-08-14 NOTE — Discharge Instructions (Signed)
Go to the emergency department for evaluation.     

## 2022-08-14 NOTE — ED Triage Notes (Addendum)
Patient to Urgent Care with complaints of wheezing and shortness of breath. Reports symptoms start when she lays down; states she has coughing fits. Reports her symptoms started approx 1-2 weeks ago. Patient states she has COPD and has ran out of her albuterol inhalers. Daily marijuana usage.   Reports waking up this morning with bright red rectal bleeding. Denies any pain. Reports history of hemorrhoids and bleeding ulcers. Taking multiple otc supplements but does not take any of her prescription medications.

## 2022-08-14 NOTE — ED Provider Notes (Signed)
Rebecca Duffy    CSN: [id number] Arrival date & time: 08/14/22  0801      History   Chief Complaint Chief Complaint  Patient presents with   Wheezing    HPI Rebecca Duffy is a 42 y.o. female.  Patient presents with wheezing, shortness of breath, blood in stool this morning.  She states she woke up feeling short of breath and lightheaded.  She has been wheezing for 1-2 weeks.  She noted a large amount of bright red blood in toilet when attempting to pass stool this morning.  She denies fever, chest pain, abdominal pain, vomiting, or other symptoms.  She was seen at [facility] ED on 07/22/2022; diagnosed with epigastric pain and gastritis without bleeding.  Her medical history includes gastric ulcer, NSTEMI, bipolar 1 disorder, mood disorder, polysubstance abuse.  The history is provided by the patient and medical records.    Past Medical History:  Diagnosis Date   ADHD    Bipolar 1 disorder (HCC)    Gastric ulcer    OCD (obsessive compulsive disorder)    PTSD (post-traumatic stress disorder)     Patient Active Problem List   Diagnosis Date Noted   Polysubstance abuse (HCC) 05/09/2022   Substance induced mood disorder (HCC) 05/09/2022   Cocaine use disorder, moderate, dependence (HCC) 05/09/2022   Passive suicidal ideations 05/09/2022   Chest pain 11/24/2021   Leukocytosis 11/24/2021   NSTEMI (non-ST elevated myocardial infarction) (HCC) 11/24/2021   Tobacco abuse 11/24/2021   Gastric ulcer 11/24/2021   Alcohol use 11/24/2021   Gastroenteritis 11/24/2021   Bipolar I disorder, most recent episode depressed (HCC)    Alcohol use disorder, severe, dependence (HCC)    Cannabis use disorder, severe, dependence (HCC)     Past Surgical History:  Procedure Laterality Date   BREAST ENHANCEMENT SURGERY     CESAREAN SECTION     LEFT HEART CATH AND CORONARY ANGIOGRAPHY N/A 11/24/2021   Procedure: LEFT HEART CATH AND CORONARY ANGIOGRAPHY;  Surgeon: Iran Ouch, MD;   Location: [facility] INVASIVE CV LAB;  Service: Cardiovascular;  Laterality: N/A;   TUBAL LIGATION      OB History   No obstetric history on file.      Home Medications    Prior to Admission medications   Medication Sig Start Date End Date Taking? Authorizing Provider  albuterol (VENTOLIN HFA) 108 (90 Base) MCG/ACT inhaler Inhale 2 puffs into the lungs every 4 (four) hours as needed for wheezing or shortness of breath. 12/17/21   Dionne Bucy, MD  BIOTIN PO Take 1 tablet by mouth daily.    [provider]  calcium-vitamin D (OSCAL WITH D) 500-5 MG-MCG tablet Take 2 tablets by mouth daily with breakfast.    [provider]  folic acid (FOLVITE) 1 MG tablet Take 1 tablet (1 mg total) by mouth daily. 11/25/21   Arnetha Courser, MD  HYDROcodone-acetaminophen (NORCO/VICODIN) 5-325 MG tablet Take 1 tablet by mouth every 4 (four) hours as needed. Patient not taking: Reported on 08/14/2022 07/22/22   Minna Antis, MD  Multiple Vitamin (MULTIVITAMIN WITH MINERALS) TABS tablet Take 1 tablet by mouth daily. 11/25/21   Arnetha Courser, MD  nicotine (NICODERM CQ - DOSED IN MG/24 HOURS) 21 mg/24hr patch Place 1 patch (21 mg total) onto the skin daily. 11/25/21   Arnetha Courser, MD  ondansetron (ZOFRAN-ODT) 4 MG disintegrating tablet Take 1 tablet (4 mg total) by mouth every 8 (eight) hours as needed for nausea or vomiting. 07/22/22  Minna Antis, MD  pantoprazole (PROTONIX) 40 MG tablet Take 1 tablet (40 mg total) by mouth daily. 11/25/21   Arnetha Courser, MD  sucralfate (CARAFATE) 1 g tablet Take 1 tablet (1 g total) by mouth 4 (four) times daily. 07/22/22 08/21/22  Minna Antis, MD  thiamine (VITAMIN B-1) 100 MG tablet Take 1 tablet (100 mg total) by mouth daily. 11/25/21   Arnetha Courser, MD  traZODone (DESYREL) 50 MG tablet Take 0.5 tablets (25 mg total) by mouth at bedtime as needed for sleep. Patient not taking: Reported on 08/14/2022 05/11/22   Oneta Rack, NP    Family  History Family History  Problem Relation Age of Onset   Healthy Mother    Other Father        unknown medical history    Social History Social History   Tobacco Use   Smoking status: Some Days    Types: Cigarettes   Smokeless tobacco: Never  Vaping Use   Vaping status: Every Day  Substance Use Topics   Alcohol use: Not Currently    Alcohol/week: 6.0 standard drinks of alcohol    Types: 6 Cans of beer per week    Comment: Reports stopping 3 weeks ago 7/26   Drug use: Yes    Frequency: 2.0 times per week    Types: Marijuana     Allergies   Latex, Penicillins, and Nsaids   Review of Systems Review of Systems  Constitutional:  Negative for chills and fever.  Respiratory:  Positive for cough, shortness of breath and wheezing.   Cardiovascular:  Negative for chest pain and palpitations.  Gastrointestinal:  Positive for blood in stool. Negative for abdominal pain, constipation, diarrhea, nausea and vomiting.  Neurological:  Positive for light-headedness. Negative for syncope, facial asymmetry, weakness and numbness.     Physical Exam Triage Vital Signs ED Triage Vitals  Encounter Vitals Group     BP      Systolic BP Percentile      Diastolic BP Percentile      Pulse      Resp      Temp      Temp src      SpO2      Weight      Height      Head Circumference      Peak Flow      Pain Score      Pain Loc      Pain Education      Exclude from Growth Chart    No data found.  Updated Vital Signs BP 111/66   Pulse 83   Temp 98.1 F (36.7 C)   Resp 20   LMP  (LMP Unknown)   SpO2 99%   Visual Acuity Right Eye Distance:   Left Eye Distance:   Bilateral Distance:    Right Eye Near:   Left Eye Near:    Bilateral Near:     Physical Exam Vitals and nursing note reviewed.  Constitutional:      General: She is not in acute distress.    Appearance: She is well-developed. She is ill-appearing.  HENT:     Mouth/Throat:     Mouth: Mucous membranes are  moist.  Cardiovascular:     Rate and Rhythm: Normal rate and regular rhythm.     Heart sounds: Normal heart sounds.  Pulmonary:     Effort: Pulmonary effort is normal. No respiratory distress.     Breath sounds: Wheezing present.  Comments: Few wheezes.   Abdominal:     General: Bowel sounds are normal.     Palpations: Abdomen is soft.     Tenderness: There is no abdominal tenderness. There is no guarding or rebound.  Genitourinary:    Comments: Patient declines rectal exam. Musculoskeletal:     Cervical back: Neck supple.  Skin:    General: Skin is warm and dry.  Neurological:     General: No focal deficit present.     Mental Status: She is alert and oriented to person, place, and time.     Gait: Gait normal.      UC Treatments / Results  Labs (all labs ordered are listed, but only abnormal results are displayed) Labs Reviewed - No data to display  EKG   Radiology No results found.  Procedures Procedures (including critical care time)  Medications Ordered in UC Medications - No data to display  Initial Impression / Assessment and Plan / UC Course  I have reviewed the triage vital signs and the nursing notes.  Pertinent labs & imaging results that were available during my care of the patient were reviewed by me and considered in my medical decision making (see chart for details).    Rectal bleeding, dizziness, wheezing, shortness of breath.  Afebrile and vital signs are stable.  Patient has few faint wheezes.  No acute respiratory distress.  O2 sat is 99% on room air.  She declines rectal exam at this time.  She has a picture of a large amount of blood in toilet bowl which she states was from this morning.  Discussed limitations of evaluation of her symptoms in an urgent care setting.  Sending her to the ED for evaluation.  She is agreeable to this.  She declines EMS and states she is stable to drive herself to the ED.  Final Clinical Impressions(s) / UC  Diagnoses   Final diagnoses:  Rectal bleeding  Dizziness  Wheezing  Shortness of breath     Discharge Instructions      Go to the emergency department for evaluation.       ED Prescriptions   None    PDMP not reviewed this encounter.   Mickie Bail, NP 08/14/22 0830

## 2022-08-14 NOTE — ED Notes (Signed)
Patient is being discharged from the Urgent Care and sent to the Emergency Department via POV . Per Wendee Beavers NP, patient is in need of higher level of care due to rectal bleeding. Patient is aware and verbalizes understanding of plan of care.  Vitals:   08/14/22 0813  BP: 111/66  Pulse: 83  Resp: 20  Temp: 98.1 F (36.7 C)  SpO2: 99%

## 2022-08-14 NOTE — ED Triage Notes (Signed)
Pt here from UC with rectal bleeding. Pt states she has a lot of gastric issues. Pt states she is also SOB, stats she uses an inhaler but she ran out. Pt states she is supposed to have an colonoscopy but she is scared of the procedure. Pt ambulatory to tx room.

## 2022-08-14 NOTE — ED Provider Notes (Signed)
[facility] Provider Note    Event Date/Time   First MD Initiated Contact with Patient 08/14/22 [phone]     (approximate)   History   Rectal Bleeding   HPI  Rebecca Duffy is a 42 y.o. female past medical history significant for gastric ulcer/gastritis, history of alcohol use, marijuana use, who presents to the emergency department with multiple complaints of shortness of breath and rectal bleeding.  States that she has been having shortness of breath over the past couple of weeks that has been progressively worsening.  Significantly worsened today.  Stated that today she had bright red blood per rectum whenever she was having a bowel movement.  States that she has had a history of hemorrhoids in the past.  Has been told that she needed colonoscopies and endoscopies but have canceled them 4 times because she was scared.  States that she was drinking a significant amount of alcohol but has stopped drinking all alcohol for the past 3 weeks.     Physical Exam   Triage Vital Signs: ED Triage Vitals [08/14/22 0837]  Encounter Vitals Group     BP      Systolic BP Percentile      Diastolic BP Percentile      Pulse      Resp      Temp      Temp src      SpO2      Weight 205 lb 0.4 oz (93 kg)     Height 5\' 10"  (1.778 m)     Head Circumference      Peak Flow      Pain Score 0     Pain Loc      Pain Education      Exclude from Growth Chart     Most recent vital signs: Vitals:   08/14/22 0900 08/14/22 1024  BP: 111/65 132/86  Pulse: 79 86  Resp: (!) 27 20  SpO2: 94% 96%    Physical Exam Constitutional:      Appearance: She is well-developed.  HENT:     Head: Atraumatic.  Eyes:     Conjunctiva/sclera: Conjunctivae normal.  Cardiovascular:     Rate and Rhythm: Regular rhythm.  Pulmonary:     Effort: No respiratory distress.     Breath sounds: Wheezing present.  Abdominal:     General: There is no distension.     Tenderness: There is no  abdominal tenderness.  Genitourinary:    Comments: DRE with no gross blood or melena.  No external hemorrhoids or fissures or tears. Musculoskeletal:        General: Normal range of motion.     Cervical back: Normal range of motion.  Skin:    General: Skin is warm.     Capillary Refill: Capillary refill takes less than 2 seconds.  Neurological:     Mental Status: She is alert. Mental status is at baseline.     IMPRESSION / MDM / ASSESSMENT AND PLAN / ED COURSE  I reviewed the triage vital signs and the nursing notes.  Differential diagnosis including anemia, COPD exacerbation, viral illness including COVID, pneumonia.  PERC negative, doubt PE.  EKG  I, Corena Herter, the attending physician, personally viewed and interpreted this ECG.   Rate: Normal  Rhythm: Normal sinus  Axis: Normal  Intervals: Normal  ST&T Change: None.  Isolated T wave inverted to lead III.  No tachycardic or bradycardic dysrhythmias while on cardiac telemetry.  RADIOLOGY  I independently reviewed imaging, my interpretation of imaging: Chest x-ray no focal findings consistent with pneumonia.  Read as no acute findings.  LABS (all labs ordered are listed, but only abnormal results are displayed) Labs interpreted as -    Labs Reviewed  COMPREHENSIVE METABOLIC PANEL - Abnormal; Notable for the following components:      Result Value   CO2 18 (*)    All other components within normal limits  CBC  POC URINE PREG, ED  POC OCCULT BLOOD, ED  TYPE AND SCREEN  TROPONIN I (HIGH SENSITIVITY)     MDM    Patient was given DuoNeb treatment.  Typed and screened.  Hemoglobin stable.  No leukocytosis.  Initial troponin is undetectable, have a low suspicion for ACS.  Low risk heart score.  Clinical picture is most consistent with lower GI bleed, possible internal hemorrhoids, AVM or diverticulosis.  No significant active bleeding at this time.  Given DuoNeb treatment and prednisone.  Given IV  Protonix.  On reevaluation significant improvement of her shortness of breath and states that she is feeling much better.  Improvement of her wheezing.  Patient was given a prescription for albuterol inhaler, doxycycline and prednisone.  Restarted on omeprazole.  Discussed at length smoking cessation.  Encouraged to continue to have cessation from alcohol.  Discussed close follow-up with her primary care physician early next week for reevaluation.  Discussed getting back with gastroenterology to discuss colonoscopies or endoscopies.  Given return precautions for any worsening shortness of breath or signs of bleeding.   PROCEDURES:  Critical Care performed: No  Procedures  Patient's presentation is most consistent with acute presentation with potential threat to life or bodily function.   MEDICATIONS ORDERED IN ED: Medications  ipratropium-albuterol (DUONEB) 0.5-2.5 (3) MG/3ML nebulizer solution 6 mL (6 mLs Nebulization Given 08/14/22 0923)  pantoprazole (PROTONIX) injection 40 mg (40 mg Intravenous Given 08/14/22 0923)  methylPREDNISolone sodium succinate (SOLU-MEDROL) 125 mg/2 mL injection 125 mg (125 mg Intravenous Given 08/14/22 1019)    FINAL CLINICAL IMPRESSION(S) / ED DIAGNOSES   Final diagnoses:  Rectal bleeding  COPD exacerbation (HCC)     Rx / DC Orders   ED Discharge Orders          Ordered    predniSONE (DELTASONE) 50 MG tablet  Daily with breakfast        08/14/22 1012    doxycycline (ADOXA) 100 MG tablet  2 times daily        08/14/22 1012    omeprazole (PRILOSEC OTC) 20 MG tablet  Daily        08/14/22 1012    albuterol (VENTOLIN HFA) 108 (90 Base) MCG/ACT inhaler  Every 4 hours PRN        08/14/22 1013             Note:  This document was prepared using Dragon voice recognition software and may include unintentional dictation errors.   Corena Herter, MD 08/14/22 1027

## 2022-09-02 NOTE — Progress Notes (Unsigned)
PCP:  Alease Medina, MD   No chief complaint on file.    HPI:      [name] is a 42 y.o. No obstetric history on file. whose LMP was No LMP recorded. Patient has had an ablation., presents today for her NP annual examination.  Her menses are {norm/abn:715}, lasting {number: 22536} days.  Dysmenorrhea {dysmen:716}. She {does:18564} have intermenstrual bleeding.  Sex activity: {sex active: 315163}.  Last Pap: {QMVH:846962952}  Results were: {norm/abn:16707::"no abnormalities"} /neg HPV DNA *** Hx of STDs: {STD hx:14358}  Last mammogram: {date:304500300}  Results were: {norm/abn:13465} There is no FH of breast cancer. There is no FH of ovarian cancer. The patient {does:18564} do self-breast exams.  Tobacco use: {tob:20664} Alcohol use: {Alcohol:11675} No drug use.  Exercise: {exercise:31265}  She {does:18564} get adequate calcium and Vitamin D in her diet.  Patient Active Problem List   Diagnosis Date Noted   Polysubstance abuse (HCC) 05/09/2022   Substance induced mood disorder (HCC) 05/09/2022   Cocaine use disorder, moderate, dependence (HCC) 05/09/2022   Passive suicidal ideations 05/09/2022   Chest pain 11/24/2021   Leukocytosis 11/24/2021   NSTEMI (non-ST elevated myocardial infarction) (HCC) 11/24/2021   Tobacco abuse 11/24/2021   Gastric ulcer 11/24/2021   Alcohol use 11/24/2021   Gastroenteritis 11/24/2021   Bipolar I disorder, most recent episode depressed (HCC)    Alcohol use disorder, severe, dependence (HCC)    Cannabis use disorder, severe, dependence (HCC)     Past Surgical History:  Procedure Laterality Date   BREAST ENHANCEMENT SURGERY     CESAREAN SECTION     LEFT HEART CATH AND CORONARY ANGIOGRAPHY N/A 11/24/2021   Procedure: LEFT HEART CATH AND CORONARY ANGIOGRAPHY;  Surgeon: Iran Ouch, MD;  Location: [facility] INVASIVE CV LAB;  Service: Cardiovascular;  Laterality: N/A;   TUBAL LIGATION      Family History  Problem Relation Age of  Onset   Healthy Mother    Other Father        unknown medical history    Social History   Socioeconomic History   Marital status: Divorced    Spouse name: Not on file   Number of children: Not on file   Years of education: Not on file   Highest education level: Not on file  Occupational History   Not on file  Tobacco Use   Smoking status: Some Days    Types: Cigarettes   Smokeless tobacco: Never  Vaping Use   Vaping status: Every Day  Substance and Sexual Activity   Alcohol use: Not Currently    Alcohol/week: 6.0 standard drinks of alcohol    Types: 6 Cans of beer per week    Comment: Reports stopping 3 weeks ago 7/26   Drug use: Yes    Frequency: 2.0 times per week    Types: Marijuana   Sexual activity: Not on file  Other Topics Concern   Not on file  Social History Narrative   Not on file   Social Determinants of Health   Financial Resource Strain: Not on file  Food Insecurity: Food Insecurity Present (05/09/2022)   Hunger Vital Sign    Worried About Running Out of Food in the Last Year: Sometimes true    Ran Out of Food in the Last Year: Sometimes true  Transportation Needs: No Transportation Needs (05/09/2022)   PRAPARE - Administrator, Civil Service (Medical): No    Lack of Transportation (Non-Medical): No  Physical Activity: Not  on file  Stress: Not on file  Social Connections: Not on file  Intimate Partner Violence: Not At Risk (05/09/2022)   Humiliation, Afraid, Rape, and Kick questionnaire    Fear of Current or Ex-Partner: No    Emotionally Abused: No    Physically Abused: No    Sexually Abused: No     Current Outpatient Medications:    albuterol (VENTOLIN HFA) 108 (90 Base) MCG/ACT inhaler, Inhale 4 puffs into the lungs every 4 (four) hours as needed for wheezing or shortness of breath., Disp: 8 g, Rfl: 0   BIOTIN PO, Take 1 tablet by mouth daily., Disp: , Rfl:    calcium-vitamin D (OSCAL WITH D) 500-5 MG-MCG tablet, Take 2 tablets by  mouth daily with breakfast., Disp: , Rfl:    folic acid (FOLVITE) 1 MG tablet, Take 1 tablet (1 mg total) by mouth daily., Disp: 90 tablet, Rfl: 1   HYDROcodone-acetaminophen (NORCO/VICODIN) 5-325 MG tablet, Take 1 tablet by mouth every 4 (four) hours as needed. (Patient not taking: Reported on 08/14/2022), Disp: 15 tablet, Rfl: 0   Multiple Vitamin (MULTIVITAMIN WITH MINERALS) TABS tablet, Take 1 tablet by mouth daily., Disp: 90 tablet, Rfl: 1   nicotine (NICODERM CQ - DOSED IN MG/24 HOURS) 21 mg/24hr patch, Place 1 patch (21 mg total) onto the skin daily., Disp: 28 patch, Rfl: 0   omeprazole (PRILOSEC OTC) 20 MG tablet, Take 2 tablets (40 mg total) by mouth daily., Disp: 180 tablet, Rfl: 3   ondansetron (ZOFRAN-ODT) 4 MG disintegrating tablet, Take 1 tablet (4 mg total) by mouth every 8 (eight) hours as needed for nausea or vomiting., Disp: 20 tablet, Rfl: 0   pantoprazole (PROTONIX) 40 MG tablet, Take 1 tablet (40 mg total) by mouth daily., Disp: 30 tablet, Rfl: 1   sucralfate (CARAFATE) 1 g tablet, Take 1 tablet (1 g total) by mouth 4 (four) times daily., Disp: 60 tablet, Rfl: 1   thiamine (VITAMIN B-1) 100 MG tablet, Take 1 tablet (100 mg total) by mouth daily., Disp: 90 tablet, Rfl: 1   traZODone (DESYREL) 50 MG tablet, Take 0.5 tablets (25 mg total) by mouth at bedtime as needed for sleep. (Patient not taking: Reported on 08/14/2022), Disp: 15 tablet, Rfl: 0     ROS:  Review of Systems BREAST: No symptoms   Objective: There were no vitals taken for this visit.   OBGyn Exam  Results: No results found for this or any previous visit (from the past 24 hour(s)).  Assessment/Plan: No diagnosis found.  No orders of the defined types were placed in this encounter.            GYN counsel {counseling: 16159}     F/U  No follow-ups on file.  Jasmynn Pfalzgraf B. Janisha Bueso, PA-C 09/02/2022 7:23 PM

## 2022-09-03 ENCOUNTER — Encounter: Payer: Self-pay | Admitting: Obstetrics and Gynecology

## 2022-09-03 ENCOUNTER — Other Ambulatory Visit (HOSPITAL_COMMUNITY)
Admission: RE | Admit: 2022-09-03 | Discharge: 2022-09-03 | Disposition: A | Discharge status: Home / Self Care | Payer: MEDICAID | Source: Ambulatory Visit | Attending: Obstetrics and Gynecology | Admitting: Obstetrics and Gynecology

## 2022-09-03 ENCOUNTER — Ambulatory Visit: Payer: MEDICAID | Admitting: Obstetrics and Gynecology

## 2022-09-03 VITALS — BP 100/78 | Ht 69.5 in | Wt 207.0 lb

## 2022-09-03 DIAGNOSIS — N939 Abnormal uterine and vaginal bleeding, unspecified: Secondary | ICD-10-CM

## 2022-09-03 DIAGNOSIS — Z124 Encounter for screening for malignant neoplasm of cervix: Secondary | ICD-10-CM

## 2022-09-03 DIAGNOSIS — Z01419 Encounter for gynecological examination (general) (routine) without abnormal findings: Secondary | ICD-10-CM

## 2022-09-03 DIAGNOSIS — Z1151 Encounter for screening for human papillomavirus (HPV): Secondary | ICD-10-CM | POA: Diagnosis present

## 2022-09-03 DIAGNOSIS — Z01411 Encounter for gynecological examination (general) (routine) with abnormal findings: Secondary | ICD-10-CM | POA: Diagnosis not present

## 2022-09-03 DIAGNOSIS — Z1231 Encounter for screening mammogram for malignant neoplasm of breast: Secondary | ICD-10-CM

## 2022-09-03 DIAGNOSIS — N879 Dysplasia of cervix uteri, unspecified: Secondary | ICD-10-CM | POA: Diagnosis not present

## 2022-09-03 NOTE — Patient Instructions (Addendum)
I value your feedback and you entrusting us with your care. If you get a [facility] patient survey, I would appreciate you taking the time to let us know about your experience today. Thank you!  [facility] ([place])--[phone]  

## 2022-09-07 LAB — CYTOLOGY - PAP
Comment: NEGATIVE
Diagnosis: REACTIVE
High risk HPV: NEGATIVE

## 2022-09-08 ENCOUNTER — Other Ambulatory Visit: Payer: Self-pay

## 2022-09-08 ENCOUNTER — Other Ambulatory Visit: Payer: Self-pay | Admitting: Obstetrics and Gynecology

## 2022-09-08 ENCOUNTER — Telehealth: Payer: Self-pay

## 2022-09-08 ENCOUNTER — Telehealth: Payer: Self-pay | Admitting: Obstetrics and Gynecology

## 2022-09-08 DIAGNOSIS — A6004 Herpesviral vulvovaginitis: Secondary | ICD-10-CM

## 2022-09-08 DIAGNOSIS — A599 Trichomoniasis, unspecified: Secondary | ICD-10-CM | POA: Insufficient documentation

## 2022-09-08 MED ORDER — METRONIDAZOLE 500 MG PO TABS: ORAL_TABLET | ORAL | 0 refills | Status: DC

## 2022-09-08 NOTE — Progress Notes (Signed)
Opened in error. KW °

## 2022-09-08 NOTE — Telephone Encounter (Signed)
Pls call pt to find out how she takes valtrex...daily or as needed. She didn't have listed on her med list. Other Rx eRxd for pt and her partner. Ask her to read result note about f/u. Thx.

## 2022-09-08 NOTE — Telephone Encounter (Signed)
Done on separate msg.

## 2022-09-08 NOTE — Progress Notes (Signed)
Rx flagyl for trich on pap. Partner treated.

## 2022-09-08 NOTE — Telephone Encounter (Signed)
The patient is calling today requesting to speak with someone about results and prescriptions. Could you please review and contact the patient.

## 2022-09-08 NOTE — Telephone Encounter (Signed)
Patient walked into office inquiring to speak with staff in regards to recent lab results. Reviewed with patient results of her pap smear which was negative and HPV negative. Advised patient that cytology report showed that she has trichomonas infection. Patient is requesting that you prescribe medication for her and her partner and send to [facility] [address] Patient states that her partners name is Adelfa Koh and dob is 06/12/1980. Patient states that she has a history of herpes and would like a new prescription for Valtrex sent to [facility] [address]. Please advise. KW

## 2022-09-09 ENCOUNTER — Other Ambulatory Visit: Payer: Self-pay | Admitting: Licensed Practical Nurse

## 2022-09-09 DIAGNOSIS — A6000 Herpesviral infection of urogenital system, unspecified: Secondary | ICD-10-CM

## 2022-09-09 MED ORDER — VALACYCLOVIR HCL 500 MG PO TABS
500.0000 mg | ORAL_TABLET | Freq: Two times a day (BID) | ORAL | 3 refills | Status: AC
Start: 2022-09-09 — End: 2022-09-12

## 2022-09-09 NOTE — Telephone Encounter (Signed)
Spoke with patient and advised her of message below she states that she was taking Valtrex previously as needed, patient states that she had experienced her first genital outbreak last week. Patient request prescription for Valtrex be [facility] to [facility] [address]. Rebecca Duffy is out of office can you please review. Thank you. KW

## 2022-10-05 MED ORDER — VALACYCLOVIR HCL 500 MG PO TABS
500.0000 mg | ORAL_TABLET | Freq: Every day | ORAL | 3 refills | Status: AC
Start: 2022-10-05 — End: ?

## 2022-10-05 NOTE — Telephone Encounter (Signed)
Rx valtrex to take daily eRxd

## 2022-10-05 NOTE — Telephone Encounter (Signed)
Patient requesting daily dosing for prevention. She states she is having too many break outs to take prn. She will call us back if she doesn't receive notification from pharmacy that rx is filled by 4 pm.

## 2022-10-05 NOTE — Telephone Encounter (Signed)
Patient states she is having another HSV break out. She is requesting a monthly rx for valtrex. She only received an episodic rx last month.

## 2022-10-05 NOTE — Telephone Encounter (Signed)
Pt was given 3 RF by Isabelle Course so can get Rx at pharm for current outbreak. Does she want to cont prn dosing vs daily dosing for prevention?

## 2022-10-06 ENCOUNTER — Other Ambulatory Visit: Payer: MEDICAID

## 2022-10-13 ENCOUNTER — Encounter: Payer: Self-pay | Admitting: Obstetrics and Gynecology

## 2022-10-14 ENCOUNTER — Telehealth: Payer: Self-pay | Admitting: Cardiovascular Disease

## 2022-10-14 NOTE — Telephone Encounter (Signed)
Pt returning call for lab results

## 2022-10-14 NOTE — Telephone Encounter (Signed)
Left message to call back  

## 2022-10-20 ENCOUNTER — Ambulatory Visit: Payer: Medicaid Other | Admitting: Cardiovascular Disease

## 2022-10-21 NOTE — Telephone Encounter (Signed)
Left message to call back. Do not see any recent labs from this office.

## 2022-10-30 ENCOUNTER — Other Ambulatory Visit: Payer: Self-pay

## 2022-10-30 ENCOUNTER — Emergency Department
Admission: EM | Admit: 2022-10-30 | Discharge: 2022-10-30 | Disposition: A | Discharge status: Home / Self Care | Payer: MEDICAID | Attending: Emergency Medicine | Admitting: Emergency Medicine

## 2022-10-30 DIAGNOSIS — J209 Acute bronchitis, unspecified: Secondary | ICD-10-CM | POA: Insufficient documentation

## 2022-10-30 DIAGNOSIS — Z1152 Encounter for screening for COVID-19: Secondary | ICD-10-CM | POA: Insufficient documentation

## 2022-10-30 DIAGNOSIS — R059 Cough, unspecified: Secondary | ICD-10-CM | POA: Diagnosis present

## 2022-10-30 DIAGNOSIS — J449 Chronic obstructive pulmonary disease, unspecified: Secondary | ICD-10-CM | POA: Diagnosis not present

## 2022-10-30 LAB — SARS CORONAVIRUS 2 BY RT PCR: SARS Coronavirus 2 by RT PCR: NEGATIVE

## 2022-10-30 MED ORDER — IPRATROPIUM-ALBUTEROL 0.5-2.5 (3) MG/3ML IN SOLN
3.0000 mL | Freq: Once | RESPIRATORY_TRACT | Status: AC
  Administered 2022-10-30: 3 mL via RESPIRATORY_TRACT
  Filled 2022-10-30: qty 3

## 2022-10-30 MED ORDER — METHYLPREDNISOLONE 4 MG PO TBPK: ORAL_TABLET | ORAL | 0 refills | Status: DC

## 2022-10-30 MED ORDER — PSEUDOEPH-BROMPHEN-DM 30-2-10 MG/5ML PO SYRP: 5.0000 mL | ORAL_SOLUTION | Freq: Four times a day (QID) | ORAL | 0 refills | Status: DC | PRN

## 2022-10-30 MED ORDER — AZITHROMYCIN 250 MG PO TABS: ORAL_TABLET | ORAL | 0 refills | Status: DC

## 2022-10-30 MED ORDER — ALBUTEROL SULFATE HFA 108 (90 BASE) MCG/ACT IN AERS: 2.0000 | INHALATION_SPRAY | Freq: Four times a day (QID) | RESPIRATORY_TRACT | 2 refills | Status: DC | PRN

## 2022-10-30 NOTE — ED Notes (Signed)
See triage note  Presents with some SOB,cough subjective fever for couple of days  HX of COPD  but is out of Advair  Has been using albuterol inhaler with min relief

## 2022-10-30 NOTE — ED Triage Notes (Addendum)
Pt comes with c/o cough and fever for few days. Pt states hx of COPD also.

## 2022-10-30 NOTE — ED Provider Notes (Signed)
[facility] Provider Note    Event Date/Time   First MD Initiated Contact with Patient 10/30/22 [phone]     (approximate)   History   Cough   HPI  Rebecca Duffy is a 42 y.o. female with history of COPD presents emergency department with complaints of cough, congestion, fever and wheezing for 2 to 3 days..  Family members sick.  States she is out of her inhaler.  Denies chest pain.  Denies vomiting or diarrhea.      Physical Exam   Triage Vital Signs: ED Triage Vitals  Encounter Vitals Group     BP 10/30/22 0726 (!) 115/105     Systolic BP Percentile --      Diastolic BP Percentile --      Pulse Rate 10/30/22 0726 (!) 101     Resp 10/30/22 0726 (!) 22     Temp 10/30/22 0726 98.3 F (36.8 C)     Temp Source 10/30/22 0726 Oral     SpO2 10/30/22 0726 95 %     Weight 10/30/22 0720 206 lb (93.4 kg)     Height 10/30/22 0720 5\' 9"  (1.753 m)     Head Circumference --      Peak Flow --      Pain Score 10/30/22 0719 4     Pain Loc --      Pain Education --      Exclude from Growth Chart --     Most recent vital signs: Vitals:   10/30/22 0726  BP: (!) 115/105  Pulse: (!) 101  Resp: (!) 22  Temp: 98.3 F (36.8 C)  SpO2: 95%     General: Awake, no distress.   CV:  Good peripheral perfusion. regular rate and  rhythm Resp:  Normal effort. Lungs with wheezing in all lung fields, patient is able to speak in full sentences Abd:  No distention.   Other:      ED Results / Procedures / Treatments   Labs (all labs ordered are listed, but only abnormal results are displayed) Labs Reviewed  SARS CORONAVIRUS 2 BY RT PCR     EKG     RADIOLOGY     PROCEDURES:   Procedures   MEDICATIONS ORDERED IN ED: Medications  ipratropium-albuterol (DUONEB) 0.5-2.5 (3) MG/3ML nebulizer solution 3 mL (3 mLs Nebulization Given 10/30/22 0758)     IMPRESSION / MDM / ASSESSMENT AND PLAN / ED COURSE  I reviewed the triage vital signs and the  nursing notes.                              Differential diagnosis includes, but is not limited to, COPD exasperation, acute bronchitis, COVID, influenza  Patient's presentation is most consistent with acute illness / injury with system symptoms.   COVID test ordered, DuoNeb ordered for wheezing   COVID test reassuring  Patient had relief with DuoNeb.  Wheezing improved, good air movement.  Due to the patient's history of COPD will refill her inhaler, start her on a Medrol Dosepak, Z-Pak, and Bromfed cough medication.  She is to follow-up with her regular doctor if not improving in 2 to 3 days.  Return if worsening.  She is in agreement treatment plan.  Discharged stable condition.   FINAL CLINICAL IMPRESSION(S) / ED DIAGNOSES   Final diagnoses:  Acute bronchitis, unspecified organism     Rx / DC Orders   ED Discharge Orders  Ordered    azithromycin (ZITHROMAX Z-PAK) 250 MG tablet        10/30/22 0843    methylPREDNISolone (MEDROL DOSEPAK) 4 MG TBPK tablet        10/30/22 0843    brompheniramine-pseudoephedrine-DM 30-2-10 MG/5ML syrup  4 times daily PRN        10/30/22 0843    albuterol (VENTOLIN HFA) 108 (90 Base) MCG/ACT inhaler  Every 6 hours PRN        10/30/22 0843             Note:  This document was prepared using Dragon voice recognition software and may include unintentional dictation errors.    Faythe Ghee, PA-C 10/30/22 1610    Concha Se, MD 10/30/22 [phone]

## 2022-11-20 NOTE — Progress Notes (Unsigned)
Cardiology Office Note    Date:  11/26/2022   ID:  Rebecca Duffy, DOB 07-18-1980, MRN [id number]  PCP:  Alease Medina, MD  Cardiologist:  Lorine Bears, MD  Electrophysiologist:  None   Chief Complaint: Follow-up  History of Present Illness:   Rebecca Duffy is a 42 y.o. female with history of normal coronary arteries by LHC in 11/2021, bipolar disorder, PTSD, OCD, ADHD, alcohol, tobacco, and cannabinoid use who presents for hospital follow-up after admission in 11/2021.  She was admitted to [facility] in 11/2021 with myocarditis.  Echo showed an EF of 60 to 65%, no regional wall motion abnormalities, normal LV diastolic function parameters, normal RV systolic function and ventricular cavity size, no significant valvular abnormalities, and an estimated right atrial pressure of 3 mmHg.  LHC during the admission showed normal coronary arteries with mildly elevated LVEDP.  It was felt the patient presentation was likely due to mild myocarditis with recommendation for the patient today colchicine for 1 week.  She has been lost to cardiology follow-up since.  She has since been evaluated in the ER multiple times including for hematochezia prior recommendation for GI evaluation.  During these ED evaluations she has had negative high-sensitivity troponins dating back to 12/17/2021.  She comes in doing well from a cardiac perspective and is without symptoms of angina or cardiac decompensation.  No palpitations, presyncope, or syncope.  She does notice some vertigo at times if she rotates her head quickly.  She has cut out alcohol, tobacco, and cocaine.  She does continue to smoke marijuana.  Has changed her diet significantly.  Overall feels well and does not have any cardiac concerns at this time.   Labs independently reviewed: 07/2022 - Hgb 13.8, PLT 360, potassium 4.4, BUN 16, serum creatinine 0.7, albumin 4.2, AST/ALT normal 04/2022 - TSH normal, TC 247, TG 233, HDL 69, LDL 131, UDS positive for  cocaine, A1c 5.1, magnesium 2.3  Past Medical History:  Diagnosis Date   ADHD    Bipolar 1 disorder (HCC)    Gastric ulcer    OCD (obsessive compulsive disorder)    PTSD (post-traumatic stress disorder)    Vertigo     Past Surgical History:  Procedure Laterality Date   BREAST ENHANCEMENT SURGERY     CESAREAN SECTION     LEFT HEART CATH AND CORONARY ANGIOGRAPHY N/A 11/24/2021   Procedure: LEFT HEART CATH AND CORONARY ANGIOGRAPHY;  Surgeon: Iran Ouch, MD;  Location: [facility] INVASIVE CV LAB;  Service: Cardiovascular;  Laterality: N/A;   TUBAL LIGATION      Current Medications: Current Meds  Medication Sig   albuterol (VENTOLIN HFA) 108 (90 Base) MCG/ACT inhaler Inhale 2 puffs into the lungs every 6 (six) hours as needed for wheezing or shortness of breath.   BIOTIN PO Take 1 tablet by mouth daily.   meclizine (ANTIVERT) 25 MG tablet Take 25 mg by mouth 3 (three) times daily as needed.   pantoprazole (PROTONIX) 40 MG tablet Take 1 tablet (40 mg total) by mouth daily.   valACYclovir (VALTREX) 500 MG tablet Take 1 tablet (500 mg total) by mouth daily.    Allergies:   Latex, Penicillins, and Nsaids   Social History   Socioeconomic History   Marital status: Divorced    Spouse name: Not on file   Number of children: Not on file   Years of education: Not on file   Highest education level: Not on file  Occupational History   Not on file  Tobacco Use   Smoking status: Former    Types: Cigarettes   Smokeless tobacco: Never  Vaping Use   Vaping status: Never Used  Substance and Sexual Activity   Alcohol use: Not Currently    Alcohol/week: 6.0 standard drinks of alcohol    Types: 6 Cans of beer per week    Comment: Reports stopping 3 weeks ago 7/26   Drug use: Yes    Types: Marijuana   Sexual activity: Yes    Birth control/protection: Surgical    Comment: Tubal ligation  Other Topics Concern   Not on file  Social History Narrative   Not on file   Social  Determinants of Health   Financial Resource Strain: Not on file  Food Insecurity: Food Insecurity Present (05/09/2022)   Hunger Vital Sign    Worried About Running Out of Food in the Last Year: Sometimes true    Ran Out of Food in the Last Year: Sometimes true  Transportation Needs: No Transportation Needs (05/09/2022)   PRAPARE - Administrator, Civil Service (Medical): No    Lack of Transportation (Non-Medical): No  Physical Activity: Not on file  Stress: Not on file  Social Connections: Not on file     Family History:  The patient's family history includes Breast cancer (age of onset: 73) in her maternal grandmother; Healthy in her mother; Other in her father; Skin cancer in her maternal grandfather.  ROS:   12-point review of systems is negative unless otherwise noted in the HPI.   EKGs/Labs/Other Studies Reviewed:    Studies reviewed were summarized above. The additional studies were reviewed today:  LHC 11/24/2021: 1.  Normal coronary arteries. 2.  Left ventricular angiography was not performed EF was normal by echo.  Mildly elevated left ventricular end-diastolic pressure.   Recommendations: Suspect that the patient's presentation is likely due to mild myocarditis.  Based on cardiac catheterization, this was not a myocardial infarction.  No need for a statin beta-blocker or cardiac rehab. I added colchicine to be used for 1 week. Suspect that the patient can likely be discharged home tomorrow if no other issues. __________  2D echo 11/24/2021: 1. Left ventricular ejection fraction, by estimation, is 60 to 65%. The  left ventricle has normal function. The left ventricle has no regional  wall motion abnormalities. Left ventricular diastolic parameters were  normal.   2. Right ventricular systolic function is normal. The right ventricular  size is normal. Tricuspid regurgitation signal is inadequate for assessing  PA pressure.   3. The mitral valve is normal in  structure. No evidence of mitral valve  regurgitation. No evidence of mitral stenosis.   4. The aortic valve is normal in structure. Aortic valve regurgitation is  not visualized. No aortic stenosis is present.   5. The inferior vena cava is normal in size with greater than 50%  respiratory variability, suggesting right atrial pressure of 3 mmHg.    EKG:  EKG is ordered today.  The EKG ordered today demonstrates NSR, 85 bpm, no acute ST/T changes  Recent Labs: 05/09/2022: Magnesium 2.3; TSH 2.241 08/14/2022: ALT 18; BUN 16; Creatinine, Ser 0.70; Hemoglobin 13.8; Platelets 360; Potassium 4.4; Sodium 135  Recent Lipid Panel    Component Value Date/Time   CHOL 247 (H) 05/09/2022 1558   TRIG 233 (H) 05/09/2022 1558   HDL 69 05/09/2022 1558   CHOLHDL 3.6 05/09/2022 1558   VLDL 47 (H) 05/09/2022 1558   LDLCALC 131 (H) 05/09/2022 1558  PHYSICAL EXAM:    VS:  BP 120/70 (BP Location: Left Arm, Patient Position: Sitting, Cuff Size: Normal)   Pulse 85   Ht 5\' 9"  (1.753 m)   Wt 215 lb (97.5 kg)   SpO2 98%   BMI 31.75 kg/m   BMI: Body mass index is 31.75 kg/m.  Physical Exam Vitals reviewed.  Constitutional:      Appearance: She is well-developed.  HENT:     Head: Normocephalic and atraumatic.  Eyes:     General:        Right eye: No discharge.        Left eye: No discharge.  Neck:     Vascular: No JVD.  Cardiovascular:     Rate and Rhythm: Normal rate and regular rhythm.     Pulses:          Dorsalis pedis pulses are 2+ on the right side and 2+ on the left side.       Posterior tibial pulses are 2+ on the right side and 2+ on the left side.     Heart sounds: Normal heart sounds, S1 normal and S2 normal. Heart sounds not distant. No midsystolic click and no opening snap. No murmur heard.    No friction rub.  Pulmonary:     Effort: Pulmonary effort is normal. No respiratory distress.     Breath sounds: Normal breath sounds. No decreased breath sounds, wheezing, rhonchi or  rales.  Chest:     Chest wall: No tenderness.  Abdominal:     General: There is no distension.  Musculoskeletal:     Cervical back: Normal range of motion.     Right lower leg: No edema.     Left lower leg: No edema.  Skin:    General: Skin is warm and dry.     Nails: There is no clubbing.  Neurological:     Mental Status: She is alert and oriented to person, place, and time.  Psychiatric:        Speech: Speech normal.        Behavior: Behavior normal.        Thought Content: Thought content normal.        Judgment: Judgment normal.     Wt Readings from Last 3 Encounters:  11/26/22 215 lb (97.5 kg)  10/30/22 206 lb (93.4 kg)  09/03/22 207 lb (93.9 kg)     ASSESSMENT & PLAN:   History of myocarditis: Resolved.  LHC showed no evidence of CAD.  No evidence of cardiomyopathy on echo.  No indication for further cardiac testing at this time.  Recommend aggressive risk factor modification and primary prevention through lifestyle modification.  History of polysubstance use: Has quit alcohol, tobacco, and cocaine.  Continues to smoke marijuana.  Abstinence is encouraged.      Disposition: F/u with Dr. Kirke Corin or an APP in 12 months.   Medication Adjustments/Labs and Tests Ordered: Current medicines are reviewed at length with the patient today.  Concerns regarding medicines are outlined above. Medication changes, Labs and Tests ordered today are summarized above and listed in the Patient Instructions accessible in Encounters.   Signed, Eula Listen, PA-C 11/26/2022 12:32 PM     [facility] [place] [address] [place], [place] [address] [phone]

## 2022-11-26 ENCOUNTER — Ambulatory Visit: Payer: MEDICAID | Attending: Cardiovascular Disease | Admitting: Physician Assistant

## 2022-11-26 ENCOUNTER — Encounter: Payer: Self-pay | Admitting: Physician Assistant

## 2022-11-26 VITALS — BP 120/70 | HR 85 | Ht 69.0 in | Wt 215.0 lb

## 2022-11-26 DIAGNOSIS — I401 Isolated myocarditis: Secondary | ICD-10-CM

## 2022-11-26 NOTE — Patient Instructions (Signed)
Medication Instructions:  Your physician recommends that you continue on your current medications as directed. Please refer to the Current Medication list given to you today.  *If you need a refill on your cardiac medications before your next appointment, please call your pharmacy*  Follow-Up: At [facility], you and your health needs are our priority.  As part of our continuing mission to provide you with exceptional heart care, we have created designated Provider Care Teams.  These Care Teams include your primary Cardiologist (physician) and Advanced Practice Providers (APPs -  Physician Assistants and Nurse Practitioners) who all work together to provide you with the care you need, when you need it.  Your next appointment:   1 year  Provider:   You may see Lorine Bears, MD or one of the following Advanced Practice Providers on your designated Care Team:   Nicolasa Ducking, NP Eula Listen, PA-C Cadence Fransico Michael, PA-C Charlsie Quest, NP Carlos Levering, NP

## 2023-01-04 IMAGING — NM NM HEPATO W/GB/PHARM/[PERSON_NAME]
2 series · 12 of 12 positions shown · non-contrast
Comparison: Same day abdominal ultrasound

CLINICAL DATA: Chronic right-sided abdominal pain.

EXAM:
NUCLEAR MEDICINE HEPATOBILIARY IMAGING WITH GALLBLADDER EF
TECHNIQUE: Sequential images of the abdomen were obtained [DATE] minutes
following intravenous administration of radiopharmaceutical. After
oral ingestion of Ensure, gallbladder ejection fraction was
determined. At 60 min, normal ejection fraction is greater than 33%.
RADIOPHARMACEUTICALS:  5.3 mCi 0c-MMm  Choletec IV

[Series 1000: gallbladder ef · 4.80mm/px · 6 of 120 frames shown]
[frame 11/120]
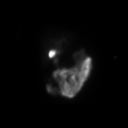
[frame 31/120]
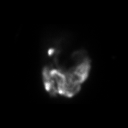
[frame 51/120]
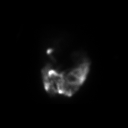
[frame 71/120]
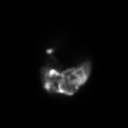
[frame 91/120]
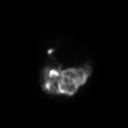
[frame 111/120]
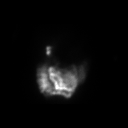

[Series 1000: hepatobiliary scan · 9.59mm/px · 6 of 60 frames shown]
[frame 6/60]
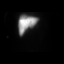
[frame 16/60]
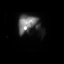
[frame 26/60]
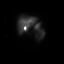
[frame 36/60]
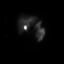
[frame 46/60]
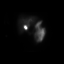
[frame 56/60]
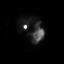

[12 of 12 positions shown; findings below may reference images not displayed]

FINDINGS: Prompt uptake and biliary excretion of activity by the liver is
seen. Gallbladder activity is visualized, consistent with patency of
cystic duct. Biliary activity passes into small bowel, consistent
with patent common bile duct.

Calculated gallbladder ejection fraction is 74%%. (Normal
gallbladder ejection fraction with Ensure is greater than 33%.)

Scintigraphic evidence of enterogastric biliary reflux.
IMPRESSION: 1.  Patent cystic and common bile ducts.

2.  Normal gallbladder ejection fraction.

3. Scintigraphic evidence of enterogastric biliary reflux which can
be a cause of abdominal pain.

## 2023-01-31 ENCOUNTER — Ambulatory Visit
Admission: EM | Admit: 2023-01-31 | Discharge: 2023-01-31 | Disposition: A | Discharge status: Home / Self Care | Payer: MEDICAID | Attending: Emergency Medicine | Admitting: Emergency Medicine

## 2023-01-31 DIAGNOSIS — H6692 Otitis media, unspecified, left ear: Secondary | ICD-10-CM | POA: Diagnosis not present

## 2023-01-31 LAB — POCT RAPID STREP A (OFFICE): Rapid Strep A Screen: NEGATIVE

## 2023-01-31 MED ORDER — AZITHROMYCIN 250 MG PO TABS: 250.0000 mg | ORAL_TABLET | Freq: Every day | ORAL | 0 refills | Status: DC

## 2023-01-31 NOTE — ED Provider Notes (Signed)
 Rebecca Duffy    CSN: [id number] Arrival date & time: 01/31/23  9182      History   Chief Complaint Chief Complaint  Patient presents with   Sore Throat    HPI Rebecca Duffy is a 43 y.o. female.  Accompanied by her daughter, patient presents with left side sore throat, left ear pain, numbness on the left side of her face, and fluid under her chin x 1 day.  No fever, difficulty swallowing, shortness of breath, cough.  No OTC treatments taken.  Her medical history includes polysubstance abuse, bipolar 1 disorder, NSTEMI.   The history is provided by the patient and medical records.    Past Medical History:  Diagnosis Date   ADHD    Bipolar 1 disorder (HCC)    Gastric ulcer    OCD (obsessive compulsive disorder)    PTSD (post-traumatic stress disorder)    Vertigo     Patient Active Problem List   Diagnosis Date Noted   Trichomoniasis 09/08/2022   Cervical dysplasia 09/03/2022   Polysubstance abuse (HCC) 05/09/2022   Substance induced mood disorder (HCC) 05/09/2022   Cocaine use disorder, moderate, dependence (HCC) 05/09/2022   Passive suicidal ideations 05/09/2022   Chest pain 11/24/2021   Leukocytosis 11/24/2021   NSTEMI (non-ST elevated myocardial infarction) (HCC) 11/24/2021   Tobacco abuse 11/24/2021   Gastric ulcer 11/24/2021   Alcohol use 11/24/2021   Gastroenteritis 11/24/2021   Bipolar I disorder, most recent episode depressed (HCC)    Alcohol use disorder, severe, dependence (HCC)    Cannabis use disorder, severe, dependence (HCC)     Past Surgical History:  Procedure Laterality Date   BREAST ENHANCEMENT SURGERY     CESAREAN SECTION     LEFT HEART CATH AND CORONARY ANGIOGRAPHY N/A 11/24/2021   Procedure: LEFT HEART CATH AND CORONARY ANGIOGRAPHY;  Surgeon: Darron Deatrice LABOR, MD;  Location: [facility] INVASIVE CV LAB;  Service: Cardiovascular;  Laterality: N/A;   TUBAL LIGATION      OB History     Gravida  10   Para  2   Term  1   Preterm   1   AB  8   Living  2      SAB  2   IAB  6   Ectopic      Multiple      Live Births  2            Home Medications    Prior to Admission medications   Medication Sig Start Date End Date Taking? Authorizing Provider  Semaglutide,0.25 or 0.5MG /DOS, (OZEMPIC, 0.25 OR 0.5 MG/DOSE,) 2 MG/1.5ML SOPN Inject 0.5 mg into the skin.   Yes [provider]  albuterol  (VENTOLIN  HFA) 108 (90 Base) MCG/ACT inhaler Inhale 2 puffs into the lungs every 6 (six) hours as needed for wheezing or shortness of breath. 10/30/22   Fisher, Devere ORN, PA-C  azithromycin  (ZITHROMAX ) 250 MG tablet Take 1 tablet (250 mg total) by mouth daily. Take first 2 tablets together, then 1 every day until finished. 01/31/23  Yes Corlis Burnard DEL, NP  BIOTIN PO Take 1 tablet by mouth daily.    [provider]  brompheniramine-pseudoephedrine-DM 30-2-10 MG/5ML syrup Take 5 mLs by mouth 4 (four) times daily as needed. Patient not taking: Reported on 11/26/2022 10/30/22   Gasper Devere ORN, PA-C  meclizine  (ANTIVERT ) 25 MG tablet Take 25 mg by mouth 3 (three) times daily as needed. 06/17/22   [provider]  methylPREDNISolone  (MEDROL   DOSEPAK) 4 MG TBPK tablet Take 6 pills on day one then decrease by 1 pill each day Patient not taking: Reported on 11/26/2022 10/30/22   Gasper Devere ORN, PA-C  metroNIDAZOLE  (FLAGYL ) 500 MG tablet Take 2 tabs BID for 1 day Patient not taking: Reported on 11/26/2022 09/08/22   Copland, Bernarda NOVAK, PA-C  pantoprazole  (PROTONIX ) 40 MG tablet Take 1 tablet (40 mg total) by mouth daily. 11/25/21   Amin, Sumayya, MD  sucralfate  (CARAFATE ) 1 g tablet Take 1 tablet (1 g total) by mouth 4 (four) times daily. Patient not taking: Reported on 11/26/2022 07/22/22 08/21/22  Dorothyann Drivers, MD  traZODone  (DESYREL ) 50 MG tablet Take 0.5 tablets (25 mg total) by mouth at bedtime as needed for sleep. Patient not taking: Reported on 11/26/2022 05/11/22   Ezzard Staci SAILOR, NP  valACYclovir   (VALTREX ) 500 MG tablet Take 1 tablet (500 mg total) by mouth daily. 10/05/22   Copland, Bernarda NOVAK, PA-C    Family History Family History  Problem Relation Age of Onset   Healthy Mother    Other Father        unknown medical history   Breast cancer Maternal Grandmother 31   Skin cancer Maternal Grandfather        64s    Social History Social History   Tobacco Use   Smoking status: Former    Types: Cigarettes   Smokeless tobacco: Never  Vaping Use   Vaping status: Never Used  Substance Use Topics   Alcohol use: Not Currently    Alcohol/week: 6.0 standard drinks of alcohol    Types: 6 Cans of beer per week    Comment: Reports stopping 3 weeks ago 7/26   Drug use: Yes    Types: Marijuana     Allergies   Latex, Penicillins, and Nsaids   Review of Systems Review of Systems  Constitutional:  Negative for chills and fever.  HENT:  Positive for ear pain and sore throat. Negative for trouble swallowing.   Respiratory:  Negative for cough and shortness of breath.   Neurological:  Positive for numbness. Negative for facial asymmetry and weakness.     Physical Exam Triage Vital Signs ED Triage Vitals  Encounter Vitals Group     BP      Systolic BP Percentile      Diastolic BP Percentile      Pulse      Resp      Temp      Temp src      SpO2      Weight      Height      Head Circumference      Peak Flow      Pain Score      Pain Loc      Pain Education      Exclude from Growth Chart    No data found.  Updated Vital Signs BP 110/73   Pulse 91   Temp 98.4 F (36.9 C)   Resp 18   SpO2 98%   Visual Acuity Right Eye Distance:   Left Eye Distance:   Bilateral Distance:    Right Eye Near:   Left Eye Near:    Bilateral Near:     Physical Exam Vitals and nursing note reviewed.  Constitutional:      General: She is not in acute distress.    Appearance: She is well-developed.  HENT:     Head:     Jaw: No swelling.  Right Ear: Tympanic membrane  normal.     Left Ear: Tympanic membrane is erythematous.     Ears:     Comments: Left TM is mildly erythematous.    Nose: Nose normal.     Mouth/Throat:     Mouth: Mucous membranes are moist.     Pharynx: Oropharynx is clear.  Cardiovascular:     Rate and Rhythm: Normal rate and regular rhythm.     Heart sounds: Normal heart sounds.  Pulmonary:     Effort: Pulmonary effort is normal. No respiratory distress.     Breath sounds: Normal breath sounds.  Musculoskeletal:     Cervical back: Normal range of motion and neck supple. No rigidity.  Lymphadenopathy:     Cervical: No cervical adenopathy.  Skin:    General: Skin is warm and dry.  Neurological:     General: No focal deficit present.     Mental Status: She is alert and oriented to person, place, and time.     Cranial Nerves: No cranial nerve deficit.     Sensory: No sensory deficit.     Motor: No weakness.     Gait: Gait normal.      UC Treatments / Results  Labs (all labs ordered are listed, but only abnormal results are displayed) Labs Reviewed  POCT RAPID STREP A (OFFICE)    EKG   Radiology No results found.  Procedures Procedures (including critical care time)  Medications Ordered in UC Medications - No data to display  Initial Impression / Assessment and Plan / UC Course  I have reviewed the triage vital signs and the nursing notes.  Pertinent labs & imaging results that were available during my care of the patient were reviewed by me and considered in my medical decision making (see chart for details).    Left otitis media.  No facial swelling.  Sensation intact.  Afebrile and vital signs are stable.  Treating today with Zithromax .  Instructed her to follow-up with her PCP tomorrow.  ED precautions given.  She agrees to plan of care.  Final Clinical Impressions(s) / UC Diagnoses   Final diagnoses:  Left otitis media, unspecified otitis media type     Discharge Instructions      Take the  Zithromax  as directed.  Follow up with your primary care provider tomorrow.  Go to the emergency department if you have worsening symptoms.        ED Prescriptions     Medication Sig Dispense Auth. Provider   azithromycin  (ZITHROMAX ) 250 MG tablet Take 1 tablet (250 mg total) by mouth daily. Take first 2 tablets together, then 1 every day until finished. 6 tablet Corlis Burnard DEL, NP      PDMP not reviewed this encounter.   Corlis Burnard DEL, NP 01/31/23 [phone]

## 2023-01-31 NOTE — ED Triage Notes (Signed)
 Patient to Urgent Care with feeling as though she has numbness behind her eye/ sinuses/ ear on the left side of her face. Pain on the left side of her throat when she swallows. "Fluid" under her chin.  Symptoms started yesterday. Denies any known fevers.

## 2023-01-31 NOTE — Discharge Instructions (Addendum)
 Take the Zithromax as directed.  Follow up with your primary care provider tomorrow.  Go to the emergency department if you have worsening symptoms.

## 2023-02-14 ENCOUNTER — Emergency Department: Payer: MEDICAID

## 2023-02-14 ENCOUNTER — Other Ambulatory Visit: Payer: Self-pay

## 2023-02-14 ENCOUNTER — Emergency Department
Admission: EM | Admit: 2023-02-14 | Discharge: 2023-02-14 | Disposition: A | Discharge status: Home / Self Care | Payer: MEDICAID | Attending: Emergency Medicine | Admitting: Emergency Medicine

## 2023-02-14 DIAGNOSIS — K859 Acute pancreatitis without necrosis or infection, unspecified: Secondary | ICD-10-CM | POA: Insufficient documentation

## 2023-02-14 DIAGNOSIS — R079 Chest pain, unspecified: Secondary | ICD-10-CM | POA: Diagnosis not present

## 2023-02-14 DIAGNOSIS — M549 Dorsalgia, unspecified: Secondary | ICD-10-CM | POA: Diagnosis present

## 2023-02-14 LAB — HEPATIC FUNCTION PANEL
ALT: 15 U/L (ref 0–44)
AST: 16 U/L (ref 15–41)
Albumin: 3.8 g/dL (ref 3.5–5.0)
Alkaline Phosphatase: 61 U/L (ref 38–126)
Bilirubin, Direct: 0.2 mg/dL (ref 0.0–0.2)
Indirect Bilirubin: 1.3 mg/dL — ABNORMAL HIGH (ref 0.3–0.9)
Total Bilirubin: 1.5 mg/dL — ABNORMAL HIGH (ref 0.0–1.2)
Total Protein: 6.6 g/dL (ref 6.5–8.1)

## 2023-02-14 LAB — CBC
HCT: 39.5 % (ref 36.0–46.0)
Hemoglobin: 13.2 g/dL (ref 12.0–15.0)
MCH: 29.5 pg (ref 26.0–34.0)
MCHC: 33.4 g/dL (ref 30.0–36.0)
MCV: 88.2 fL (ref 80.0–100.0)
Platelets: 356 10*3/uL (ref 150–400)
RBC: 4.48 MIL/uL (ref 3.87–5.11)
RDW: 12.3 % (ref 11.5–15.5)
WBC: 8.2 10*3/uL (ref 4.0–10.5)
nRBC: 0 % (ref 0.0–0.2)

## 2023-02-14 LAB — LIPASE, BLOOD: Lipase: 169 U/L — ABNORMAL HIGH (ref 11–51)

## 2023-02-14 LAB — BASIC METABOLIC PANEL
Anion gap: 8 (ref 5–15)
BUN: 14 mg/dL (ref 6–20)
CO2: 21 mmol/L — ABNORMAL LOW (ref 22–32)
Calcium: 8.6 mg/dL — ABNORMAL LOW (ref 8.9–10.3)
Chloride: 105 mmol/L (ref 98–111)
Creatinine, Ser: 0.59 mg/dL (ref 0.44–1.00)
GFR, Estimated: >60 mL/min (ref >60)
Glucose, Bld: 87 mg/dL (ref 70–99)
Potassium: 4.1 mmol/L (ref 3.5–5.1)
Sodium: 134 mmol/L — ABNORMAL LOW (ref 135–145)

## 2023-02-14 LAB — TROPONIN I (HIGH SENSITIVITY): Troponin I (High Sensitivity): <2 ng/L (ref <18)

## 2023-02-14 MED ORDER — IOHEXOL 350 MG/ML SOLN
75.0000 mL | Freq: Once | INTRAVENOUS | Status: AC | PRN
  Administered 2023-02-14: 75 mL via INTRAVENOUS

## 2023-02-14 MED ORDER — OXYCODONE-ACETAMINOPHEN 5-325 MG PO TABS: 1.0000 | ORAL_TABLET | ORAL | 0 refills | Status: DC | PRN

## 2023-02-14 MED ORDER — OXYCODONE-ACETAMINOPHEN 5-325 MG PO TABS
1.0000 | ORAL_TABLET | Freq: Once | ORAL | Status: AC
  Administered 2023-02-14: 1 via ORAL
  Filled 2023-02-14: qty 1

## 2023-02-14 NOTE — ED Notes (Signed)
Ultrasound at bedside

## 2023-02-14 NOTE — ED Triage Notes (Addendum)
Pt to ed from home via POV for back pain that radiates into her ribcage. Pt has been doing some heavy lifting and raking. Pt states those pains are shooting into her chest. Pt is caox4, in no acute distress and ambulatory in triage. Pt also has been taking semiglutide and increased her dose and [place] curious if that is it or if she has pulled a muscle in her back.

## 2023-02-14 NOTE — ED Provider Notes (Signed)
[facility] Provider Note    Event Date/Time   First MD Initiated Contact with Patient 02/14/23 [phone]     (approximate)  History   Chief Complaint: Back Pain  HPI  Jonet Mathies is a 43 y.o. female with a past medical history of bipolar, presents to the emergency department for back pain.  According to the patient for the last 3 days or so she has been experiencing pain in her posterior chest that is worse with inspiration.  Patient states within the last couple days she did have an episode of fairly forceful nausea and vomiting.  She also states she has been shoveling gravel and thinks this could just be muscular pain.  However given the patient's pleuritic nature of the pain she was concerned so she came to the emergency department as she read that this could be a sign of a blood clot.  Patient denies any leg pain or swelling.  Denies any shortness of breath.  Reassuring vital signs.  Physical Exam   Triage Vital Signs: ED Triage Vitals [02/14/23 0637]  Encounter Vitals Group     BP 133/86     Systolic BP Percentile      Diastolic BP Percentile      Pulse Rate 99     Resp 16     Temp 97.8 F (36.6 C)     Temp Source Oral     SpO2 98 %     Weight      Height 5\' 9"  (1.753 m)     Head Circumference      Peak Flow      Pain Score 8     Pain Loc      Pain Education      Exclude from Growth Chart     Most recent vital signs: Vitals:   02/14/23 0637  BP: 133/86  Pulse: 99  Resp: 16  Temp: 97.8 F (36.6 C)  SpO2: 98%    General: Awake, no distress.  CV:  Good peripheral perfusion.  Regular rate and rhythm  Resp:  Normal effort.  Equal breath sounds bilaterally.  Abd:  No distention.  Soft, nontender.  No rebound or guarding. ED Results / Procedures / Treatments   RADIOLOGY  I reviewed and interpreted chest x-ray images.  No consolidation seen on my evaluation. Radiology has read the x-ray as negative.   EKG viewed and interpreted by  myself shows a normal sinus rhythm 86 bpm with a narrow QRS, normal axis, normal intervals, no concerning ST changes.  MEDICATIONS ORDERED IN ED: Medications  iohexol (OMNIPAQUE) 350 MG/ML injection 75 mL (75 mLs Intravenous Contrast Given 02/14/23 0820)     IMPRESSION / MDM / ASSESSMENT AND PLAN / ED COURSE  I reviewed the triage vital signs and the nursing notes.  Patient's presentation is most consistent with acute presentation with potential threat to life or bodily function.  Patient presents to the emergency department for pleuritic chest pain worse with inspiration or movement.  Patient's physical exam is reassuring, lab work is reassuring.  Patient's back discomfort is reproducible with palpation.  EKG is reassuring, CBC chemistry and troponin are negative.  I have added on a hepatic function panel and lipase as a precaution although the patient's pain tends to be more central in the mid back.  Given the pleuritic nature of the pain we will obtain a CTA to rule out pulmonary embolism or Boerhaave's given the patient's vomiting.  Patient denies any diarrhea  no abdominal pain.  Overall she is well-appearing.  CT is negative for PE.  Patient's lipase is elevated to 160 concerning for possible mild pancreatitis.  We will obtain a right upper quadrant ultrasound as a precaution.  Patient is on semaglutide which can be associated with pancreatitis as well.  Patient does not drink alcohol.  Ultrasound is negative for gallstones or CBD dilation.  Given the patient's reassuring workup we will place patient on pain medication, discussed pancreatitis eating plan and have her follow-up with her doctor.  Discussed return precautions as well for worsening pain.  FINAL CLINICAL IMPRESSION(S) / ED DIAGNOSES   Chest pain Pancreatitis  Note:  This document was prepared using Dragon voice recognition software and may include unintentional dictation errors.   Minna Antis, MD 02/14/23 [phone]

## 2023-04-30 ENCOUNTER — Encounter: Payer: Self-pay | Admitting: Emergency Medicine

## 2023-04-30 ENCOUNTER — Ambulatory Visit
Admission: EM | Admit: 2023-04-30 | Discharge: 2023-04-30 | Disposition: A | Discharge status: Home / Self Care | Payer: MEDICAID | Attending: Emergency Medicine | Admitting: Emergency Medicine

## 2023-04-30 ENCOUNTER — Other Ambulatory Visit: Payer: Self-pay

## 2023-04-30 DIAGNOSIS — J441 Chronic obstructive pulmonary disease with (acute) exacerbation: Secondary | ICD-10-CM | POA: Diagnosis not present

## 2023-04-30 MED ORDER — AZITHROMYCIN 250 MG PO TABS: 250.0000 mg | ORAL_TABLET | Freq: Every day | ORAL | 0 refills | Status: DC

## 2023-04-30 MED ORDER — IPRATROPIUM-ALBUTEROL 0.5-2.5 (3) MG/3ML IN SOLN
3.0000 mL | Freq: Once | RESPIRATORY_TRACT | Status: AC
  Administered 2023-04-30: 3 mL via RESPIRATORY_TRACT

## 2023-04-30 MED ORDER — ALBUTEROL SULFATE HFA 108 (90 BASE) MCG/ACT IN AERS: 2.0000 | INHALATION_SPRAY | Freq: Four times a day (QID) | RESPIRATORY_TRACT | 2 refills | Status: DC | PRN

## 2023-04-30 MED ORDER — PREDNISONE 10 MG (21) PO TBPK: ORAL_TABLET | Freq: Every day | ORAL | 0 refills | Status: DC

## 2023-04-30 NOTE — ED Triage Notes (Signed)
 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  Has used her inhaler all day yesterday with only momentary relief.  She's used her inhaler twice in the past 6 hours without relief.

## 2023-04-30 NOTE — Discharge Instructions (Signed)
 Take azithromycin as directed to provide protection to the airway  Begin prednisone every morning with food to open and relax airway making it easier to breathe  You have been given a breathing treatment here in the clinic to help relax the lungs, may continue use of inhaler at home  You can take Tylenol and/or Ibuprofen as needed for fever reduction and pain relief.   For cough: honey 1/2 to 1 teaspoon (you can dilute the honey in water or another fluid).  You can also use guaifenesin and dextromethorphan for cough. You can use a humidifier for chest congestion and cough.  If you don't have a humidifier, you can sit in the bathroom with the hot shower running.      For sore throat: try warm salt water gargles, cepacol lozenges, throat spray, warm tea or water with lemon/honey, popsicles or ice, or OTC cold relief medicine for throat discomfort.   For congestion: take a daily anti-histamine like Zyrtec, Claritin, and a oral decongestant, such as pseudoephedrine.  You can also use Flonase 1-2 sprays in each nostril daily.   It is important to stay hydrated: drink plenty of fluids (water, gatorade/powerade/pedialyte, juices, or teas) to keep your throat moisturized and help further relieve irritation/discomfort.  If your breathing worsens at any point please go to the nearest emergency department for evaluation

## 2023-04-30 NOTE — ED Provider Notes (Signed)
 Renaldo Fiddler    CSN: [id number] Arrival date & time: 04/30/23  1435      History   Chief Complaint Chief Complaint  Patient presents with   URI    HPI Rebecca Duffy is a 43 y.o. female.   Patient presents for evaluation of subjective fever, chills, nasal congestion, rhinorrhea, sore throat, chest congestion, shortness of breath at rest and intermittent left sharp ear pains present for 2 days.  No known sick contacts.  Poor appetite but able to tolerate food and liquids.  Has an albuterol inhaler which had been helpful until today.  History of COPD.  Past Medical History:  Diagnosis Date   ADHD    Bipolar 1 disorder (HCC)    Gastric ulcer    OCD (obsessive compulsive disorder)    PTSD (post-traumatic stress disorder)    Vertigo     Patient Active Problem List   Diagnosis Date Noted   Trichomoniasis 09/08/2022   Cervical dysplasia 09/03/2022   Polysubstance abuse (HCC) 05/09/2022   Substance induced mood disorder (HCC) 05/09/2022   Cocaine use disorder, moderate, dependence (HCC) 05/09/2022   Passive suicidal ideations 05/09/2022   Chest pain 11/24/2021   Leukocytosis 11/24/2021   NSTEMI (non-ST elevated myocardial infarction) (HCC) 11/24/2021   Tobacco abuse 11/24/2021   Gastric ulcer 11/24/2021   Alcohol use 11/24/2021   Gastroenteritis 11/24/2021   Bipolar I disorder, most recent episode depressed (HCC)    Alcohol use disorder, severe, dependence (HCC)    Cannabis use disorder, severe, dependence (HCC)     Past Surgical History:  Procedure Laterality Date   BREAST ENHANCEMENT SURGERY     CESAREAN SECTION     LEFT HEART CATH AND CORONARY ANGIOGRAPHY N/A 11/24/2021   Procedure: LEFT HEART CATH AND CORONARY ANGIOGRAPHY;  Surgeon: Iran Ouch, MD;  Location: [facility] INVASIVE CV LAB;  Service: Cardiovascular;  Laterality: N/A;   TUBAL LIGATION      OB History     Gravida  10   Para  2   Term  1   Preterm  1   AB  8   Living  2       SAB  2   IAB  6   Ectopic      Multiple      Live Births  2            Home Medications    Prior to Admission medications   Medication Sig Start Date End Date Taking? Authorizing Provider  azithromycin (ZITHROMAX) 250 MG tablet Take 1 tablet (250 mg total) by mouth daily. Take first 2 tablets together, then 1 every day until finished. 04/30/23  Yes Erhard Senske R, NP  predniSONE (STERAPRED UNI-PAK 21 TAB) 10 MG (21) TBPK tablet Take by mouth daily. Take 6 tabs by mouth daily  for 1 days, then 5 tabs for 1 days, then 4 tabs for 1 days, then 3 tabs for 1 days, 2 tabs for 1 days, then 1 tab by mouth daily for 1 days 04/30/23  Yes Dimond Crotty R, NP  albuterol (VENTOLIN HFA) 108 (90 Base) MCG/ACT inhaler Inhale 2 puffs into the lungs every 6 (six) hours as needed for wheezing or shortness of breath. 04/30/23   Mahreen Schewe, Elita Boone, NP  BIOTIN PO Take 1 tablet by mouth daily.    [provider]  brompheniramine-pseudoephedrine-DM 30-2-10 MG/5ML syrup Take 5 mLs by mouth 4 (four) times daily as needed. Patient not taking: Reported on 11/26/2022 10/30/22  Faythe Ghee, PA-C  meclizine (ANTIVERT) 25 MG tablet Take 25 mg by mouth 3 (three) times daily as needed. 06/17/22   [provider]  methylPREDNISolone (MEDROL DOSEPAK) 4 MG TBPK tablet Take 6 pills on day one then decrease by 1 pill each day Patient not taking: Reported on 11/26/2022 10/30/22   Faythe Ghee, PA-C  metroNIDAZOLE (FLAGYL) 500 MG tablet Take 2 tabs BID for 1 day Patient not taking: Reported on 11/26/2022 09/08/22   Copland, Ilona Sorrel, PA-C  oxyCODONE-acetaminophen (PERCOCET) 5-325 MG tablet Take 1 tablet by mouth every 4 (four) hours as needed for severe pain (pain score 7-10). 02/14/23   Minna Antis, MD  pantoprazole (PROTONIX) 40 MG tablet Take 1 tablet (40 mg total) by mouth daily. 11/25/21   Arnetha Courser, MD  Semaglutide,0.25 or 0.5MG /DOS, (OZEMPIC, 0.25 OR 0.5 MG/DOSE,) 2 MG/1.5ML SOPN Inject  0.5 mg into the skin.    [provider]  sucralfate (CARAFATE) 1 g tablet Take 1 tablet (1 g total) by mouth 4 (four) times daily. Patient not taking: Reported on 11/26/2022 07/22/22 08/21/22  Minna Antis, MD  traZODone (DESYREL) 50 MG tablet Take 0.5 tablets (25 mg total) by mouth at bedtime as needed for sleep. Patient not taking: Reported on 11/26/2022 05/11/22   Oneta Rack, NP  valACYclovir (VALTREX) 500 MG tablet Take 1 tablet (500 mg total) by mouth daily. 10/05/22   Copland, Ilona Sorrel, PA-C    Family History Family History  Problem Relation Age of Onset   Healthy Mother    Other Father        unknown medical history   Breast cancer Maternal Grandmother 7   Skin cancer Maternal Grandfather        40s    Social History Social History   Tobacco Use   Smoking status: Former    Types: Cigarettes   Smokeless tobacco: Never  Vaping Use   Vaping status: Never Used  Substance Use Topics   Alcohol use: Not Currently    Alcohol/week: 6.0 standard drinks of alcohol    Types: 6 Cans of beer per week    Comment: Reports stopping 3 weeks ago 7/26   Drug use: Yes    Types: Marijuana     Allergies   Latex, Penicillins, and Nsaids   Review of Systems Review of Systems   Physical Exam Triage Vital Signs ED Triage Vitals [04/30/23 1453]  Encounter Vitals Group     BP 122/79     Systolic BP Percentile      Diastolic BP Percentile      Pulse Rate 79     Resp (!) 22     Temp 99.2 F (37.3 C)     Temp Source Oral     SpO2 98 %     Weight      Height      Head Circumference      Peak Flow      Pain Score      Pain Loc      Pain Education      Exclude from Growth Chart    No data found.  Updated Vital Signs BP 122/79 (BP Location: Left Arm)   Pulse 79   Temp 99.2 F (37.3 C) (Oral)   Resp (!) 22   LMP 04/23/2023   SpO2 98%   Visual Acuity Right Eye Distance:   Left Eye Distance:   Bilateral Distance:    Right Eye Near:   Left Eye  Near:     Bilateral Near:     Physical Exam Constitutional:      Appearance: Normal appearance.  HENT:     Right Ear: Tympanic membrane, ear canal and external ear normal.     Left Ear: Tympanic membrane, ear canal and external ear normal.     Nose: Congestion present. No rhinorrhea.     Mouth/Throat:     Mouth: Mucous membranes are moist.     Pharynx: Oropharynx is clear. No oropharyngeal exudate or posterior oropharyngeal erythema.  Eyes:     Extraocular Movements: Extraocular movements intact.  Cardiovascular:     Rate and Rhythm: Normal rate and regular rhythm.     Pulses: Normal pulses.     Heart sounds: Normal heart sounds.  Pulmonary:     Effort: Pulmonary effort is normal.     Breath sounds: Normal breath sounds.     Comments: Harsh nonproductive cough witnessed Neurological:     Mental Status: She is alert and oriented to person, place, and time. Mental status is at baseline.      UC Treatments / Results  Labs (all labs ordered are listed, but only abnormal results are displayed) Labs Reviewed - No data to display  EKG   Radiology No results found.  Procedures Procedures (including critical care time)  Medications Ordered in UC Medications  ipratropium-albuterol (DUONEB) 0.5-2.5 (3) MG/3ML nebulizer solution 3 mL (3 mLs Nebulization Given 04/30/23 1512)    Initial Impression / Assessment and Plan / UC Course  I have reviewed the triage vital signs and the nursing notes.  Pertinent labs & imaging results that were available during my care of the patient were reviewed by me and considered in my medical decision making (see chart for details).  COPD exacerbation  Vital signs are stable, O2 saturation 98% on room air lungs are clear to auscultation, harsh cough witnessed, patient requesting nebulizer, DuoNeb given and on reevaluation starting to feel better, declined IM steroid injection and prescribed oral prednisone and azithromycin for home use, recommended  additional supportive care with follow-up as needed Final Clinical Impressions(s) / UC Diagnoses   Final diagnoses:  COPD exacerbation (HCC)     Discharge Instructions      Take azithromycin as directed to provide protection to the airway  Begin prednisone every morning with food to open and relax airway making it easier to breathe  You have been given a breathing treatment here in the clinic to help relax the lungs, may continue use of inhaler at home  You can take Tylenol and/or Ibuprofen as needed for fever reduction and pain relief.   For cough: honey 1/2 to 1 teaspoon (you can dilute the honey in water or another fluid).  You can also use guaifenesin and dextromethorphan for cough. You can use a humidifier for chest congestion and cough.  If you don't have a humidifier, you can sit in the bathroom with the hot shower running.      For sore throat: try warm salt water gargles, cepacol lozenges, throat spray, warm tea or water with lemon/honey, popsicles or ice, or OTC cold relief medicine for throat discomfort.   For congestion: take a daily anti-histamine like Zyrtec, Claritin, and a oral decongestant, such as pseudoephedrine.  You can also use Flonase 1-2 sprays in each nostril daily.   It is important to stay hydrated: drink plenty of fluids (water, gatorade/powerade/pedialyte, juices, or teas) to keep your throat moisturized and help further relieve irritation/discomfort.  If your  breathing worsens at any point please go to the nearest emergency department for evaluation    ED Prescriptions     Medication Sig Dispense Auth. Provider   predniSONE (STERAPRED UNI-PAK 21 TAB) 10 MG (21) TBPK tablet Take by mouth daily. Take 6 tabs by mouth daily  for 1 days, then 5 tabs for 1 days, then 4 tabs for 1 days, then 3 tabs for 1 days, 2 tabs for 1 days, then 1 tab by mouth daily for 1 days 21 tablet Veasna Santibanez R, NP   albuterol (VENTOLIN HFA) 108 (90 Base) MCG/ACT inhaler Inhale  2 puffs into the lungs every 6 (six) hours as needed for wheezing or shortness of breath. 8 g Mally Gavina R, NP   azithromycin (ZITHROMAX) 250 MG tablet Take 1 tablet (250 mg total) by mouth daily. Take first 2 tablets together, then 1 every day until finished. 6 tablet Valinda Hoar, NP      PDMP not reviewed this encounter.   Valinda Hoar, NP 04/30/23 1539

## 2023-05-04 ENCOUNTER — Ambulatory Visit
Admission: EM | Admit: 2023-05-04 | Discharge: 2023-05-04 | Disposition: A | Discharge status: Home / Self Care | Payer: MEDICAID | Attending: Emergency Medicine | Admitting: Emergency Medicine

## 2023-05-04 DIAGNOSIS — J441 Chronic obstructive pulmonary disease with (acute) exacerbation: Secondary | ICD-10-CM

## 2023-05-04 HISTORY — DX: Chronic obstructive pulmonary disease, unspecified: J44.9

## 2023-05-04 MED ORDER — DOXYCYCLINE HYCLATE 100 MG PO CAPS: 100.0000 mg | ORAL_CAPSULE | Freq: Two times a day (BID) | ORAL | 0 refills | Status: AC

## 2023-05-04 NOTE — ED Provider Notes (Signed)
 Rebecca Duffy    CSN: [id number] Arrival date & time: 05/04/23  1720      History   Chief Complaint Chief Complaint  Patient presents with   Nasal Congestion   Wheezing    HPI Rebecca Duffy is a 43 y.o. female.  Patient presents with ongoing ear pain, congestion, cough x 8 days.  No fever or shortness of breath.  She finished Zithromax today and has 1 tablet of prednisone taper left for tomorrow.  Her symptoms have improved somewhat but are persistent.  Patient reports she has history of COPD.  She used her albuterol inhaler just prior to arrival.  Patient was seen at this urgent care on 04/30/2023; diagnosed with COPD exacerbation; DuoNeb given here; treated with albuterol inhaler, Zithromax, and prednisone.  The history is provided by the patient and medical records.    Past Medical History:  Diagnosis Date   ADHD    Bipolar 1 disorder (HCC)    COPD (chronic obstructive pulmonary disease) (HCC)    Gastric ulcer    OCD (obsessive compulsive disorder)    PTSD (post-traumatic stress disorder)    Vertigo     Patient Active Problem List   Diagnosis Date Noted   Trichomoniasis 09/08/2022   Cervical dysplasia 09/03/2022   Polysubstance abuse (HCC) 05/09/2022   Substance induced mood disorder (HCC) 05/09/2022   Cocaine use disorder, moderate, dependence (HCC) 05/09/2022   Passive suicidal ideations 05/09/2022   Chest pain 11/24/2021   Leukocytosis 11/24/2021   NSTEMI (non-ST elevated myocardial infarction) (HCC) 11/24/2021   Tobacco abuse 11/24/2021   Gastric ulcer 11/24/2021   Alcohol use 11/24/2021   Gastroenteritis 11/24/2021   Bipolar I disorder, most recent episode depressed (HCC)    Alcohol use disorder, severe, dependence (HCC)    Cannabis use disorder, severe, dependence (HCC)     Past Surgical History:  Procedure Laterality Date   BREAST ENHANCEMENT SURGERY     CESAREAN SECTION     LEFT HEART CATH AND CORONARY ANGIOGRAPHY N/A 11/24/2021   Procedure:  LEFT HEART CATH AND CORONARY ANGIOGRAPHY;  Surgeon: Wenona Hamilton, MD;  Location: [facility] INVASIVE CV LAB;  Service: Cardiovascular;  Laterality: N/A;   TUBAL LIGATION      OB History     Gravida  10   Para  2   Term  1   Preterm  1   AB  8   Living  2      SAB  2   IAB  6   Ectopic      Multiple      Live Births  2            Home Medications    Prior to Admission medications   Medication Sig Start Date End Date Taking? Authorizing Provider  doxycycline (VIBRAMYCIN) 100 MG capsule Take 1 capsule (100 mg total) by mouth 2 (two) times daily for 7 days. 05/04/23 05/11/23 Yes Wellington Half, NP  albuterol (VENTOLIN HFA) 108 (90 Base) MCG/ACT inhaler Inhale 2 puffs into the lungs every 6 (six) hours as needed for wheezing or shortness of breath. 04/30/23   White, Maybelle Spatz, NP  BIOTIN PO Take 1 tablet by mouth daily.    [provider]  brompheniramine-pseudoephedrine-DM 30-2-10 MG/5ML syrup Take 5 mLs by mouth 4 (four) times daily as needed. Patient not taking: Reported on 11/26/2022 10/30/22   Delsie Figures, PA-C  meclizine (ANTIVERT) 25 MG tablet Take 25 mg by mouth 3 (three) times daily  as needed. 06/17/22   [provider]  methylPREDNISolone (MEDROL DOSEPAK) 4 MG TBPK tablet Take 6 pills on day one then decrease by 1 pill each day Patient not taking: Reported on 11/26/2022 10/30/22   Faythe Ghee, PA-C  metroNIDAZOLE (FLAGYL) 500 MG tablet Take 2 tabs BID for 1 day Patient not taking: Reported on 11/26/2022 09/08/22   Copland, Ilona Sorrel, PA-C  oxyCODONE-acetaminophen (PERCOCET) 5-325 MG tablet Take 1 tablet by mouth every 4 (four) hours as needed for severe pain (pain score 7-10). 02/14/23   Minna Antis, MD  pantoprazole (PROTONIX) 40 MG tablet Take 1 tablet (40 mg total) by mouth daily. 11/25/21   Arnetha Courser, MD  predniSONE (STERAPRED UNI-PAK 21 TAB) 10 MG (21) TBPK tablet Take by mouth daily. Take 6 tabs by mouth daily  for 1 days, then 5  tabs for 1 days, then 4 tabs for 1 days, then 3 tabs for 1 days, 2 tabs for 1 days, then 1 tab by mouth daily for 1 days 04/30/23   Valinda Hoar, NP  Semaglutide,0.25 or 0.5MG /DOS, (OZEMPIC, 0.25 OR 0.5 MG/DOSE,) 2 MG/1.5ML SOPN Inject 0.5 mg into the skin.    [provider]  sucralfate (CARAFATE) 1 g tablet Take 1 tablet (1 g total) by mouth 4 (four) times daily. Patient not taking: Reported on 11/26/2022 07/22/22 08/21/22  Minna Antis, MD  traZODone (DESYREL) 50 MG tablet Take 0.5 tablets (25 mg total) by mouth at bedtime as needed for sleep. Patient not taking: Reported on 11/26/2022 05/11/22   Oneta Rack, NP  valACYclovir (VALTREX) 500 MG tablet Take 1 tablet (500 mg total) by mouth daily. 10/05/22   Copland, Ilona Sorrel, PA-C    Family History Family History  Problem Relation Age of Onset   Healthy Mother    Other Father        unknown medical history   Breast cancer Maternal Grandmother 22   Skin cancer Maternal Grandfather        54s    Social History Social History   Tobacco Use   Smoking status: Former    Types: Cigarettes   Smokeless tobacco: Never  Vaping Use   Vaping status: Never Used  Substance Use Topics   Alcohol use: Not Currently    Alcohol/week: 6.0 standard drinks of alcohol    Types: 6 Cans of beer per week    Comment: Reports stopping 3 weeks ago 7/26   Drug use: Yes    Types: Marijuana     Allergies   Latex, Penicillins, and Nsaids   Review of Systems Review of Systems  Constitutional:  Negative for chills and fever.  HENT:  Positive for congestion and ear pain. Negative for sore throat.   Respiratory:  Positive for cough and wheezing.      Physical Exam Triage Vital Signs ED Triage Vitals  Encounter Vitals Group     BP 05/04/23 1734 106/60     Systolic BP Percentile --      Diastolic BP Percentile --      Pulse Rate 05/04/23 1734 91     Resp 05/04/23 1734 18     Temp 05/04/23 1734 98.1 F (36.7 C)     Temp src --       SpO2 05/04/23 1734 96 %     Weight --      Height --      Head Circumference --      Peak Flow --  Pain Score 05/04/23 1739 4     Pain Loc --      Pain Education --      Exclude from Growth Chart --    No data found.  Updated Vital Signs BP 106/60   Pulse 91   Temp 98.1 F (36.7 C)   Resp 18   LMP 04/23/2023   SpO2 96%   Visual Acuity Right Eye Distance:   Left Eye Distance:   Bilateral Distance:    Right Eye Near:   Left Eye Near:    Bilateral Near:     Physical Exam Constitutional:      General: She is not in acute distress. HENT:     Right Ear: Tympanic membrane normal.     Left Ear: Tympanic membrane normal.     Nose: Congestion present.     Mouth/Throat:     Mouth: Mucous membranes are moist.     Pharynx: Oropharynx is clear.  Cardiovascular:     Rate and Rhythm: Normal rate and regular rhythm.     Heart sounds: Normal heart sounds.  Pulmonary:     Effort: Pulmonary effort is normal. No respiratory distress.     Breath sounds: Normal breath sounds. No wheezing.  Neurological:     Mental Status: She is alert.      UC Treatments / Results  Labs (all labs ordered are listed, but only abnormal results are displayed) Labs Reviewed - No data to display  EKG   Radiology No results found.  Procedures Procedures (including critical care time)  Medications Ordered in UC Medications - No data to display  Initial Impression / Assessment and Plan / UC Course  I have reviewed the triage vital signs and the nursing notes.  Pertinent labs & imaging results that were available during my care of the patient were reviewed by me and considered in my medical decision making (see chart for details).    COPD exacerbation.  Lungs are clear at this time and O2 sat is 96% on room air.  Treating today with doxycycline.  Instructed patient to continue the prednisone as directed.  Instructed her to continue using her albuterol inhaler as directed.  Instructed  her to follow-up with her PCP tomorrow.  Patient does not currently have a PCP but plans to schedule an appointment with [facility] at [facility] tomorrow.  ED precautions given.  Education provided on COPD exacerbation.  She agrees to plan of care.  Final Clinical Impressions(s) / UC Diagnoses   Final diagnoses:  COPD exacerbation (HCC)     Discharge Instructions      Take the doxycycline as directed.  Finish the prednisone as directed.  Continue to use your albuterol inhaler as directed.  Follow up with your primary care provider tomorrow.  Go to the emergency department if you have worsening symptoms.        ED Prescriptions     Medication Sig Dispense Auth. Provider   doxycycline (VIBRAMYCIN) 100 MG capsule Take 1 capsule (100 mg total) by mouth 2 (two) times daily for 7 days. 14 capsule Wellington Half, NP      PDMP not reviewed this encounter.   Wellington Half, NP 05/04/23 [phone]

## 2023-05-04 NOTE — ED Triage Notes (Addendum)
 Patient to Urgent Care with complaints of ear pain and fullness/ chest congestion/ wheezing/ productive cough with grey "speckled" mucus.   Symptoms started 8 days ago. Completed pred taper today/ Zithromax. Reports little relief in symptoms- still wheezing and getting winded easily.   Using albuterol inhaler.

## 2023-05-04 NOTE — Discharge Instructions (Addendum)
 Take the doxycycline as directed.  Finish the prednisone as directed.  Continue to use your albuterol inhaler as directed.  Follow up with your primary care provider tomorrow.  Go to the emergency department if you have worsening symptoms.

## 2023-05-07 ENCOUNTER — Ambulatory Visit
Admission: EM | Admit: 2023-05-07 | Discharge: 2023-05-07 | Disposition: A | Discharge status: Home / Self Care | Payer: MEDICAID

## 2023-05-07 DIAGNOSIS — H9203 Otalgia, bilateral: Secondary | ICD-10-CM | POA: Diagnosis not present

## 2023-05-07 DIAGNOSIS — R051 Acute cough: Secondary | ICD-10-CM | POA: Diagnosis not present

## 2023-05-07 NOTE — ED Provider Notes (Signed)
 [name]    CSN: [id number] Arrival date & time: 05/07/23  1001      History   Chief Complaint Chief Complaint  Patient presents with   Otalgia   Shortness of Breath    HPI Rebecca Duffy is a 43 y.o. female.  Patient presents with ear pain, cough, shortness of breath.  She used peroxide in her ears this morning.  She is currently on doxycycline  and was recently treated with Zithromax  and prednisone .  She last used her albuterol  inhaler 2 days ago.  No OTC medication taken today. No fever.  Patient was seen here on 05/04/2023; diagnosed with COPD exacerbation; treated with doxycycline .  She was seen at this urgent care on 04/30/2023; diagnosed with COPD exacerbation; DuoNeb given here; treated with albuterol  inhaler, Zithromax , and prednisone .    The history is provided by the patient and medical records.    Past Medical History:  Diagnosis Date   ADHD    Bipolar 1 disorder (HCC)    COPD (chronic obstructive pulmonary disease) (HCC)    Gastric ulcer    OCD (obsessive compulsive disorder)    PTSD (post-traumatic stress disorder)    Vertigo     Patient Active Problem List   Diagnosis Date Noted   Trichomoniasis 09/08/2022   Cervical dysplasia 09/03/2022   Polysubstance abuse (HCC) 05/09/2022   Substance induced mood disorder (HCC) 05/09/2022   Cocaine use disorder, moderate, dependence (HCC) 05/09/2022   Passive suicidal ideations 05/09/2022   Chest pain 11/24/2021   Leukocytosis 11/24/2021   NSTEMI (non-ST elevated myocardial infarction) (HCC) 11/24/2021   Tobacco abuse 11/24/2021   Gastric ulcer 11/24/2021   Alcohol use 11/24/2021   Gastroenteritis 11/24/2021   Bipolar I disorder, most recent episode depressed (HCC)    Alcohol use disorder, severe, dependence (HCC)    Cannabis use disorder, severe, dependence (HCC)     Past Surgical History:  Procedure Laterality Date   BREAST ENHANCEMENT SURGERY     CESAREAN SECTION     LEFT HEART CATH AND  CORONARY ANGIOGRAPHY N/A 11/24/2021   Procedure: LEFT HEART CATH AND CORONARY ANGIOGRAPHY;  Surgeon: Darron Deatrice LABOR, MD;  Location: [facility] INV[facility]IVE CV [facility];  Service: Cardiovascular;  Laterality: N/A;   TUBAL LIGATION      OB History     Gravida  10   Para  2   Term  1   Preterm  1   AB  8   Living  2      SAB  2   IAB  6   Ectopic      Multiple      Live Births  2            Home Medications    Prior to Admission medications   Medication Sig Start Date End Date Taking? Authorizing Provider  albuterol  (VENTOLIN  HFA) 108 (90 Base) MCG/ACT inhaler Inhale 2 puffs into the lungs every 6 (six) hours as needed for wheezing or shortness of breath. 04/30/23   White, Shelba SAUNDERS, NP  BIOTIN PO Take 1 tablet by mouth daily.    [provider]  brompheniramine-pseudoephedrine-DM 30-2-10 MG/5ML syrup Take 5 mLs by mouth 4 (four) times daily as needed. Patient not taking: Reported on 11/26/2022 10/30/22   Gasper Devere ORN, PA-C  doxycycline  (VIBRAMYCIN ) 100 MG capsule Take 1 capsule (100 mg total) by mouth 2 (two) times daily for 7 days. 05/04/23 05/11/23  Corlis Burnard DEL, NP  meclizine  (ANTIVERT ) 25 MG tablet Take 25  mg by mouth 3 (three) times daily as needed. 06/17/22   [provider]  methylPREDNISolone  (MEDROL  DOSEPAK) 4 MG TBPK tablet Take 6 pills on day one then decrease by 1 pill each day Patient not taking: Reported on 11/26/2022 10/30/22   Gasper Devere ORN, PA-C  metroNIDAZOLE  (FLAGYL ) 500 MG tablet Take 2 tabs BID for 1 day Patient not taking: Reported on 11/26/2022 09/08/22   Copland, Alicia B, PA-C  oxyCODONE -acetaminophen  (PERCOCET) 5-325 MG tablet Take 1 tablet by mouth every 4 (four) hours as needed for severe pain (pain score 7-10). 02/14/23   Dorothyann Drivers, MD  pantoprazole  (PROTONIX ) 40 MG tablet Take 1 tablet (40 mg total) by mouth daily. 11/25/21   Caleen Qualia, MD  predniSONE  (STERAPRED UNI-PAK 21 TAB) 10 MG (21) TBPK tablet Take by mouth daily.  Take 6 tabs by mouth daily  for 1 days, then 5 tabs for 1 days, then 4 tabs for 1 days, then 3 tabs for 1 days, 2 tabs for 1 days, then 1 tab by mouth daily for 1 days 04/30/23   Teresa Shelba SAUNDERS, NP  Semaglutide,0.25 or 0.5MG /DOS, (OZEMPIC, 0.25 OR 0.5 MG/DOSE,) 2 MG/1.5ML SOPN Inject 0.5 mg into the skin.    [provider]  sucralfate  (CARAFATE ) 1 g tablet Take 1 tablet (1 g total) by mouth 4 (four) times daily. Patient not taking: Reported on 11/26/2022 07/22/22 08/21/22  Dorothyann Drivers, MD  traZODone  (DESYREL ) 50 MG tablet Take 0.5 tablets (25 mg total) by mouth at bedtime as needed for sleep. Patient not taking: Reported on 11/26/2022 05/11/22   Ezzard Staci SAILOR, NP  valACYclovir  (VALTREX ) 500 MG tablet Take 1 tablet (500 mg total) by mouth daily. 10/05/22   Copland, Bernarda NOVAK, PA-C    Family History Family History  Problem Relation Age of Onset   Healthy Mother    Other Father        unknown medical history   Breast cancer Maternal Grandmother 79   Skin cancer Maternal Grandfather        52s    Social History Social History   Tobacco Use   Smoking status: Former    Types: Cigarettes   Smokeless tobacco: Never  Vaping Use   Vaping status: Never Used  Substance Use Topics   Alcohol use: Not Currently    Alcohol/week: 6.0 standard drinks of alcohol    Types: 6 Cans of beer per week    Comment: Reports stopping 3 weeks ago 7/26   Drug use: Yes    Types: Marijuana     Allergies   Latex, Penicillins, and Nsaids   Review of Systems Review of Systems  Constitutional:  Negative for chills and fever.  HENT:  Positive for ear pain. Negative for sore throat.   Respiratory:  Positive for cough and shortness of breath.      Physical Exam Triage Vital Signs ED Triage Vitals [05/07/23 1037]  Encounter Vitals Group     BP 107/63     Systolic BP Percentile      Diastolic BP Percentile      Pulse Rate 83     Resp 18     Temp 98.6 F (37 C)     Temp src      SpO2  97 %     Weight      Height      Head Circumference      Peak Flow      Pain Score  Pain Loc      Pain Education      Exclude from Growth Chart    No data found.  Updated Vital Signs BP 107/63   Pulse 83   Temp 98.6 F (37 C)   Resp 18   LMP 04/23/2023   SpO2 97%   Visual Acuity Right Eye Distance:   Left Eye Distance:   Bilateral Distance:    Right Eye Near:   Left Eye Near:    Bilateral Near:     Physical Exam Constitutional:      General: She is not in acute distress. HENT:     Right Ear: Tympanic membrane and ear canal normal.     Left Ear: Tympanic membrane and ear canal normal.     Nose: Nose normal.     Mouth/Throat:     Mouth: Mucous membranes are moist.     Pharynx: Oropharynx is clear.  Cardiovascular:     Rate and Rhythm: Normal rate and regular rhythm.     Heart sounds: Normal heart sounds.  Pulmonary:     Effort: Pulmonary effort is normal. No respiratory distress.     Breath sounds: Normal breath sounds. No wheezing.  Neurological:     Mental Status: She is alert.      UC Treatments / Results  Labs (all labs ordered are listed, but only abnormal results are displayed) Labs Reviewed - No data to display  EKG   Radiology No results found.  Procedures Procedures (including critical care time)  Medications Ordered in UC Medications - No data to display  Initial Impression / Assessment and Plan / UC Course  I have reviewed the triage vital signs and the nursing notes.  Pertinent labs & imaging results that were available during my care of the patient were reviewed by me and considered in my medical decision making (see chart for details).   Otalgia, cough.  Afebrile, VSS.  Lungs are clear and O2 sat is 97%.  Work note provided per patient request.  Instructed patient to continue taking the doxycycline  as directed.  Instructed her to use her albuterol  inhaler as directed.  Instructed her to establish a primary care provider on  Monday.  ED precautions given.  Patient agrees to plan of care.  Final Clinical Impressions(s) / UC Diagnoses   Final diagnoses:  Otalgia of both ears  Acute cough     Discharge Instructions      Continue the doxycycline  as directed.  Use your albuterol  inhaler as directed.  Establish a primary care provider.       ED Prescriptions   None    PDMP not reviewed this encounter.   Corlis Burnard DEL, NP 05/07/23 1136

## 2023-05-07 NOTE — ED Triage Notes (Signed)
 Patient to Urgent Care with complaints of ear pain and shortness of breath. Symptoms x10 days. Seen at this clinic 4/11 and 4/15. Completed zithromax . Currently taking Doxycycline .   Reports she woke up this morning unable to hear out of her left ear. Used peroxide to get her ear to "pop". Still wheezing.   Requests work note.

## 2023-05-07 NOTE — Discharge Instructions (Addendum)
 Continue the doxycycline  as directed.  Use your albuterol  inhaler as directed.  Establish a primary care provider.

## 2023-05-19 ENCOUNTER — Other Ambulatory Visit: Payer: Self-pay

## 2023-05-19 ENCOUNTER — Emergency Department
Admission: EM | Admit: 2023-05-19 | Discharge: 2023-05-20 | Disposition: A | Discharge status: Home / Self Care | Payer: MEDICAID | Attending: Emergency Medicine | Admitting: Emergency Medicine

## 2023-05-19 ENCOUNTER — Emergency Department: Payer: MEDICAID

## 2023-05-19 ENCOUNTER — Encounter: Payer: Self-pay | Admitting: Emergency Medicine

## 2023-05-19 DIAGNOSIS — R42 Dizziness and giddiness: Secondary | ICD-10-CM | POA: Insufficient documentation

## 2023-05-19 DIAGNOSIS — R519 Headache, unspecified: Secondary | ICD-10-CM | POA: Insufficient documentation

## 2023-05-19 DIAGNOSIS — J449 Chronic obstructive pulmonary disease, unspecified: Secondary | ICD-10-CM | POA: Diagnosis not present

## 2023-05-19 DIAGNOSIS — J3489 Other specified disorders of nose and nasal sinuses: Secondary | ICD-10-CM | POA: Insufficient documentation

## 2023-05-19 LAB — CBC WITH DIFFERENTIAL/PLATELET
Abs Immature Granulocytes: 0.03 10*3/uL (ref 0.00–0.07)
Basophils Absolute: 0 10*3/uL (ref 0.0–0.1)
Basophils Relative: 0 %
Eosinophils Absolute: 0.2 10*3/uL (ref 0.0–0.5)
Eosinophils Relative: 2 %
HCT: 37.9 % (ref 36.0–46.0)
Hemoglobin: 13.4 g/dL (ref 12.0–15.0)
Immature Granulocytes: 0 %
Lymphocytes Relative: 30 %
Lymphs Abs: 2.3 10*3/uL (ref 0.7–4.0)
MCH: 30.7 pg (ref 26.0–34.0)
MCHC: 35.4 g/dL (ref 30.0–36.0)
MCV: 86.7 fL (ref 80.0–100.0)
Monocytes Absolute: 0.5 10*3/uL (ref 0.1–1.0)
Monocytes Relative: 7 %
Neutro Abs: 4.7 10*3/uL (ref 1.7–7.7)
Neutrophils Relative %: 61 %
Platelets: 368 10*3/uL (ref 150–400)
RBC: 4.37 MIL/uL (ref 3.87–5.11)
RDW: 12.2 % (ref 11.5–15.5)
WBC: 7.8 10*3/uL (ref 4.0–10.5)
nRBC: 0 % (ref 0.0–0.2)

## 2023-05-19 LAB — COMPREHENSIVE METABOLIC PANEL WITH GFR
ALT: 14 U/L (ref 0–44)
AST: 14 U/L — ABNORMAL LOW (ref 15–41)
Albumin: 4 g/dL (ref 3.5–5.0)
Alkaline Phosphatase: 49 U/L (ref 38–126)
Anion gap: 10 (ref 5–15)
BUN: 17 mg/dL (ref 6–20)
CO2: 20 mmol/L — ABNORMAL LOW (ref 22–32)
Calcium: 9 mg/dL (ref 8.9–10.3)
Chloride: 105 mmol/L (ref 98–111)
Creatinine, Ser: 0.83 mg/dL (ref 0.44–1.00)
GFR, Estimated: >60 mL/min (ref >60)
Glucose, Bld: 98 mg/dL (ref 70–99)
Potassium: 3.5 mmol/L (ref 3.5–5.1)
Sodium: 135 mmol/L (ref 135–145)
Total Bilirubin: 1.6 mg/dL — ABNORMAL HIGH (ref 0.0–1.2)
Total Protein: 6.9 g/dL (ref 6.5–8.1)

## 2023-05-19 LAB — HCG, QUANTITATIVE, PREGNANCY: hCG, Beta Chain, Quant, S: <1 m[IU]/mL (ref <5)

## 2023-05-19 MED ORDER — MECLIZINE HCL 25 MG PO TABS
25.0000 mg | ORAL_TABLET | Freq: Once | ORAL | Status: AC
  Administered 2023-05-20: 25 mg via ORAL
  Filled 2023-05-19: qty 1

## 2023-05-19 MED ORDER — ACETAMINOPHEN 500 MG PO TABS: 1000.0000 mg | ORAL_TABLET | Freq: Once | ORAL | Status: DC

## 2023-05-19 MED ORDER — SODIUM CHLORIDE 0.9 % IV BOLUS (SEPSIS)
1000.0000 mL | Freq: Once | INTRAVENOUS | Status: AC
  Administered 2023-05-20: 1000 mL via INTRAVENOUS

## 2023-05-19 MED ORDER — PROCHLORPERAZINE EDISYLATE 10 MG/2ML IJ SOLN
10.0000 mg | Freq: Once | INTRAMUSCULAR | Status: AC
  Administered 2023-05-20: 10 mg via INTRAVENOUS
  Filled 2023-05-19: qty 2

## 2023-05-19 NOTE — ED Triage Notes (Addendum)
  Patient comes in with R sided headache and facial pain that started around an hour ago.  Patient states she was watching tv and felt sharp/shooting pain on R temporal area and jaw.  Patient denies any ETOH use, but has smoke marijuana today.  Hx anxiety, PTSD, bipolar disorder, and OCD.  No fevers.  Recently had RSV and ear infections and was on antibiotics.  Pain 5/10.  No neuro symptoms.  NIH - 0.  Also states she hit her head on refrigerator a few days ago but has been asymptomatic.  No LOC.

## 2023-05-19 NOTE — ED Provider Notes (Addendum)
 [facility] Provider Note    Event Date/Time   First MD Initiated Contact with Patient 05/19/23 2259     (approximate)   History   Headache   HPI  Rebecca Duffy is a 43 y.o. female with history of COPD, PTSD, bipolar disorder, OCD, ADHD who presents to the emergency department with complaints of right-sided headache.  She states that she was touching her scalp when she started having pain that radiated into her face.  She feels like she is having swelling and discomfort over her maxillary sinuses.  No head injury.  No fever.  States she has been treated for a viral upper respiratory infection for the past 3 weeks and has been on azithromycin , doxycycline  and steroids.  She tells me that her doctor assumed that it was RSV but patient has declined nasal swabs.  Patient reports feeling tingling in both sides of her face but no other numbness or focal weakness.  No medications taken prior to arrival.  She does report having intermittent vertigo as well.   History provided by patient, daughter.    Past Medical History:  Diagnosis Date   ADHD    Bipolar 1 disorder (HCC)    COPD (chronic obstructive pulmonary disease) (HCC)    Gastric ulcer    OCD (obsessive compulsive disorder)    PTSD (post-traumatic stress disorder)    Vertigo     Past Surgical History:  Procedure Laterality Date   BREAST ENHANCEMENT SURGERY     CESAREAN SECTION     LEFT HEART CATH AND CORONARY ANGIOGRAPHY N/A 11/24/2021   Procedure: LEFT HEART CATH AND CORONARY ANGIOGRAPHY;  Surgeon: Wenona Hamilton, MD;  Location: [facility] INVASIVE CV LAB;  Service: Cardiovascular;  Laterality: N/A;   TUBAL LIGATION      MEDICATIONS:  Prior to Admission medications   Medication Sig Start Date End Date Taking? Authorizing Provider  albuterol  (VENTOLIN  HFA) 108 (90 Base) MCG/ACT inhaler Inhale 2 puffs into the lungs every 6 (six) hours as needed for wheezing or shortness of breath. 04/30/23   White,  Maybelle Spatz, NP  BIOTIN PO Take 1 tablet by mouth daily.    [provider]  brompheniramine-pseudoephedrine-DM 30-2-10 MG/5ML syrup Take 5 mLs by mouth 4 (four) times daily as needed. Patient not taking: Reported on 11/26/2022 10/30/22   Delsie Figures, PA-C  meclizine  (ANTIVERT ) 25 MG tablet Take 25 mg by mouth 3 (three) times daily as needed. 06/17/22   [provider]  methylPREDNISolone  (MEDROL  DOSEPAK) 4 MG TBPK tablet Take 6 pills on day one then decrease by 1 pill each day Patient not taking: Reported on 11/26/2022 10/30/22   Delsie Figures, PA-C  metroNIDAZOLE  (FLAGYL ) 500 MG tablet Take 2 tabs BID for 1 day Patient not taking: Reported on 11/26/2022 09/08/22   Copland, Alicia B, PA-C  oxyCODONE -acetaminophen  (PERCOCET) 5-325 MG tablet Take 1 tablet by mouth every 4 (four) hours as needed for severe pain (pain score 7-10). 02/14/23   Ruth Cove, MD  pantoprazole  (PROTONIX ) 40 MG tablet Take 1 tablet (40 mg total) by mouth daily. 11/25/21   Luna Salinas, MD  predniSONE  (STERAPRED UNI-PAK 21 TAB) 10 MG (21) TBPK tablet Take by mouth daily. Take 6 tabs by mouth daily  for 1 days, then 5 tabs for 1 days, then 4 tabs for 1 days, then 3 tabs for 1 days, 2 tabs for 1 days, then 1 tab by mouth daily for 1 days 04/30/23   Alayne Allis  R, NP  Semaglutide,0.25 or 0.5MG /DOS, (OZEMPIC, 0.25 OR 0.5 MG/DOSE,) 2 MG/1.5ML SOPN Inject 0.5 mg into the skin.    [provider]  sucralfate  (CARAFATE ) 1 g tablet Take 1 tablet (1 g total) by mouth 4 (four) times daily. Patient not taking: Reported on 11/26/2022 07/22/22 08/21/22  Ruth Cove, MD  traZODone  (DESYREL ) 50 MG tablet Take 0.5 tablets (25 mg total) by mouth at bedtime as needed for sleep. Patient not taking: Reported on 11/26/2022 05/11/22   Levester Reagin, NP  valACYclovir  (VALTREX ) 500 MG tablet Take 1 tablet (500 mg total) by mouth daily. 10/05/22   CoplandAmada Jun, PA-C    Physical Exam   Triage Vital  Signs: ED Triage Vitals [05/19/23 1925]  Encounter Vitals Group     BP 131/77     Systolic BP Percentile      Diastolic BP Percentile      Pulse Rate (!) 105     Resp 20     Temp 98.6 F (37 C)     Temp Source Oral     SpO2 99 %     Weight 190 lb (86.2 kg)     Height 5\' 9"  (1.753 m)     Head Circumference      Peak Flow      Pain Score 5     Pain Loc      Pain Education      Exclude from Growth Chart     Most recent vital signs: Vitals:   05/19/23 2310 05/20/23 0058  BP: 113/77 116/67  Pulse: 84 72  Resp: 18   Temp: 98.7 F (37.1 C) 98.7 F (37.1 C)  SpO2: 99% 99%    CONSTITUTIONAL: Alert, responds appropriately to questions. Well-appearing; well-nourished HEAD: Normocephalic, atraumatic, scalp is normal and nontender to palpation EYES: Conjunctivae clear, pupils appear equal, sclera nonicteric ENT: normal nose; moist mucous membranes; TMs are clear bilaterally without erythema, purulence, bulging, perforation, effusion.  No cerumen impaction or sign of foreign body in the external auditory canal. No inflammation, erythema or drainage from the external auditory canal. No signs of mastoiditis. No pain with manipulation of the pinna bilaterally.  Tender over the maxillary sinuses bilaterally without overlying skin changes, redness or warmth. NECK: Supple, normal ROM CARD: RRR; S1 and S2 appreciated RESP: Normal chest excursion without splinting or tachypnea; breath sounds clear and equal bilaterally; no wheezes, no rhonchi, no rales, no hypoxia or respiratory distress, speaking full sentences ABD/GI: Non-distended; soft, non-tender, no rebound, no guarding, no peritoneal signs BACK: The back appears normal EXT: Normal ROM in all joints; no deformity noted, no edema SKIN: Normal color for age and race; warm; no rash on exposed skin NEURO: Moves all extremities equally, normal speech, reports diminished sensation throughout the face diffusely but normal sensation in the  extremities, normal gait, no facial asymmetry  PSYCH: The patient's mood and manner are appropriate.   ED Results / Procedures / Treatments   LABS: (all labs ordered are listed, but only abnormal results are displayed) Labs Reviewed  COMPREHENSIVE METABOLIC PANEL WITH GFR - Abnormal; Notable for the following components:      Result Value   CO2 20 (*)    AST 14 (*)    Total Bilirubin 1.6 (*)    All other components within normal limits  CBC WITH DIFFERENTIAL/PLATELET  HCG, QUANTITATIVE, PREGNANCY     EKG:  EKG Interpretation Date/Time:    Ventricular Rate:    PR Interval:  QRS Duration:    QT Interval:    QTC Calculation:   R Axis:      Text Interpretation:           RADIOLOGY: My personal review and interpretation of imaging: CT head unremarkable.  I have personally reviewed all radiology reports.   CT HEAD WO CONTRAST ( ) Result Date: 05/19/2023 CLINICAL DATA:  Right-sided headache and facial pain for 1 hour EXAM: CT HEAD WITHOUT CONTRAST TECHNIQUE: Contiguous axial images were obtained from the base of the skull through the vertex without intravenous contrast. RADIATION DOSE REDUCTION: This exam was performed according to the departmental dose-optimization program which includes automated exposure control, adjustment of the mA and/or kV according to patient size and/or use of iterative reconstruction technique. COMPARISON:  None Available. FINDINGS: Brain: No acute infarct or hemorrhage. Lateral ventricles and midline structures are unremarkable. No acute extra-axial fluid collections. No mass effect. Vascular: No hyperdense vessel or unexpected calcification. Skull: Normal. Negative for fracture or focal lesion. Sinuses/Orbits: No acute finding. Other: None. IMPRESSION: 1. No acute intracranial process. Electronically Signed   By: Bobbye Burrow M.D.   On: 05/19/2023 23:50     PROCEDURES:  Critical Care performed: No    Procedures    IMPRESSION / MDM /  ASSESSMENT AND PLAN / ED COURSE  I reviewed the triage vital signs and the nursing notes.    Patient here with complaints of bilateral face numbness, right-sided headache, sinus pain.  Being treated for a URI.  The patient is on the cardiac monitor to evaluate for evidence of arrhythmia and/or significant heart rate changes.   DIFFERENTIAL DIAGNOSIS (includes but not limited to):   Viral sinusitis, bacterial sinusitis, allergic rhinitis, complex migraine, low suspicion for intracranial hemorrhage, stroke, CVT, mass   Patient's presentation is most consistent with acute presentation with potential threat to life or bodily function.   PLAN: Labs obtained from triage.  No leukocytosis.  Normal electrolytes.  Pregnancy test negative.  Patient declines nasal swabs for COVID, flu and RSV.  Offered Tylenol  in triage which she declined as well.  Patient requesting head imaging.  Will obtain CT head.  Will give IV fluids, Compazine , meclizine .   MEDICATIONS GIVEN IN ED: Medications  acetaminophen  (TYLENOL ) tablet 1,000 mg (1,000 mg Oral Patient Refused/Not Given 05/19/23 1942)  prochlorperazine  (COMPAZINE ) injection 10 mg (10 mg Intravenous Given 05/20/23 0012)  sodium chloride  0.9 % bolus 1,000 mL (0 mLs Intravenous Stopped 05/20/23 0030)  meclizine  (ANTIVERT ) tablet 25 mg (25 mg Oral Given 05/20/23 0007)  LORazepam  (ATIVAN ) injection 1 mg (1 mg Intravenous Given 05/20/23 0036)     ED COURSE: CT head reviewed and interpreted by myself and the radiologist and is unremarkable.  Patient became restless and had akathisia from Compazine .  I have listed that this in her allergies.  Will give Ativan  and reassess.    1:00 AM  Nurse reports patient is feeling better and headache is now gone.  Discussed with patient that symptoms may be due to sinusitis especially given recent viral URI symptoms and that there is no indication for antibiotics currently.  Patient has already finished a course of azithromycin   and doxycycline .  Will discharge with Flonase , Zyrtec  for symptomatic relief.  Recommend Tylenol , Motrin  as needed.  Will also discharge with meclizine  as needed for vertigo.  Given PCP follow-up information.  Daughter at bedside to drive her home.   At this time, I do not feel there is any life-threatening condition present. I reviewed all  nursing notes, vitals, pertinent previous records.  All lab and urine results, EKGs, imaging ordered have been independently reviewed and interpreted by myself.  I reviewed all available radiology reports from any imaging ordered this visit.  Based on my assessment, I feel the patient is safe to be discharged home without further emergent workup and can continue workup as an outpatient as needed. Discussed all findings, treatment plan as well as usual and customary return precautions.  They verbalize understanding and are comfortable with this plan.  Outpatient follow-up has been provided as needed.  All questions have been answered.    CONSULTS:  none   OUTSIDE RECORDS REVIEWED: Reviewed urgent care notes on April 11th, 15th and 18th.       FINAL CLINICAL IMPRESSION(S) / ED DIAGNOSES   Final diagnoses:  Right-sided headache  Sinus pain  Vertigo     Rx / DC Orders   ED Discharge Orders          Ordered    cetirizine  (ZYRTEC  ALLERGY) 10 MG tablet  Daily        05/20/23 0051    fluticasone  (FLONASE ) 50 MCG/ACT nasal spray  Daily        05/20/23 0051    Ambulatory Referral to Primary Care (Establish Care)        05/20/23 0052             Note:  This document was prepared using Dragon voice recognition software and may include unintentional dictation errors.   Jamilla Galli, Clover Dao, DO 05/20/23 0102    Arney Mayabb, Clover Dao, DO 05/20/23 0102

## 2023-05-20 MED ORDER — MECLIZINE HCL 25 MG PO TABS: 25.0000 mg | ORAL_TABLET | Freq: Three times a day (TID) | ORAL | 0 refills | Status: DC | PRN

## 2023-05-20 MED ORDER — CETIRIZINE HCL 10 MG PO TABS: 10.0000 mg | ORAL_TABLET | Freq: Every day | ORAL | 0 refills | Status: DC

## 2023-05-20 MED ORDER — LORAZEPAM 2 MG/ML IJ SOLN
1.0000 mg | Freq: Once | INTRAMUSCULAR | Status: AC
  Administered 2023-05-20: 1 mg via INTRAVENOUS
  Filled 2023-05-20: qty 1

## 2023-05-20 MED ORDER — FLUTICASONE PROPIONATE 50 MCG/ACT NA SUSP: 1.0000 | Freq: Every day | NASAL | 0 refills | Status: DC

## 2023-05-20 NOTE — ED Notes (Signed)
 Pt reports feeling "jittery" and wants to leave. Ward, DO notified. SEE MAR

## 2023-05-20 NOTE — Discharge Instructions (Addendum)
 You may alternate Tylenol  1000 mg every 6 hours as needed for pain, fever and Ibuprofen  800 mg every 6-8 hours as needed for pain, fever.  Please take Ibuprofen  with food.  Do not take more than 4000 mg of Tylenol  (acetaminophen ) in a 24 hour period.  Your labs, head CT were reassuring today.  Your symptoms may be secondary to a viral sinus infection versus allergies.  We are starting you on an antihistamine and a nasal steroid to take daily.  You do not need antibiotics at this time but I do recommend close follow-up with your primary care doctor if symptoms not improving in 1 to 2 weeks.

## 2023-05-20 NOTE — ED Notes (Signed)
 Pt feeling better and feels like she has returned to her baseline. Pt requesting discharge at this time. Pt's family member at bedside will transport patient home. Ward, DO made aware.

## 2023-06-01 ENCOUNTER — Encounter: Payer: Self-pay | Admitting: Family Medicine

## 2023-06-01 ENCOUNTER — Ambulatory Visit (INDEPENDENT_AMBULATORY_CARE_PROVIDER_SITE_OTHER): Payer: MEDICAID | Admitting: Family Medicine

## 2023-06-01 VITALS — BP 114/75 | HR 95 | Temp 98.4°F | Resp 20 | Ht 69.0 in | Wt 191.0 lb

## 2023-06-01 DIAGNOSIS — F3130 Bipolar disorder, current episode depressed, mild or moderate severity, unspecified: Secondary | ICD-10-CM

## 2023-06-01 DIAGNOSIS — J410 Simple chronic bronchitis: Secondary | ICD-10-CM

## 2023-06-01 DIAGNOSIS — L989 Disorder of the skin and subcutaneous tissue, unspecified: Secondary | ICD-10-CM | POA: Insufficient documentation

## 2023-06-01 DIAGNOSIS — H8110 Benign paroxysmal vertigo, unspecified ear: Secondary | ICD-10-CM | POA: Diagnosis not present

## 2023-06-01 DIAGNOSIS — F319 Bipolar disorder, unspecified: Secondary | ICD-10-CM | POA: Diagnosis not present

## 2023-06-01 NOTE — Progress Notes (Unsigned)
 Established Patient Office Visit  Subjective   Patient ID: Rebecca Duffy, female    DOB: January 03, 1981  Age: 43 y.o. MRN: [id number]  Chief Complaint  Patient presents with   Establish Care    HPI 43 year old woman known to me with COPD, PTSD, bipolar 1, OCD, ADHD, allergic rhinitis, dizziness, GERD. She reports she has been clean for 9 months.  She is off alcohol and drugs (cocaine).  She just quit on her own.  She made a nice man who helped her and she is getting married in 2 weeks.  She is not working a program and she is not seeing psychiatry.  She was going to [facility] but she did not like the medications they put her on.  She has never found medications that were very helpful for her and she believes she has tried all of them.  She is doing meditation on her own and smoking marijuana daily which helps her.  When she lived in [place]  her marijuana was a prescription for her.  She is doing herbal supplements for anxiety and cleansing.  She has done parasite cleanses and she is on a cleansing diet now.  She is interested in psychotherapy. She reports she is going to have HR paperwork for an FMLA leave.  She would like to bring this up here for me to fill out. She has a lesion on her face at her right nasolabial fold that has been getting larger over the last 2 years.  It does not itch or bleed. She is getting semaglutide off the Internet and is taking 0.7 mL of 2 mg/mL weekly.  She has been taking this for about 7 months and has lost a total of 30 pounds.  She had pancreatitis early on in the treatment, seen in ED 02/14/2023 with pancreatitis, but continued treatment.  Has not had pancreatitis again.  She wants to know if I can prescribe the semaglutide.  Advised that her insurance will not cover it but she can get it from [employer].  It is more cost effective for her to get it through the Internet like she is now. She has had to go to the emergency room a couple of times for COPD  exacerbations and asked for a nebulizer.  She has albuterol  metered-dose inhaler but does not feel this is very effective when she is having a COPD exacerbation.  She was seen in the ER 4/11 and 4/15 for COPD exacerbations She has been having dizziness when she rolls over in bed to her left.  Also has dizziness if she bends over to pick up anything from the floor.  Is been going on for several weeks.   ROS    Objective:     BP 114/75 (BP Location: Left Arm, Patient Position: Sitting, Cuff Size: Normal)   Pulse 95   Temp 98.4 F (36.9 C) (Oral)   Resp 20   Ht 5\' 9"  (1.753 m)   Wt 191 lb (86.6 kg)   LMP 05/17/2023 (Approximate)   SpO2 96%   BMI 28.21 kg/m  {Vitals History (Optional):23777}  Physical Exam Vitals and nursing note reviewed.  Constitutional:      Appearance: Normal appearance.  HENT:     Head: Normocephalic and atraumatic.  Eyes:     Conjunctiva/sclera: Conjunctivae normal.  Cardiovascular:     Rate and Rhythm: Normal rate and regular rhythm.  Pulmonary:     Effort: Pulmonary effort is normal.     Breath sounds:  Normal breath sounds.  Musculoskeletal:     Right lower leg: No edema.     Left lower leg: No edema.  Skin:    General: Skin is warm and dry.     Comments: Has a pearly flesh-colored papule near nasolabial fold right face approximately 5 mm.  Neurological:     Mental Status: She is alert and oriented to person, place, and time.  Psychiatric:        Mood and Affect: Mood normal.        Behavior: Behavior normal.        Thought Content: Thought content normal.        Judgment: Judgment normal.      {Perform Simple Foot Exam  Perform Detailed exam:1} {Insert foot Exam (Optional):30965}   No results found for any visits on 06/01/23.  {Labs (Optional):23779}  The ASCVD Risk score (Arnett DK, et al., 2019) failed to calculate for the following reasons:   Risk score cannot be calculated because patient has a medical history suggesting  prior/existing ASCVD    Assessment & Plan:  Skin lesion of face Assessment & Plan: Looks like a Basal cell CA on her face.  Will refer to Dermatology.  Put an image in her chart.    Orders: -     Ambulatory referral to Dermatology  Benign paroxysmal positional vertigo, unspecified laterality Assessment & Plan: Will refer to physical therapy for BPV  Orders: -     Ambulatory referral to Physical Therapy  Bipolar 1 disorder (HCC) -     Ambulatory referral to Psychiatry     No follow-ups on file.    Manisha Cancel K Aryan Sparks, MD

## 2023-06-01 NOTE — Assessment & Plan Note (Signed)
 Looks like a Basal cell CA on her face.  Will refer to Dermatology.  Put an image in her chart.

## 2023-06-01 NOTE — Assessment & Plan Note (Signed)
Will refer to physical therapy for BPV.

## 2023-06-03 ENCOUNTER — Telehealth: Payer: Self-pay

## 2023-06-03 DIAGNOSIS — J449 Chronic obstructive pulmonary disease, unspecified: Secondary | ICD-10-CM | POA: Insufficient documentation

## 2023-06-03 MED ORDER — ALBUTEROL SULFATE (2.5 MG/3ML) 0.083% IN NEBU: 2.5000 mg | INHALATION_SOLUTION | Freq: Four times a day (QID) | RESPIRATORY_TRACT | 1 refills | Status: DC | PRN

## 2023-06-03 NOTE — Telephone Encounter (Signed)
 Order for nebulizer has been initiated through [facility] and submitted to [facility].

## 2023-06-03 NOTE — Assessment & Plan Note (Addendum)
 She has been in the emergency room several times with COPD exacerbations.  She has an albuterol  inhaler only.  She is requesting a nebulizer.  Will get her a nebulizer and albuterol  solution.  Consideration of triple therapy.

## 2023-06-03 NOTE — Assessment & Plan Note (Addendum)
 Untreated bipolar disorder. Has tried many medications unsatisfactory for patient.  Will refer to psychiatry.

## 2023-06-07 ENCOUNTER — Emergency Department
Admission: EM | Admit: 2023-06-07 | Discharge: 2023-06-07 | Disposition: A | Discharge status: Home / Self Care | Payer: MEDICAID | Attending: Emergency Medicine | Admitting: Emergency Medicine

## 2023-06-07 ENCOUNTER — Other Ambulatory Visit: Payer: Self-pay

## 2023-06-07 ENCOUNTER — Emergency Department: Payer: MEDICAID

## 2023-06-07 ENCOUNTER — Encounter: Payer: Self-pay | Admitting: Intensive Care

## 2023-06-07 DIAGNOSIS — R1011 Right upper quadrant pain: Secondary | ICD-10-CM | POA: Diagnosis not present

## 2023-06-07 DIAGNOSIS — R1010 Upper abdominal pain, unspecified: Secondary | ICD-10-CM

## 2023-06-07 DIAGNOSIS — R112 Nausea with vomiting, unspecified: Secondary | ICD-10-CM | POA: Diagnosis not present

## 2023-06-07 DIAGNOSIS — R1013 Epigastric pain: Secondary | ICD-10-CM | POA: Diagnosis not present

## 2023-06-07 LAB — CBC WITH DIFFERENTIAL/PLATELET
Abs Immature Granulocytes: 0.04 10*3/uL (ref 0.00–0.07)
Basophils Absolute: 0 10*3/uL (ref 0.0–0.1)
Basophils Relative: 0 %
Eosinophils Absolute: 0.2 10*3/uL (ref 0.0–0.5)
Eosinophils Relative: 2 %
HCT: 40 % (ref 36.0–46.0)
Hemoglobin: 13.6 g/dL (ref 12.0–15.0)
Immature Granulocytes: 0 %
Lymphocytes Relative: 26 %
Lymphs Abs: 2.3 10*3/uL (ref 0.7–4.0)
MCH: 30.1 pg (ref 26.0–34.0)
MCHC: 34 g/dL (ref 30.0–36.0)
MCV: 88.5 fL (ref 80.0–100.0)
Monocytes Absolute: 0.5 10*3/uL (ref 0.1–1.0)
Monocytes Relative: 6 %
Neutro Abs: 5.8 10*3/uL (ref 1.7–7.7)
Neutrophils Relative %: 66 %
Platelets: 361 10*3/uL (ref 150–400)
RBC: 4.52 MIL/uL (ref 3.87–5.11)
RDW: 12.2 % (ref 11.5–15.5)
WBC: 8.9 10*3/uL (ref 4.0–10.5)
nRBC: 0 % (ref 0.0–0.2)

## 2023-06-07 LAB — URINALYSIS, ROUTINE W REFLEX MICROSCOPIC
Bilirubin Urine: NEGATIVE
Glucose, UA: NEGATIVE mg/dL
Hgb urine dipstick: NEGATIVE
Ketones, ur: NEGATIVE mg/dL
Nitrite: NEGATIVE
Protein, ur: NEGATIVE mg/dL
Specific Gravity, Urine: 1.018 (ref 1.005–1.030)
pH: 7 (ref 5.0–8.0)

## 2023-06-07 LAB — COMPREHENSIVE METABOLIC PANEL WITH GFR
ALT: 15 U/L (ref 0–44)
AST: 14 U/L — ABNORMAL LOW (ref 15–41)
Albumin: 4 g/dL (ref 3.5–5.0)
Alkaline Phosphatase: 49 U/L (ref 38–126)
Anion gap: 10 (ref 5–15)
BUN: 13 mg/dL (ref 6–20)
CO2: 23 mmol/L (ref 22–32)
Calcium: 9.1 mg/dL (ref 8.9–10.3)
Chloride: 103 mmol/L (ref 98–111)
Creatinine, Ser: 0.71 mg/dL (ref 0.44–1.00)
GFR, Estimated: >60 mL/min (ref >60)
Glucose, Bld: 88 mg/dL (ref 70–99)
Potassium: 3.8 mmol/L (ref 3.5–5.1)
Sodium: 136 mmol/L (ref 135–145)
Total Bilirubin: 1.6 mg/dL — ABNORMAL HIGH (ref 0.0–1.2)
Total Protein: 6.9 g/dL (ref 6.5–8.1)

## 2023-06-07 LAB — LIPASE, BLOOD: Lipase: 58 U/L — ABNORMAL HIGH (ref 11–51)

## 2023-06-07 MED ORDER — LACTATED RINGERS IV BOLUS
1000.0000 mL | Freq: Once | INTRAVENOUS | Status: AC
Start: 2023-06-07 — End: 2023-06-07
  Administered 2023-06-07: 1000 mL via INTRAVENOUS

## 2023-06-07 MED ORDER — FENTANYL CITRATE PF 50 MCG/ML IJ SOSY
100.0000 ug | PREFILLED_SYRINGE | Freq: Once | INTRAMUSCULAR | Status: AC
  Administered 2023-06-07: 100 ug via INTRAVENOUS
  Filled 2023-06-07: qty 2

## 2023-06-07 MED ORDER — ONDANSETRON HCL 4 MG PO TABS: 4.0000 mg | ORAL_TABLET | Freq: Three times a day (TID) | ORAL | 0 refills | Status: DC | PRN

## 2023-06-07 MED ORDER — IOHEXOL 300 MG/ML  SOLN
80.0000 mL | Freq: Once | INTRAMUSCULAR | Status: AC | PRN
  Administered 2023-06-07: 80 mL via INTRAVENOUS

## 2023-06-07 MED ORDER — ONDANSETRON HCL 4 MG/2ML IJ SOLN
4.0000 mg | Freq: Once | INTRAMUSCULAR | Status: AC
  Administered 2023-06-07: 4 mg via INTRAVENOUS
  Filled 2023-06-07: qty 2

## 2023-06-07 MED ORDER — MORPHINE SULFATE (PF) 4 MG/ML IV SOLN
4.0000 mg | Freq: Once | INTRAVENOUS | Status: AC
  Administered 2023-06-07: 4 mg via INTRAVENOUS
  Filled 2023-06-07: qty 1

## 2023-06-07 NOTE — ED Provider Triage Note (Signed)
 Emergency Medicine Provider Triage Evaluation Note  Rebecca Duffy , a 43 y.o. female  was evaluated in triage.  Pt complains of abdominal pain and feeling bloat. Nausea and diarrhea.  Pain RUQ and epigastric pain.  Takes semaglutide for weight loss. Gas and reports passing gas smells really bad.    Review of Systems  Positive: Hx of pancreatitis  + weight loss Negative: No fever  Physical Exam  LMP 05/17/2023 (Approximate)  Gen:   Awake, no distress  uncomfortable sitting Resp:  Normal effort  MSK:   Moves extremities without difficulty  Other:  Tenderness of epigastric area and RUQ area.    Medical Decision Making  Medically screening exam initiated at 2:34 PM.  Appropriate orders placed.  Rebecca Duffy was informed that the remainder of the evaluation will be completed by another provider, this initial triage assessment does not replace that evaluation, and the importance of remaining in the ED until their evaluation is complete.     Rebecca Eagles, PA-C 06/07/23 1442

## 2023-06-07 NOTE — ED Triage Notes (Signed)
 Patient reports she works from home and started having severe stomach pains. Tried using warming pad with no relief. Reports bloating. After eating pain was worse.   History pancreatitis and reflux  Currently takes semaglutide

## 2023-06-07 NOTE — ED Notes (Signed)
 See triage notes. Patient c/o  severe abdominal pain and bloating. Patient has hx of pancreatitis and reflux. Patient is currently taking a semaglutide.

## 2023-06-07 NOTE — Discharge Instructions (Signed)
 I have sent nausea medication to your pharmacy to help with your symptoms.  This may be related to some of the medications you are on as imaging was all reassuring today otherwise.  Please follow-up with your primary care provider or return to the emergency department if you have any severe worsening symptoms.

## 2023-06-07 NOTE — ED Provider Notes (Signed)
 [facility] Provider Note    Event Date/Time   First MD Initiated Contact with Patient 06/07/23 1601     (approximate)   History   Abdominal Pain   HPI Rebecca Duffy is a 43 y.o. female presenting today for abdominal pain.  Patient has prior history of pancreatitis for which she was in the hospital for.  She presents today for worsening upper abdominal pain and right-sided abdominal pain.  Concerned about her gallbladder.  Has had nausea with no vomiting.  Also states she takes semaglutide which is caused the symptoms in the past and may be what she is having.  Otherwise denies fever, chills, diarrhea, constipation, dysuria.     Physical Exam   Triage Vital Signs: ED Triage Vitals  Encounter Vitals Group     BP 06/07/23 1435 118/72     Systolic BP Percentile --      Diastolic BP Percentile --      Pulse Rate 06/07/23 1435 93     Resp 06/07/23 1435 18     Temp 06/07/23 1437 99 F (37.2 C)     Temp Source 06/07/23 1437 Oral     SpO2 06/07/23 1435 100 %     Weight 06/07/23 1435 192 lb (87.1 kg)     Height 06/07/23 1435 5' 9.5" (1.765 m)     Head Circumference --      Peak Flow --      Pain Score 06/07/23 1435 7     Pain Loc --      Pain Education --      Exclude from Growth Chart --     Most recent vital signs: Vitals:   06/07/23 1437 06/07/23 1840  BP:  100/72  Pulse:  77  Resp:  18  Temp: 99 F (37.2 C) 98.5 F (36.9 C)  SpO2:  99%   Physical Exam: I have reviewed the vital signs and nursing notes. General: Awake, alert, no acute distress.  Nontoxic appearing. Head:  Atraumatic, normocephalic.   ENT:  EOM intact, PERRL. Oral mucosa is pink and moist with no lesions. Neck: Neck is supple with full range of motion, No meningeal signs. Cardiovascular:  RRR, No murmurs. Peripheral pulses palpable and equal bilaterally. Respiratory:  Symmetrical chest wall expansion.  No rhonchi, rales, or wheezes.  Good air movement throughout.  No use  of accessory muscles.   Musculoskeletal:  No cyanosis or edema. Moving extremities with full ROM Abdomen:  Soft, generalized tenderness palpation throughout the abdomen but most prominent in the right upper quadrant and epigastric regions, nondistended. Neuro:  GCS 15, moving all four extremities, interacting appropriately. Speech clear. Psych:  Calm, appropriate.   Skin:  Warm, dry, no rash.    ED Results / Procedures / Treatments   Labs (all labs ordered are listed, but only abnormal results are displayed) Labs Reviewed  LIPASE, BLOOD - Abnormal; Notable for the following components:      Result Value   Lipase 58 (*)    All other components within normal limits  COMPREHENSIVE METABOLIC PANEL WITH GFR - Abnormal; Notable for the following components:   AST 14 (*)    Total Bilirubin 1.6 (*)    All other components within normal limits  URINALYSIS, ROUTINE W REFLEX MICROSCOPIC - Abnormal; Notable for the following components:   Color, Urine YELLOW (*)    APPearance HAZY (*)    Leukocytes,Ua SMALL (*)    Bacteria, UA RARE (*)    All  other components within normal limits  CBC WITH DIFFERENTIAL/PLATELET     EKG    RADIOLOGY Independently interpreted right upper quadrant ultrasound and CT abdomen/pelvis with no acute pathology   PROCEDURES:  Critical Care performed: No  Procedures   MEDICATIONS ORDERED IN ED: Medications  morphine  (PF) 4 MG/ML injection 4 mg (4 mg Intravenous Given 06/07/23 1625)  ondansetron  (ZOFRAN ) injection 4 mg (4 mg Intravenous Given 06/07/23 1625)  lactated ringers  bolus 1,000 mL (0 mLs Intravenous Stopped 06/07/23 1705)  fentaNYL  (SUBLIMAZE ) injection 100 mcg (100 mcg Intravenous Given 06/07/23 1705)  iohexol  (OMNIPAQUE ) 300 MG/ML solution 80 mL (80 mLs Intravenous Contrast Given 06/07/23 1913)     IMPRESSION / MDM / ASSESSMENT AND PLAN / ED COURSE  I reviewed the triage vital signs and the nursing notes.                               Differential diagnosis includes, but is not limited to, semaglutide side effect, pancreatitis, gastritis, constipation, cholecystitis, cholelithiasis, diverticulitis, appendicitis  Patient's presentation is most consistent with acute complicated illness / injury requiring diagnostic workup.  Patient is a 43 year old female presenting today with most prominently right upper quadrant epigastric pain.  She is tender in these regions most prominently but also present throughout the rest of the abdomen.  Vital signs are otherwise stable.  CBC and CMP mostly reassuring aside from slight elevation in her T. bili.  Her lipase is slightly elevated at 58 although this is decreased from 3 months ago when she had pancreatitis.  UA is negative.  Will start with a right upper quadrant ultrasound first as well as give patient 1 L fluids, Zofran , and morphine .  Right upper quadrant ultrasound shows no acute pathology.  Patient given additional fentanyl  with resolution of pain.  CT abdomen/pelvis ordered which shows no acute pathology.  She has a small 3.2 cm cyst on her right ovary but does not have any pain at this site so low concern for ovarian pathology as a source of her pain.  Most of her pain is in her upper abdomen which may be related to her semaglutide use.  Otherwise safe for discharge and sent home with Zofran  as needed.  She will call her PCP for follow-up.  Given strict return precautions.  Clinical Course as of 06/07/23 2022  Mon Jun 07, 2023  2008 No acute pathology seen on CT [DW]    Clinical Course User Index [DW] Kandee Orion, MD     FINAL CLINICAL IMPRESSION(S) / ED DIAGNOSES   Final diagnoses:  Upper abdominal pain  Nausea and vomiting, unspecified vomiting type     Rx / DC Orders   ED Discharge Orders          Ordered    ondansetron  (ZOFRAN ) 4 MG tablet  Every 8 hours PRN        06/07/23 2022             Note:  This document was prepared using Dragon voice recognition  software and may include unintentional dictation errors.   Kandee Orion, MD 06/07/23 2024

## 2023-06-26 ENCOUNTER — Ambulatory Visit: Payer: MEDICAID | Admitting: Physical Therapy

## 2023-07-05 ENCOUNTER — Encounter: Payer: Self-pay | Admitting: Family Medicine

## 2023-07-05 ENCOUNTER — Ambulatory Visit: Payer: MEDICAID | Attending: Family Medicine

## 2023-07-05 ENCOUNTER — Ambulatory Visit (INDEPENDENT_AMBULATORY_CARE_PROVIDER_SITE_OTHER): Payer: MEDICAID | Admitting: Family Medicine

## 2023-07-05 ENCOUNTER — Telehealth: Payer: Self-pay

## 2023-07-05 VITALS — BP 105/57 | HR 95 | Temp 99.2°F | Resp 20 | Ht 69.5 in | Wt 186.0 lb

## 2023-07-05 DIAGNOSIS — J410 Simple chronic bronchitis: Secondary | ICD-10-CM

## 2023-07-05 DIAGNOSIS — N83201 Unspecified ovarian cyst, right side: Secondary | ICD-10-CM

## 2023-07-05 DIAGNOSIS — J42 Unspecified chronic bronchitis: Secondary | ICD-10-CM | POA: Diagnosis not present

## 2023-07-05 DIAGNOSIS — R42 Dizziness and giddiness: Secondary | ICD-10-CM | POA: Insufficient documentation

## 2023-07-05 DIAGNOSIS — M545 Low back pain, unspecified: Secondary | ICD-10-CM

## 2023-07-05 DIAGNOSIS — T782XXD Anaphylactic shock, unspecified, subsequent encounter: Secondary | ICD-10-CM | POA: Diagnosis not present

## 2023-07-05 MED ORDER — METHOCARBAMOL 500 MG PO TABS: 1000.0000 mg | ORAL_TABLET | Freq: Three times a day (TID) | ORAL | 1 refills | Status: DC

## 2023-07-05 MED ORDER — ALBUTEROL SULFATE HFA 108 (90 BASE) MCG/ACT IN AERS: 2.0000 | INHALATION_SPRAY | Freq: Four times a day (QID) | RESPIRATORY_TRACT | 2 refills | Status: AC | PRN

## 2023-07-05 MED ORDER — ALBUTEROL SULFATE (2.5 MG/3ML) 0.083% IN NEBU: 2.5000 mg | INHALATION_SOLUTION | Freq: Four times a day (QID) | RESPIRATORY_TRACT | 1 refills | Status: AC | PRN

## 2023-07-05 MED ORDER — EPINEPHRINE 0.3 MG/0.3ML IJ SOAJ: 0.3000 mg | INTRAMUSCULAR | 12 refills | Status: AC | PRN

## 2023-07-05 NOTE — Progress Notes (Signed)
 Established Patient Office Visit  Subjective   Patient ID: Rebecca Duffy, female    DOB: 1980/02/09  Age: 43 y.o. MRN: [id number]  Chief Complaint  Patient presents with   Medical Management of Chronic Issues   Asthma   Referral    chiropractor   Back Pain    HPI Discussed the use of AI scribe software for clinical note transcription with the patient, who gave verbal consent to proceed.  History of Present Illness   A 43 year old female with pancreatitis and bipolar disorder presents with back pain, brittle fingernails, and night terrors.  She experiences excruciating back pain localized to the lower right side, particularly after activities such as grocery shopping. The pain is described as stiff, and she has been unable to 'pop' her back for a year or two. No radiographic imaging has been performed. She suspects prolonged sitting at a computer desk may contribute to her symptoms. There is a family history of arthritis in her grandmother and mother.  She has a history of pancreatitis and recently managed an episode with a three-day liquid diet of bone broth and water, which helped reduce her pancreatic enzyme levels. She is currently on a low carbohydrate diet and avoids greasy foods.  She reports brittle fingernails that break and bleed easily and inquires about the use of biotin and vitamin D for improvement.  A right ovarian cyst was discovered during a CT scan for pelvic pain, causing aching pain initially mistaken for period cramps.  She had one a year ago and was asked to follow with GYN but she did not.  She is willing to follow with GYN now    She has an appointment at [facility]  later today.   She experiences night terrors and nightmares, increasing in frequency to two or three times a week. She previously used Prazosin  effectively for these symptoms. She would also like to restart Caplyta for her bipolar disorder.  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  She smokes marijuana regularly, about a gram or two a day, using a bong to avoid tobacco exposure. She does not consume alcohol and recently quit drinking entirely, including on her wedding day two weeks ago.  She experiences vertigo and is scheduled for physical therapy. Her mother had similar symptoms and found relief through physical therapy.  She feels tired and has a low-grade fever, which she attributes to her menstrual cycle. No cigarette smoking and clear lung sounds when away from home.          ROS    Objective:     BP (!) 105/57 (BP Location: Left Arm, Patient Position: Sitting, Cuff Size: Normal)   Pulse 95   Temp 99.2 F (37.3 C) (Oral)   Resp 20   Ht 5' 9.5 (1.765 m)   Wt 186 lb (84.4 kg)   SpO2 94%   BMI 27.07 kg/m    Physical Exam Vitals and nursing note reviewed.  Constitutional:      Appearance: Normal appearance.  HENT:     Head: Normocephalic and atraumatic.   Eyes:     Conjunctiva/sclera: Conjunctivae normal.    Cardiovascular:     Rate and Rhythm: Normal rate and regular rhythm.  Pulmonary:     Effort: Pulmonary effort is normal.     Breath sounds: Normal breath sounds.   Musculoskeletal:  Right lower leg: No edema.     Left lower leg: No edema.   Skin:    General: Skin is warm and dry.   Neurological:     Mental Status: She is alert and oriented to person, place, and time.   Psychiatric:        Mood and Affect: Mood normal.        Behavior: Behavior normal.        Thought Content: Thought content normal.        Judgment: Judgment normal.          No results found for any visits on 07/05/23.    The ASCVD Risk score (Arnett DK, et al., 2019) failed to calculate for the following reasons:   Risk score cannot be calculated because patient has a medical history suggesting prior/existing ASCVD    Assessment  & Plan:  Chronic bronchitis, unspecified chronic bronchitis type (HCC) -     Albuterol  Sulfate; Take 3 mLs (2.5 mg total) by nebulization every 6 (six) hours as needed for wheezing or shortness of breath.  Dispense: 150 mL; Refill: 1 -     Albuterol  Sulfate HFA; Inhale 2 puffs into the lungs every 6 (six) hours as needed for wheezing or shortness of breath.  Dispense: 8 g; Refill: 2  Simple chronic bronchitis (HCC) -     Albuterol  Sulfate; Take 3 mLs (2.5 mg total) by nebulization every 6 (six) hours as needed for wheezing or shortness of breath.  Dispense: 150 mL; Refill: 1  Anaphylaxis, subsequent encounter -     EPINEPHrine; Inject 0.3 mg into the muscle as needed for anaphylaxis.  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  She doesn not want to stop the Semaglutide.  Improved enzyme levels after dietary changes. Advised on dietary management to prevent recurrence. - Continue low-fat, low-carb diet. - Avoid greasy foods. - Monitor pancreatic enzyme levels.  Chronic back pain Severe lower back pain with muscle tenderness. Considering imaging and medication for relief. - Order x-rays of the lower back. - Prescribe muscle relaxers for symptomatic relief.  Bipolar disorder and PTSD Plans to see [facility] later today and would like to restart medications for bipolar disorder and PTSD. 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 Explained treatment rationale. - Continue physical therapy for vertigo.  Ovarian cyst Cyst with aching pain.  Referral to GYN - Monitor symptoms. - Consider follow-up imaging if symptoms persist.  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 Discussed stress  management to reduce frequency. - Continue Valtrex  during outbreaks. - Monitor for triggers and manage stress.  Brittle nails Brittle nails with potential benefit from supplementation. - Start biotin supplementation. - Start vitamin D3 supplementation, 1000-2000 IU daily.        Return in about 2 months (around 09/04/2023).    Everett Ehrler K Alaric Gladwin, MD

## 2023-07-05 NOTE — Telephone Encounter (Signed)
 Copied from CRM [id number]. Topic: General - Other >> Jul 05, 2023  4:13 PM Elle L wrote: Reason for CRM: The patient wanted to make Dr. Ziglar aware that she went to the radiology center and there was not an x-ray technician available to do her x-ray. She is going to try to get it done this week when there is an Dentist available.

## 2023-07-06 ENCOUNTER — Ambulatory Visit
Admission: RE | Admit: 2023-07-06 | Discharge: 2023-07-06 | Disposition: A | Discharge status: Home / Self Care | Payer: MEDICAID | Source: Ambulatory Visit | Attending: Family Medicine | Admitting: Family Medicine

## 2023-07-06 ENCOUNTER — Ambulatory Visit: Payer: Self-pay | Admitting: Family Medicine

## 2023-07-06 DIAGNOSIS — M545 Low back pain, unspecified: Secondary | ICD-10-CM

## 2023-07-06 NOTE — Therapy (Signed)
 OUTPATIENT PHYSICAL THERAPY VESTIBULAR EVALUATION     Patient Name: Rebecca Duffy MRN: [id number] DOB:Jan 19, 1981, 43 y.o., female Today's Date: 07/07/2023  END OF SESSION:  PT End of Session - 07/07/23 0841     Visit Number 1    Number of Visits 25    Date for PT Re-Evaluation 09/29/23    PT Start Time 0801    PT Stop Time 0841    PT Time Calculation (min) 40 min    Activity Tolerance Patient tolerated treatment well    Behavior During Therapy WFL for tasks assessed/performed          Past Medical History:  Diagnosis Date   ADHD    Allergy    Bipolar 1 disorder (HCC)    COPD (chronic obstructive pulmonary disease) (HCC)    Gastric ulcer    GERD (gastroesophageal reflux disease)    OCD (obsessive compulsive disorder)    Pancreatitis    PTSD (post-traumatic stress disorder)    Vertigo    Past Surgical History:  Procedure Laterality Date   BREAST ENHANCEMENT SURGERY     CESAREAN SECTION     COSMETIC SURGERY     LEFT HEART CATH AND CORONARY ANGIOGRAPHY N/A 11/24/2021   Procedure: LEFT HEART CATH AND CORONARY ANGIOGRAPHY;  Surgeon: Wenona Hamilton, MD;  Location: [facility];  Service: Cardiovascular;  Laterality: N/A;   TUBAL LIGATION     Patient Active Problem List   Diagnosis Date Noted   COPD (chronic obstructive pulmonary disease) (HCC) 06/03/2023   Skin lesion of face 06/01/2023   Benign paroxysmal positional vertigo 06/01/2023   Trichomoniasis 09/08/2022   Cervical dysplasia 09/03/2022   Polysubstance abuse (HCC) 05/09/2022   Substance induced mood disorder (HCC) 05/09/2022   Cocaine use disorder, moderate, dependence (HCC) 05/09/2022   Passive suicidal ideations 05/09/2022   Leukocytosis 11/24/2021   NSTEMI (non-ST elevated myocardial infarction) (HCC) 11/24/2021   Tobacco abuse 11/24/2021   Gastric ulcer 11/24/2021   Gastroenteritis 11/24/2021   Bipolar I disorder, most recent episode depressed (HCC)    Alcohol use disorder, severe,  dependence (HCC)    Cannabis use disorder, severe, dependence (HCC)     PCP: Ziglar, Susan K, MD REFERRING PROVIDER: Ziglar, Susan K, MD  REFERRING DIAG:  Diagnosis  H81.10 (ICD-10-CM) - Benign paroxysmal positional vertigo, unspecified laterality    THERAPY DIAG:  Dizziness and giddiness  ONSET DATE: 8-9 years  Rationale for Evaluation and Treatment: Rehabilitation  SUBJECTIVE:   SUBJECTIVE STATEMENT: The pt is a pleasant 43 y/o female presenting to PT eval for dizziness. Pt reports hx of inner ear infection 8-9 years ago with vertigo. She reports vertigo has recently been really bad, and she wonders if home has mold issues making things worse. Reports meclizine  is not working anymore for her symptoms. She says bending, tilting her had back head back  and rolling causes symptoms (specifically to R). Moving quickly can also cause symptoms, as does sitting up from bed. Vertigo used to come and go, but now has been constant for a year. She reports if she corrects her position it will go away in a few minutes. Pt reports tinnitus in both ears. Can have pulsing/popping sound in ears. She is unsure about hearing loss. Has not had ears checked.  Pt reports hx of major concussion to front part of my brain where she needed hospitalization (also about 8 years ago) and had to learn how to walk again. She reports a few other concussions from an  accident on a horse (43 y/o), and another from wrestling with her sister.  Pt accompanied by: self  PERTINENT HISTORY:   PMH per chart includes ADHD, bipolar 1, COPD, gastric ulcer, OCD, pancreatitis, PTSD, vertigo, hx of pancreatitis, pt also reports hx of multiple concussions (one requiring hospitalization and needing to relearn to walk), please refer to chart for full details  PAIN:  Are you having pain? Pt reports being seen for lower back pain. Can get severe stiffness and pain with gait and mm spasms. Reports new medication is helping.    PRECAUTIONS: none & Fall  RED FLAGS:   WEIGHT BEARING RESTRICTIONS: No  FALLS: Has patient fallen in last 6 months? No but 4-5 near falls.   LIVING ENVIRONMENT: Lives with: husband and 3 kids, 10 cats, 2 dogs   PLOF: Independent  PATIENT GOALS:   OBJECTIVE:  Note: Objective measures were completed at Evaluation unless otherwise noted.  DIAGNOSTIC FINDINGS:   CT HEAD 05/19/23:  FINDINGS: Brain: No acute infarct or hemorrhage. Lateral ventricles and midline structures are unremarkable. No acute extra-axial fluid collections. No mass effect.   Vascular: No hyperdense vessel or unexpected calcification.   Skull: Normal. Negative for fracture or focal lesion.   Sinuses/Orbits: No acute finding.   Other: None.   IMPRESSION: 1. No acute intracranial process.     Electronically Signed   By: Bobbye Burrow M.D.   On: 05/19/2023 23:50  COGNITION: Overall cognitive status: Within functional limits for tasks assessed   POSTURE:  Formal assessment deferred, but upon observation pt with no significant postural limitations noted at rest, seated  Cervical ROM:    Screen indicates pt with sufficient, pain-free cervical ext/flex, bilat rotation needed for vestibular testing    BED MOBILITY:  Impaired per pt hx and DHI questionnaire   TRANSFERS: Assistive device utilized: None  Sit to stand: Complete Independence and Modified independence* Stand to sit: Complete Independence and Modified independence* *pt observed taking more time for STS with symptoms, but with sufficient strength for STS    GAIT: Gait pattern: per pt hx, DHI gait impaired when symptomatic, scanning likely impaired due to symptoms with quick head turns and formal assessment to be completed future date   FUNCTIONAL TESTS:  Dynamic Gait Index: deferred  PATIENT SURVEYS:  DHI 44  VESTIBULAR ASSESSMENT:   OCULOMOTOR EXAM: deferred to future visit/to be completed as indicate  Ocular  ROM:   Spontaneous Nystagmus:   Gaze-Induced Nystagmus:   Smooth Pursuits:   Saccades:   Convergence/Divergence:    VESTIBULAR - OCULAR REFLEX:  deferred to future visit/to be completed as indicate  Slow VOR:   VOR Cancellation:   Head-Impulse Test:   Dynamic Visual Acuity:    POSITIONAL TESTING:  Room light- Dix-Hallpike:  Rigjt -No nystagmus, pt reports instant onset vertigo that lasted >2 min but did decrease in intensity, rates intensity moderate.  Left- negative, no nystagmus or vertigo reported  Right Dix-Hallpike repeated -observed subtle, upbeat R torsional nystagmus with delayed onset, <30 sec duration, with veritgo   Roll test: negative bilat  TREATMENT DATE: 07/07/23   CRM: Epely to treat R side 2x - no dizziness with second epley maneuver  Reviewed findings of assessment & provided post-maneuver pt education printout on BPPV, and educated pt on post-maneuver precautions. Pt verbalized understanding.   PATIENT EDUCATION: Education details: Assessment findings, purpose of positional testing & maneuvers, post-maneuver precautions, plan Person educated: Patient Education method: Explanation and Handouts Education comprehension: verbalized understanding  HOME EXERCISE PROGRAM:  GOALS: Goals reviewed with patient? Yes   SHORT TERM GOALS: Target date: 08/18/2023   Patient will be independent in home exercise program to improve dizziness and balance/mobility for better functional independence with ADLs. Baseline: Goal status: INITIAL   LONG TERM GOALS: Target date: 09/29/2023    1.  Patient will reduce dizziness handicap inventory score to <30, for less dizziness with ADLs and increased safety with home and work tasks.  Baseline: 44 Goal status: INITIAL  2.  Patient will demonstrate negative right Dix-Hallpike and report no dizziness with  bed mobility to indicate resolution of symptoms Baseline: test currently positive, causes vertigo  Goal status: INITIAL  3.  Patient will increase dynamic gait index score to >19/24 as to demonstrate reduced fall risk and improved dynamic gait balance for better safety with community/home ambulation.   Baseline:  Goal status: INITIAL   ASSESSMENT:  CLINICAL IMPRESSION: Patient is a pleasant 43 y.o. female who was seen today for physical therapy evaluation and treatment for dizziness. Pt found to have positive R Dix-Hallpike today, exhibiting  subtle, upbeat R torsional nystagmus with delayed onset, <30 sec duration, and with vertigo. PT provided CRM 2x to R side to treat symptoms suggestive of right posterior canal canalithiasis. Plan to reassess [facility] next visit. The pt will benefit from further skilled PT to improve dizziness and imbalance/gait-related dizziness impairment in order to increase ease and safety with ADLS and return pt to PLOF.  OBJECTIVE IMPAIRMENTS: decreased activity tolerance, decreased balance, difficulty walking, dizziness, and pain.   ACTIVITY LIMITATIONS: bending, stairs, bed mobility, locomotion level, and general ADLs/mobility can be affected when pt having dizzy symptoms/dizzy episode  PARTICIPATION LIMITATIONS: meal prep, cleaning, driving, shopping, community activity, and yard workgeneral ADLs/mobility can be affected when pt having dizzy symptoms/dizzy episode  PERSONAL FACTORS: Sex, Time since onset of injury/illness/exacerbation, and 3+ comorbidities: PMH - ADHD, bipolar 1, COPD, gastric ulcer, OCD, pancreatitis, PTSD, vertigo, hx of pancreatitis, pt also reports hx of multiple concussions (one requiring hospitalization and needing to relearn to walk) are also affecting patient's functional outcome.   REHAB POTENTIAL: Good  CLINICAL DECISION MAKING: Evolving/moderate complexity  EVALUATION COMPLEXITY: Moderate   PLAN:  PT FREQUENCY: 1-2x/week  PT  DURATION: 12 weeks  PLANNED INTERVENTIONS: 97164- PT Re-evaluation, 97750- Physical Performance Testing, 97110-Therapeutic exercises, 97530- Therapeutic activity, 97112- Neuromuscular re-education, 97535- Self Care, 09811- Manual therapy, [phone]- Gait training, [phone]- Orthotic Initial, [phone]- Canalith repositioning, Patient/Family education, Balance training, Stair training, Taping, Joint mobilization, Spinal mobilization, Vestibular training, DME instructions, Cryotherapy, and Moist heat  PLAN FOR NEXT SESSION: repeat positional testing and other vestibular assessments as indicated/initiate HEP as indicated   Samie Crews, PT 07/07/2023, 12:59 PM

## 2023-07-07 ENCOUNTER — Ambulatory Visit: Payer: MEDICAID

## 2023-07-07 DIAGNOSIS — R42 Dizziness and giddiness: Secondary | ICD-10-CM | POA: Diagnosis present

## 2023-07-08 ENCOUNTER — Ambulatory Visit: Payer: MEDICAID

## 2023-07-14 ENCOUNTER — Ambulatory Visit: Payer: MEDICAID

## 2023-07-14 DIAGNOSIS — R42 Dizziness and giddiness: Secondary | ICD-10-CM

## 2023-07-14 NOTE — Therapy (Signed)
 OUTPATIENT PHYSICAL THERAPY VESTIBULAR TREATMENT     Patient Name: Rebecca Duffy MRN: [id number] DOB:11/08/1980, 43 y.o., female Today's Date: 07/14/2023  END OF SESSION:  PT End of Session - 07/14/23 0757     Visit Number 2    Number of Visits 25    Date for PT Re-Evaluation 09/29/23    PT Start Time 0802    PT Stop Time 0841    PT Time Calculation (min) 39 min    Activity Tolerance Patient tolerated treatment well    Behavior During Therapy WFL for tasks assessed/performed          Past Medical History:  Diagnosis Date   ADHD    Allergy    Bipolar 1 disorder (HCC)    COPD (chronic obstructive pulmonary disease) (HCC)    Gastric ulcer    GERD (gastroesophageal reflux disease)    OCD (obsessive compulsive disorder)    Pancreatitis    PTSD (post-traumatic stress disorder)    Vertigo    Past Surgical History:  Procedure Laterality Date   BREAST ENHANCEMENT SURGERY     CESAREAN SECTION     COSMETIC SURGERY     LEFT HEART CATH AND CORONARY ANGIOGRAPHY N/A 11/24/2021   Procedure: LEFT HEART CATH AND CORONARY ANGIOGRAPHY;  Surgeon: Darron Deatrice LABOR, MD;  Location: [facility];  Service: Cardiovascular;  Laterality: N/A;   TUBAL LIGATION     Patient Active Problem List   Diagnosis Date Noted   COPD (chronic obstructive pulmonary disease) (HCC) 06/03/2023   Skin lesion of face 06/01/2023   Benign paroxysmal positional vertigo 06/01/2023   Trichomoniasis 09/08/2022   Cervical dysplasia 09/03/2022   Polysubstance abuse (HCC) 05/09/2022   Substance induced mood disorder (HCC) 05/09/2022   Cocaine use disorder, moderate, dependence (HCC) 05/09/2022   Passive suicidal ideations 05/09/2022   Leukocytosis 11/24/2021   NSTEMI (non-ST elevated myocardial infarction) (HCC) 11/24/2021   Tobacco abuse 11/24/2021   Gastric ulcer 11/24/2021   Gastroenteritis 11/24/2021   Bipolar I disorder, most recent episode depressed (HCC)    Alcohol use disorder, severe,  dependence (HCC)    Cannabis use disorder, severe, dependence (HCC)     PCP: Ziglar, Susan K, MD REFERRING PROVIDER: Ziglar, Susan K, MD  REFERRING DIAG:  Diagnosis  H81.10 (ICD-10-CM) - Benign paroxysmal positional vertigo, unspecified laterality    THERAPY DIAG:  Dizziness and giddiness  ONSET DATE: 8-9 years  Rationale for Evaluation and Treatment: Rehabilitation  SUBJECTIVE:   SUBJECTIVE STATEMENT: Pt reports symptoms are different: she is getting dizzy down and to the left and she is experiencing head rushes. She thinks it may be due to a medication she started (pt having severe back pain). Pt describes symptoms as lightheaded, a little spinny and nauseated. Pt reports when she goes from supine>standing she feels lightheaded and can't see (describes as brown stuff) and takes a few seconds for vision to come back.    Pt accompanied by: self  PERTINENT HISTORY:     The pt is a pleasant 43 y/o female presenting to PT eval for dizziness. Pt reports hx of inner ear infection 8-9 years ago with vertigo. She reports vertigo has recently been really bad, and she wonders if home has mold issues making things worse. Reports meclizine  is not working anymore for her symptoms. She says bending, tilting her had back head back  and rolling causes symptoms (specifically to R). Moving quickly can also cause symptoms, as does sitting up from bed. Vertigo used to  come and go, but now has been constant for a year. She reports if she corrects her position it will go away in a few minutes. Pt reports tinnitus in both ears. Can have pulsing/popping sound in ears. She is unsure about hearing loss. Has not had ears checked.  Pt reports hx of major concussion to front part of my brain where she needed hospitalization (also about 8 years ago) and had to learn how to walk again. She reports a few other concussions from an accident on a horse (43 y/o), and another from wrestling with her sister.  PMH  per chart includes ADHD, bipolar 1, COPD, gastric ulcer, OCD, pancreatitis, PTSD, vertigo, hx of pancreatitis, pt also reports hx of multiple concussions (one requiring hospitalization and needing to relearn to walk), please refer to chart for full details  PAIN:  Are you having pain? Pt reports being seen for lower back pain. Can get severe stiffness and pain with gait and mm spasms. Reports new medication is helping.   PRECAUTIONS: none & Fall  RED FLAGS:   WEIGHT BEARING RESTRICTIONS: No  FALLS: Has patient fallen in last 6 months? No but 4-5 near falls.   LIVING ENVIRONMENT: Lives with: husband and 3 kids, 10 cats, 2 dogs   PLOF: Independent  PATIENT GOALS:   OBJECTIVE:  Note: Objective measures were completed at Evaluation unless otherwise noted.  DIAGNOSTIC FINDINGS:   CT HEAD 05/19/23:  FINDINGS: Brain: No acute infarct or hemorrhage. Lateral ventricles and midline structures are unremarkable. No acute extra-axial fluid collections. No mass effect.   Vascular: No hyperdense vessel or unexpected calcification.   Skull: Normal. Negative for fracture or focal lesion.   Sinuses/Orbits: No acute finding.   Other: None.   IMPRESSION: 1. No acute intracranial process.     Electronically Signed   By: Ozell Daring M.D.   On: 05/19/2023 23:50  COGNITION: Overall cognitive status: Within functional limits for tasks assessed   POSTURE:  Formal assessment deferred, but upon observation pt with no significant postural limitations noted at rest, seated  Cervical ROM:    Screen indicates pt with sufficient, pain-free cervical ext/flex, bilat rotation needed for vestibular testing    BED MOBILITY:  Impaired per pt hx and DHI questionnaire   TRANSFERS: Assistive device utilized: None  Sit to stand: Complete Independence and Modified independence* Stand to sit: Complete Independence and Modified independence* *pt observed taking more time for STS with  symptoms, but with sufficient strength for STS    GAIT: Gait pattern: per pt hx, DHI gait impaired when symptomatic, scanning likely impaired due to symptoms with quick head turns and formal assessment to be completed future date   FUNCTIONAL TESTS:  Dynamic Gait Index: deferred  PATIENT SURVEYS:  DHI 44  VESTIBULAR ASSESSMENT:   OCULOMOTOR EXAM: deferred to future visit/to be completed as indicate  Ocular ROM:   Spontaneous Nystagmus:   Gaze-Induced Nystagmus:   Smooth Pursuits:   Saccades:   Convergence/Divergence:    VESTIBULAR - OCULAR REFLEX:  deferred to future visit/to be completed as indicate  Slow VOR:   VOR Cancellation:   Head-Impulse Test:   Dynamic Visual Acuity:    POSITIONAL TESTING:  Room light- Dix-Hallpike:  Rigjt -No nystagmus, pt reports instant onset vertigo that lasted >2 min but did decrease in intensity, rates intensity moderate.  Left- negative, no nystagmus or vertigo reported  Right Dix-Hallpike repeated -observed subtle, upbeat R torsional nystagmus with delayed onset, <30 sec duration, with veritgo  Roll test: negative bilat                                                                                                                            TREATMENT DATE: 07/14/23 NMR:  DH: negative bilat Roll Test: negative bilat  Comments: pt endorses different feeling of dizziness but no longer vertigo   Orthostatics: Supine- 100/68 mmHg, HR 73 bpm Seated-103/66 mmHg HR 76 bpm Dizziness with sitting up  Standing-  97/58 mmHg  HR 84 bpm Pt reports a head rush feeling   OCULOMOTOR EXAM:   Ocular ROM: WNL but pt reports some eye mm pain with L gaze   Spontaneous Nystagmus: absent  Gaze-Induced Nystagmus: absent   Smooth Pursuits: WNL but does make pt feel a little motion sick   Saccades: occ corrective saccade to the L  Convergence/Divergence: WNL but does make pt somewhat motion sick    VESTIBULAR - OCULAR REFLEX:  deferred to future  visit/to be completed as indicate  Slow VOR:  WNL  VOR Cancellation:  movement WNL - but reports really slight motion sickness (reports she has had this entire life)  Head-Impulse Test: R WNL, POSITIVE  L head impulse test (pt reports this is side she had ear infection on)   PT instructed pt in HEP in session, pt completes sets and reps as prescribed below: Access Code: [email or web address] URL: [email or web address] Date: 07/14/2023 Prepared by: Darryle Patten  Exercises - Seated Gaze Stabilization with Head Rotation  - 7 x weekly - 3 sets - 1 reps - 20 seconds hold   PATIENT EDUCATION: Education details: Assessment findings, recommends pt contact her dr regarding continuing symptoms, head-rush and BP, HEP Person educated: Patient Education method: Explanation and Handouts Education comprehension: verbalized understanding  HOME EXERCISE PROGRAM: Access Code: [email or web address] URL: [email or web address] Date: 07/14/2023 Prepared by: Darryle Patten  Exercises - Seated Gaze Stabilization with Head Rotation  - 7 x weekly - 3 sets - 1 reps - 20 seconds hold  GOALS: Goals reviewed with patient? Yes   SHORT TERM GOALS: Target date: 08/25/2023   Patient will be independent in home exercise program to improve dizziness and balance/mobility for better functional independence with ADLs. Baseline: Goal status: INITIAL   LONG TERM GOALS: Target date: 10/06/2023    1.  Patient will reduce dizziness handicap inventory score to <30, for less dizziness with ADLs and increased safety with home and work tasks.  Baseline: 44 Goal status: INITIAL  2.  Patient will demonstrate negative right Dix-Hallpike and report no dizziness with bed mobility to indicate resolution of symptoms Baseline: test currently positive, causes vertigo  Goal status: INITIAL  3.  Patient will increase dynamic gait index score to >19/24 as to demonstrate reduced fall risk and improved dynamic gait balance for  better safety with community/home ambulation.   Baseline:  Goal status: INITIAL   ASSESSMENT:  CLINICAL IMPRESSION: Pt with negative positional testing today. Orthostatics completed and pt found to have 10  point decrease in diastolic BP when comparing supine position to standing position. PT encourages pt to follow-up with her physician regarding her BP, since she also reported dizziness with changes in position as well. Pt found to have positive L head impulse test, VORx1 training initiated to address this deficit.  The pt will benefit from further skilled PT to improve dizziness and imbalance/gait-related dizziness impairment in order to increase ease and safety with ADLS and return pt to PLOF.  OBJECTIVE IMPAIRMENTS: decreased activity tolerance, decreased balance, difficulty walking, dizziness, and pain.   ACTIVITY LIMITATIONS: bending, stairs, bed mobility, locomotion level, and general ADLs/mobility can be affected when pt having dizzy symptoms/dizzy episode  PARTICIPATION LIMITATIONS: meal prep, cleaning, driving, shopping, community activity, and yard workgeneral ADLs/mobility can be affected when pt having dizzy symptoms/dizzy episode  PERSONAL FACTORS: Sex, Time since onset of injury/illness/exacerbation, and 3+ comorbidities: PMH - ADHD, bipolar 1, COPD, gastric ulcer, OCD, pancreatitis, PTSD, vertigo, hx of pancreatitis, pt also reports hx of multiple concussions (one requiring hospitalization and needing to relearn to walk) are also affecting patient's functional outcome.   REHAB POTENTIAL: Good  CLINICAL DECISION MAKING: Evolving/moderate complexity  EVALUATION COMPLEXITY: Moderate   PLAN:  PT FREQUENCY: 1-2x/week  PT DURATION: 12 weeks  PLANNED INTERVENTIONS: 97164- PT Re-evaluation, 97750- Physical Performance Testing, 97110-Therapeutic exercises, 97530- Therapeutic activity, 97112- Neuromuscular re-education, 97535- Self Care, 02859- Manual therapy, [phone]- Gait  training, [phone]- Orthotic Initial, [phone]- Canalith repositioning, Patient/Family education, Balance training, Stair training, Taping, Joint mobilization, Spinal mobilization, Vestibular training, DME instructions, Cryotherapy, and Moist heat  PLAN FOR NEXT SESSION: advance HEP if indicated, DGI, habituation    Darryle JONELLE Patten, PT 07/14/2023, 8:44 AM

## 2023-07-20 ENCOUNTER — Ambulatory Visit: Payer: MEDICAID

## 2023-07-22 ENCOUNTER — Ambulatory Visit: Payer: MEDICAID

## 2023-07-25 NOTE — Progress Notes (Unsigned)
 Ziglar, Susan K, MD   No chief complaint on file.   HPI:      [name] is a 43 y.o. [id number] whose LMP was No LMP recorded. (Menstrual status: Bilateral Tubal Ligation)., presents today for f/u RTO cyst found on CT 5/25.   3.2 cm RTO cyst on 5/25 CT abd pelvis due to pelvic pain   Patient Active Problem List   Diagnosis Date Noted   COPD (chronic obstructive pulmonary disease) (HCC) 06/03/2023   Skin lesion of face 06/01/2023   Benign paroxysmal positional vertigo 06/01/2023   Trichomoniasis 09/08/2022   Cervical dysplasia 09/03/2022   Polysubstance abuse (HCC) 05/09/2022   Substance induced mood disorder (HCC) 05/09/2022   Cocaine use disorder, moderate, dependence (HCC) 05/09/2022   Passive suicidal ideations 05/09/2022   Leukocytosis 11/24/2021   NSTEMI (non-ST elevated myocardial infarction) (HCC) 11/24/2021   Tobacco abuse 11/24/2021   Gastric ulcer 11/24/2021   Gastroenteritis 11/24/2021   Bipolar I disorder, most recent episode depressed (HCC)    Alcohol use disorder, severe, dependence (HCC)    Cannabis use disorder, severe, dependence (HCC)     Past Surgical History:  Procedure Laterality Date   BREAST ENHANCEMENT SURGERY     CESAREAN SECTION     COSMETIC SURGERY     LEFT HEART CATH AND CORONARY ANGIOGRAPHY N/A 11/24/2021   Procedure: LEFT HEART CATH AND CORONARY ANGIOGRAPHY;  Surgeon: Darron Deatrice LABOR, MD;  Location: [facility] INVASIVE CV LAB;  Service: Cardiovascular;  Laterality: N/A;   TUBAL LIGATION      Family History  Problem Relation Age of Onset   Healthy Mother    Other Father        unknown medical history   Breast cancer Maternal Grandmother 14   Skin cancer Maternal Grandfather        37s    Social History   Socioeconomic History   Marital status: Divorced    Spouse name: Not on file   Number of children: Not on file   Years of education: Not on file   Highest education level: GED or equivalent  Occupational History   Not  on file  Tobacco Use   Smoking status: Former    Types: Cigarettes    Passive exposure: Past   Smokeless tobacco: Never  Vaping Use   Vaping status: Never Used  Substance and Sexual Activity   Alcohol use: Not Currently    Alcohol/week: 6.0 standard drinks of alcohol    Types: 6 Cans of beer per week   Drug use: Yes    Types: Marijuana   Sexual activity: Yes    Birth control/protection: Surgical    Comment: Tubal ligation  Other Topics Concern   Not on file  Social History Narrative   Not on file   Social Drivers of Health   Financial Resource Strain: Low Risk  (05/31/2023)   Overall Financial Resource Strain (CARDIA)    Difficulty of Paying Living Expenses: Not hard at all  Food Insecurity: No Food Insecurity (05/31/2023)   Hunger Vital Sign    Worried About Running Out of Food in the Last Year: Never true    Ran Out of Food in the Last Year: Never true  Transportation Needs: No Transportation Needs (05/31/2023)   PRAPARE - Administrator, Civil Service (Medical): No    Lack of Transportation (Non-Medical): No  Physical Activity: Unknown (05/31/2023)   Exercise Vital Sign    Days of Exercise per Week: 0  days    Minutes of Exercise per Session: Not on file  Stress: Stress Concern Present (05/31/2023)   [employer] of Occupational Health - Occupational Stress Questionnaire    Feeling of Stress : Rather much  Social Connections: Moderately Isolated (05/31/2023)   Social Connection and Isolation Panel    Frequency of Communication with Friends and Family: More than three times a week    Frequency of Social Gatherings with Friends and Family: Never    Attends Religious Services: Never    Database administrator or Organizations: No    Attends Engineer, structural: Not on file    Marital Status: Married  Catering manager Violence: Not At Risk (05/09/2022)   Humiliation, Afraid, Rape, and Kick questionnaire    Fear of Current or Ex-Partner: No     Emotionally Abused: No    Physically Abused: No    Sexually Abused: No    Outpatient Medications Prior to Visit  Medication Sig Dispense Refill   albuterol  (PROVENTIL ) (2.5 MG/3ML) 0.083% nebulizer solution Take 3 mLs (2.5 mg total) by nebulization every 6 (six) hours as needed for wheezing or shortness of breath. 150 mL 1   albuterol  (VENTOLIN  HFA) 108 (90 Base) MCG/ACT inhaler Inhale 2 puffs into the lungs every 6 (six) hours as needed for wheezing or shortness of breath. 8 g 2   EPINEPHrine  0.3 mg/0.3 mL IJ SOAJ injection Inject 0.3 mg into the muscle as needed for anaphylaxis. 1 each 12   meclizine  (ANTIVERT ) 25 MG tablet Take 1 tablet (25 mg total) by mouth 3 (three) times daily as needed. 30 tablet 0   methocarbamol  (ROBAXIN ) 500 MG tablet Take 2 tablets (1,000 mg total) by mouth 3 (three) times daily. 180 tablet 1   ondansetron  (ZOFRAN ) 4 MG tablet Take 1 tablet (4 mg total) by mouth every 8 (eight) hours as needed. 15 tablet 0   Semaglutide,0.25 or 0.5MG /DOS, (OZEMPIC, 0.25 OR 0.5 MG/DOSE,) 2 MG/1.5ML SOPN Inject 0.7 mg into the skin once a week.     valACYclovir  (VALTREX ) 500 MG tablet Take 1 tablet (500 mg total) by mouth daily. 90 tablet 3   No facility-administered medications prior to visit.      ROS:  Review of Systems BREAST: No symptoms   OBJECTIVE:   Vitals:  There were no vitals taken for this visit.  Physical Exam  Results: No results found for this or any previous visit (from the past 24 hours).   Assessment/Plan: No diagnosis found.    No orders of the defined types were placed in this encounter.     No follow-ups on file.  Tayveon Lombardo B. Jaymes Hang, PA-C 07/25/2023 9:57 PM

## 2023-07-27 ENCOUNTER — Encounter: Payer: Self-pay | Admitting: Obstetrics and Gynecology

## 2023-07-27 ENCOUNTER — Ambulatory Visit: Payer: MEDICAID

## 2023-07-27 ENCOUNTER — Ambulatory Visit: Payer: MEDICAID | Admitting: Obstetrics and Gynecology

## 2023-07-27 VITALS — BP 102/65 | HR 82 | Ht 69.0 in | Wt 181.0 lb

## 2023-07-27 DIAGNOSIS — N921 Excessive and frequent menstruation with irregular cycle: Secondary | ICD-10-CM

## 2023-07-27 DIAGNOSIS — N83201 Unspecified ovarian cyst, right side: Secondary | ICD-10-CM

## 2023-07-27 NOTE — Patient Instructions (Signed)
 I value your feedback and you entrusting [place] with your care. If you get a [place] patient survey, I would appreciate you taking the time to let us know about your experience today. Thank you! ? ? ?

## 2023-07-29 ENCOUNTER — Ambulatory Visit: Payer: MEDICAID

## 2023-08-03 ENCOUNTER — Ambulatory Visit: Payer: MEDICAID | Admitting: Physical Therapy

## 2023-08-03 ENCOUNTER — Ambulatory Visit: Payer: MEDICAID

## 2023-08-04 ENCOUNTER — Ambulatory Visit: Payer: MEDICAID

## 2023-08-04 ENCOUNTER — Encounter: Payer: Self-pay | Admitting: Obstetrics and Gynecology

## 2023-08-04 DIAGNOSIS — N921 Excessive and frequent menstruation with irregular cycle: Secondary | ICD-10-CM

## 2023-08-04 DIAGNOSIS — N83201 Unspecified ovarian cyst, right side: Secondary | ICD-10-CM | POA: Diagnosis not present

## 2023-08-05 ENCOUNTER — Ambulatory Visit: Payer: MEDICAID

## 2023-08-06 ENCOUNTER — Ambulatory Visit: Payer: MEDICAID | Admitting: Physical Therapy

## 2023-08-10 ENCOUNTER — Ambulatory Visit: Payer: MEDICAID

## 2023-08-11 ENCOUNTER — Ambulatory Visit: Payer: MEDICAID

## 2023-08-16 ENCOUNTER — Ambulatory Visit: Payer: MEDICAID

## 2023-08-18 ENCOUNTER — Ambulatory Visit: Payer: MEDICAID

## 2023-08-19 ENCOUNTER — Ambulatory Visit: Payer: MEDICAID

## 2023-08-23 ENCOUNTER — Ambulatory Visit: Payer: MEDICAID

## 2023-08-25 ENCOUNTER — Ambulatory Visit: Payer: MEDICAID

## 2023-09-03 ENCOUNTER — Ambulatory Visit: Payer: MEDICAID | Admitting: Family Medicine

## 2023-11-29 ENCOUNTER — Ambulatory Visit: Payer: MEDICAID

## 2023-11-29 ENCOUNTER — Ambulatory Visit
Admission: RE | Admit: 2023-11-29 | Discharge: 2023-11-29 | Disposition: A | Discharge status: Home / Self Care | Payer: MEDICAID | Attending: Emergency Medicine | Admitting: Emergency Medicine

## 2023-11-29 ENCOUNTER — Ambulatory Visit
Admission: EM | Admit: 2023-11-29 | Discharge: 2023-11-29 | Disposition: A | Discharge status: Home / Self Care | Payer: MEDICAID | Attending: Emergency Medicine | Admitting: Emergency Medicine

## 2023-11-29 ENCOUNTER — Ambulatory Visit
Admission: RE | Admit: 2023-11-29 | Discharge: 2023-11-29 | Disposition: A | Discharge status: Home / Self Care | Payer: MEDICAID | Source: Ambulatory Visit | Attending: Emergency Medicine | Admitting: Emergency Medicine

## 2023-11-29 DIAGNOSIS — R058 Other specified cough: Secondary | ICD-10-CM | POA: Diagnosis not present

## 2023-11-29 DIAGNOSIS — J441 Chronic obstructive pulmonary disease with (acute) exacerbation: Secondary | ICD-10-CM

## 2023-11-29 MED ORDER — PREDNISONE 10 MG PO TABS: 40.0000 mg | ORAL_TABLET | Freq: Every day | ORAL | 0 refills | Status: AC

## 2023-11-29 MED ORDER — DOXYCYCLINE HYCLATE 100 MG PO CAPS: 100.0000 mg | ORAL_CAPSULE | Freq: Two times a day (BID) | ORAL | 0 refills | Status: AC

## 2023-11-29 MED ORDER — ALBUTEROL SULFATE (2.5 MG/3ML) 0.083% IN NEBU
2.5000 mg | INHALATION_SOLUTION | Freq: Once | RESPIRATORY_TRACT | Status: AC
  Administered 2023-11-29: 2.5 mg via RESPIRATORY_TRACT

## 2023-11-29 NOTE — ED Triage Notes (Addendum)
 Patient to Urgent Care with complaints of shortness of breath/ wheezing/ fevers/ poor appetite.   Reports symptoms started Friday.  Using albuterol  breathing treatments. Reports last treatment at 5am.

## 2023-11-29 NOTE — Discharge Instructions (Addendum)
 Follow up with your primary care provider tomorrow.  Go to the emergency department if you have worsening symptoms.    Continue using your albuterol  inhaler or nebulizer treatments as directed.  Take the doxycycline  and prednisone  as directed.    Go to [facility] for your x-ray.    [address]  [place], [place] [address] [phone]

## 2023-11-29 NOTE — ED Provider Notes (Signed)
 [name]    CSN: [id number] Arrival date & time: 11/29/23  9188      History   Chief Complaint Chief Complaint  Patient presents with   Fever    HPI Rebecca Duffy is a 43 y.o. female.  Patient presents with 4-day history of subjective fever, cough, wheezing, shortness of breath, chest tightness with cough.  She has been treating her symptoms with albuterol  nebulizer treatments; last done at 0500 this morning.  Her medical history includes COPD.  The history is provided by the patient and medical records.    Past Medical History:  Diagnosis Date   ADHD    Allergy    Bipolar 1 disorder (HCC)    COPD (chronic obstructive pulmonary disease) (HCC)    Gastric ulcer    GERD (gastroesophageal reflux disease)    OCD (obsessive compulsive disorder)    Pancreatitis    PTSD (post-traumatic stress disorder)    Vertigo     Patient Active Problem List   Diagnosis Date Noted   COPD (chronic obstructive pulmonary disease) (HCC) 06/03/2023   Skin lesion of face 06/01/2023   Benign paroxysmal positional vertigo 06/01/2023   Trichomoniasis 09/08/2022   Cervical dysplasia 09/03/2022   Polysubstance abuse (HCC) 05/09/2022   Substance induced mood disorder (HCC) 05/09/2022   Cocaine use disorder, moderate, dependence (HCC) 05/09/2022   Passive suicidal ideations 05/09/2022   Leukocytosis 11/24/2021   NSTEMI (non-ST elevated myocardial infarction) (HCC) 11/24/2021   Tobacco abuse 11/24/2021   Gastric ulcer 11/24/2021   Gastroenteritis 11/24/2021   Bipolar I disorder, most recent episode depressed (HCC)    Alcohol use disorder, severe, dependence (HCC)    Cannabis use disorder, severe, dependence (HCC)     Past Surgical History:  Procedure Laterality Date   BREAST ENHANCEMENT SURGERY     CESAREAN SECTION     COSMETIC SURGERY     LEFT HEART CATH AND CORONARY ANGIOGRAPHY N/A 11/24/2021   Procedure: LEFT HEART CATH AND CORONARY ANGIOGRAPHY;  Surgeon: Darron Deatrice LABOR, MD;  Location: [facility] INVASIVE CV LAB;  Service: Cardiovascular;  Laterality: N/A;   TUBAL LIGATION      OB History     Gravida  10   Para  2   Term  1   Preterm  1   AB  8   Living  2      SAB  2   IAB  6   Ectopic      Multiple      Live Births  2            Home Medications    Prior to Admission medications   Medication Sig Start Date End Date Taking? Authorizing Provider  albuterol  (PROVENTIL ) (2.5 MG/3ML) 0.083% nebulizer solution Take 3 mLs (2.5 mg total) by nebulization every 6 (six) hours as needed for wheezing or shortness of breath. 07/05/23  Yes Ziglar, Susan K, MD  doxycycline  (VIBRAMYCIN ) 100 MG capsule Take 1 capsule (100 mg total) by mouth 2 (two) times daily for 7 days. 11/29/23 12/06/23 Yes Corlis Burnard DEL, NP  predniSONE  (DELTASONE ) 10 MG tablet Take 4 tablets (40 mg total) by mouth daily for 5 days. 11/29/23 12/04/23 Yes Corlis Burnard DEL, NP  albuterol  (VENTOLIN  HFA) 108 (90 Base) MCG/ACT inhaler Inhale 2 puffs into the lungs every 6 (six) hours as needed for wheezing or shortness of breath. 07/05/23   Ziglar, Susan K, MD  EPINEPHrine  0.3 mg/0.3 mL IJ SOAJ injection Inject 0.3 mg into the  muscle as needed for anaphylaxis. 07/05/23   Ziglar, Susan K, MD  prazosin  (MINIPRESS ) 1 MG capsule Take by mouth. Patient not taking: Reported on 11/29/2023 07/05/23   [provider]  Semaglutide,0.25 or 0.5MG /DOS, (OZEMPIC, 0.25 OR 0.5 MG/DOSE,) 2 MG/1.5ML SOPN Inject 0.7 mg into the skin once a week. Patient not taking: Reported on 11/29/2023    [provider]  valACYclovir  (VALTREX ) 500 MG tablet Take 1 tablet (500 mg total) by mouth daily. 10/05/22   Copland, Bernarda NOVAK, PA-C    Family History Family History  Problem Relation Age of Onset   Healthy Mother    Other Father        unknown medical history   Breast cancer Maternal Grandmother 11   Skin cancer Maternal Grandfather        43s    Social History Social History   Tobacco Use    Smoking status: Former    Types: Cigarettes    Passive exposure: Past   Smokeless tobacco: Never  Vaping Use   Vaping status: Never Used  Substance Use Topics   Alcohol use: Not Currently    Alcohol/week: 6.0 standard drinks of alcohol    Types: 6 Cans of beer per week   Drug use: Yes    Types: Marijuana     Allergies   Latex, Penicillins, Nsaids, and Compazine  [prochlorperazine ]   Review of Systems Review of Systems  Constitutional:  Positive for fever. Negative for chills.  HENT:  Negative for ear pain and sore throat.   Respiratory:  Positive for cough, chest tightness, shortness of breath and wheezing.   Cardiovascular:  Negative for chest pain and palpitations.     Physical Exam Triage Vital Signs ED Triage Vitals  Encounter Vitals Group     BP 11/29/23 0817 128/76     Girls Systolic BP Percentile --      Girls Diastolic BP Percentile --      Boys Systolic BP Percentile --      Boys Diastolic BP Percentile --      Pulse Rate 11/29/23 0817 (!) 103     Resp 11/29/23 0817 (!) 25     Temp 11/29/23 0817 97.9 F (36.6 C)     Temp src --      SpO2 11/29/23 0817 99 %     Weight --      Height --      Head Circumference --      Peak Flow --      Pain Score 11/29/23 0818 6     Pain Loc --      Pain Education --      Exclude from Growth Chart --    No data found.  Updated Vital Signs BP 128/76   Pulse (!) 103   Temp 97.9 F (36.6 C)   Resp (!) 25   SpO2 99%   Visual Acuity Right Eye Distance:   Left Eye Distance:   Bilateral Distance:    Right Eye Near:   Left Eye Near:    Bilateral Near:     Physical Exam Constitutional:      General: She is not in acute distress. HENT:     Right Ear: Tympanic membrane normal.     Left Ear: Tympanic membrane normal.     Nose: Nose normal.     Mouth/Throat:     Mouth: Mucous membranes are moist.     Pharynx: Oropharynx is clear.  Cardiovascular:     Rate  and Rhythm: Normal rate and regular rhythm.     Heart  sounds: Normal heart sounds.  Pulmonary:     Effort: Pulmonary effort is normal.     Breath sounds: Wheezing present.     Comments: Few faint expiratory wheezes.  No respiratory distress. Neurological:     Mental Status: She is alert.      UC Treatments / Results  Labs (all labs ordered are listed, but only abnormal results are displayed) Labs Reviewed - No data to display  EKG   Radiology DG Chest 2 View Result Date: 11/29/2023 CLINICAL DATA:  COPD EXAM: CHEST - 2 VIEW COMPARISON:  Chest x-ray performed 10/08/80 FINDINGS: The heart size and mediastinal contours are within normal limits. Both lungs are clear. The visualized skeletal structures are unremarkable. Breast prosthetics. IMPRESSION: No active cardiopulmonary disease.  No evidence of lobar infiltrate. Electronically Signed   By: Maude Naegeli M.D.   On: 11/29/2023 09:44    Procedures Procedures (including critical care time)  Medications Ordered in UC Medications  albuterol  (PROVENTIL ) (2.5 MG/3ML) 0.083% nebulizer solution 2.5 mg (2.5 mg Nebulization Given 11/29/23 0846)    Initial Impression / Assessment and Plan / UC Course  I have reviewed the triage vital signs and the nursing notes.  Pertinent labs & imaging results that were available during my care of the patient were reviewed by me and considered in my medical decision making (see chart for details).    COPD exacerbation, productive cough.  O2 sat 99% on room air, no respiratory distress.  Albuterol  nebulizer treatment given here per patient request for shortness of breath and chest tightness with cough.  After nebulizer treatment, no wheezes noted and patient reports improvement in her shortness of breath and chest tightness.  CXR negative.  Treating today with doxycycline  and prednisone .  Instructed patient to continue using her albuterol  nebulizer treatment or inhaler as directed.  Instructed her to follow-up with her PCP tomorrow.  ED precautions  given.  She agrees to plan of care.  Final Clinical Impressions(s) / UC Diagnoses   Final diagnoses:  COPD exacerbation (HCC)  Productive cough     Discharge Instructions      Follow up with your primary care provider tomorrow.  Go to the emergency department if you have worsening symptoms.    Continue using your albuterol  inhaler or nebulizer treatments as directed.  Take the doxycycline  and prednisone  as directed.    Go to [facility] for your x-ray.    [address]  [address] [place], [place] [address] [phone]       ED Prescriptions     Medication Sig Dispense Auth. Provider   doxycycline  (VIBRAMYCIN ) 100 MG capsule Take 1 capsule (100 mg total) by mouth 2 (two) times daily for 7 days. 14 capsule Corlis Burnard DEL, NP   predniSONE  (DELTASONE ) 10 MG tablet Take 4 tablets (40 mg total) by mouth daily for 5 days. 20 tablet Corlis Burnard DEL, NP      PDMP not reviewed this encounter.   Corlis Burnard DEL, NP 11/29/23 [id number]

## 2023-11-30 ENCOUNTER — Ambulatory Visit (HOSPITAL_COMMUNITY): Payer: Self-pay

## 2023-12-01 ENCOUNTER — Telehealth: Payer: Self-pay | Admitting: Emergency Medicine

## 2023-12-01 NOTE — Telephone Encounter (Signed)
 Duplicate, error

## 2023-12-01 NOTE — Telephone Encounter (Signed)
 Patient called clinic this morning endorses that she is not feeling any better, was seen on 11/29/2023 in this urgent care diagnosed with a COPD exacerbation, has been started on antibiotics and steroids and chest x-ray completed, patient instructed that she will need to go to the nearest emergency department for further evaluation and management, verbalized understand

## 2023-12-23 ENCOUNTER — Other Ambulatory Visit: Payer: Self-pay | Admitting: Obstetrics and Gynecology

## 2023-12-23 DIAGNOSIS — A6004 Herpesviral vulvovaginitis: Secondary | ICD-10-CM
